# Patient Record
Sex: Male | Born: 1955 | Race: White | Hispanic: No | Marital: Married | Smoking: Never smoker
Health system: Southern US, Community
[De-identification: ages and names within clinical notes are randomized; demographics above are authoritative.]

## PROBLEM LIST (undated history)

## (undated) DIAGNOSIS — E119 Type 2 diabetes mellitus without complications: Secondary | ICD-10-CM

## (undated) DIAGNOSIS — I2510 Atherosclerotic heart disease of native coronary artery without angina pectoris: Secondary | ICD-10-CM

## (undated) DIAGNOSIS — I4729 Other ventricular tachycardia: Secondary | ICD-10-CM

## (undated) DIAGNOSIS — E785 Hyperlipidemia, unspecified: Secondary | ICD-10-CM

## (undated) DIAGNOSIS — I10 Essential (primary) hypertension: Secondary | ICD-10-CM

## (undated) DIAGNOSIS — R911 Solitary pulmonary nodule: Secondary | ICD-10-CM

## (undated) DIAGNOSIS — I472 Ventricular tachycardia: Secondary | ICD-10-CM

## (undated) DIAGNOSIS — F845 Asperger's syndrome: Secondary | ICD-10-CM

## (undated) DIAGNOSIS — C6290 Malignant neoplasm of unspecified testis, unspecified whether descended or undescended: Secondary | ICD-10-CM

## (undated) DIAGNOSIS — N189 Chronic kidney disease, unspecified: Secondary | ICD-10-CM

## (undated) DIAGNOSIS — E11319 Type 2 diabetes mellitus with unspecified diabetic retinopathy without macular edema: Secondary | ICD-10-CM

## (undated) HISTORY — PX: CARDIAC CATHETERIZATION: SHX172

## (undated) HISTORY — PX: RADICAL ORCHIECTOMY: SHX2285

## (undated) HISTORY — PX: CORONARY ARTERY BYPASS GRAFT: SHX141

---

## 2016-08-10 HISTORY — PX: COLONOSCOPY W/ BIOPSIES: SHX1374

## 2017-03-12 DIAGNOSIS — R911 Solitary pulmonary nodule: Secondary | ICD-10-CM

## 2017-03-12 HISTORY — DX: Solitary pulmonary nodule: R91.1

## 2017-04-17 DIAGNOSIS — E11319 Type 2 diabetes mellitus with unspecified diabetic retinopathy without macular edema: Secondary | ICD-10-CM

## 2017-04-17 HISTORY — DX: Type 2 diabetes mellitus with unspecified diabetic retinopathy without macular edema: E11.319

## 2017-05-29 ENCOUNTER — Telehealth (HOSPITAL_COMMUNITY): Payer: Self-pay

## 2017-05-29 NOTE — Telephone Encounter (Signed)
Attempted to call patient in regards to insurance and filling out more demographic information - lm on vm.

## 2017-06-04 ENCOUNTER — Encounter (HOSPITAL_COMMUNITY): Payer: Self-pay

## 2017-06-04 ENCOUNTER — Telehealth (HOSPITAL_COMMUNITY): Payer: Self-pay

## 2017-06-04 NOTE — Telephone Encounter (Signed)
2nd attempt to call patient to follow up in regards to insurance - lm on vm. Sending letter.

## 2017-06-06 ENCOUNTER — Telehealth (HOSPITAL_COMMUNITY): Payer: Self-pay

## 2017-06-06 NOTE — Telephone Encounter (Signed)
Patients insurance is active and benefits verified through [employer] - No co-pay, deductible amount of $6,000/$6,000 has been met, out of pocket amount of $6,400/$6,400 has been met, 40% co-insurance, and no pre-authorization is required. Passport/reference [phone]

## 2017-06-06 NOTE — Telephone Encounter (Signed)
Patient returned phone call and has insurance through [employer]. Got demographic information from patient. Explained our RN Navigator will review and will proceed with scheduling.

## 2017-06-09 ENCOUNTER — Telehealth (HOSPITAL_COMMUNITY): Payer: Self-pay

## 2017-06-09 NOTE — Telephone Encounter (Signed)
Called to speak with patient in regards to cardiologist - patient stated he will not be establishing a cardiologist here. He will be keeping Dr.Patterson in [place]. I explained to patient that he will need to set up an appt with his Cardiologist before we can schedule for CR. Patient will call back with appt date. Patient paperwork is in follow up appt folder.

## 2017-07-07 ENCOUNTER — Telehealth (HOSPITAL_COMMUNITY): Payer: Self-pay

## 2017-07-07 NOTE — Telephone Encounter (Signed)
Patient called to schedule for Cardiac Rehab - scheduled orientation on 08/14/2017 at 7:30am. Patient will attend the 6:45am exc class. Mailed packet.

## 2017-08-12 ENCOUNTER — Telehealth (HOSPITAL_COMMUNITY): Payer: Self-pay | Admitting: Pharmacist

## 2017-08-13 NOTE — Telephone Encounter (Signed)
Cardiac Rehab - Pharmacy Resident Documentation   Patient unable to be reached after three call attempts. Please complete allergy verification and medication review during patient's cardiac rehab appointment.    I did reconcile his medications from [facility]. He has amlodipine, aspirin, atorvastatin, insulin NPH and metoprolol in dispensing records, these need to be verified with the patient. Thank you!  Charlene Brooke, PharmD PGY1 Pharmacy Resident Email: [email or web address] Pager: [phone]

## 2017-08-14 ENCOUNTER — Encounter (HOSPITAL_COMMUNITY)
Admission: RE | Admit: 2017-08-14 | Discharge: 2017-08-14 | Disposition: A | Discharge status: Home / Self Care | Payer: Managed Care, Other (non HMO) | Source: Ambulatory Visit | Attending: Cardiology | Admitting: Cardiology

## 2017-08-14 ENCOUNTER — Encounter (HOSPITAL_COMMUNITY): Payer: Self-pay

## 2017-08-14 VITALS — Ht 67.5 in | Wt 181.4 lb

## 2017-08-14 DIAGNOSIS — E11319 Type 2 diabetes mellitus with unspecified diabetic retinopathy without macular edema: Secondary | ICD-10-CM | POA: Insufficient documentation

## 2017-08-14 DIAGNOSIS — Z951 Presence of aortocoronary bypass graft: Secondary | ICD-10-CM | POA: Insufficient documentation

## 2017-08-14 HISTORY — DX: Other ventricular tachycardia: I47.29

## 2017-08-14 HISTORY — DX: Hyperlipidemia, unspecified: E78.5

## 2017-08-14 HISTORY — DX: Chronic kidney disease, unspecified: N18.9

## 2017-08-14 HISTORY — DX: Type 2 diabetes mellitus without complications: E11.9

## 2017-08-14 HISTORY — DX: Type 2 diabetes mellitus with unspecified diabetic retinopathy without macular edema: E11.319

## 2017-08-14 HISTORY — DX: Asperger's syndrome: F84.5

## 2017-08-14 HISTORY — DX: Essential (primary) hypertension: I10

## 2017-08-14 HISTORY — DX: Solitary pulmonary nodule: R91.1

## 2017-08-14 HISTORY — DX: Ventricular tachycardia: I47.2

## 2017-08-14 HISTORY — DX: Atherosclerotic heart disease of native coronary artery without angina pectoris: I25.10

## 2017-08-14 NOTE — Progress Notes (Signed)
Lucas Hobbs 62 y.o. male DOB 04-Mar-1956 MRN [id number]       Nutrition  1. S/P CABG x 2    Past Medical History:  Diagnosis Date  . Asperger's syndrome   . Chronic kidney disease    CKD Stage 3 05/08/2017  . Coronary artery disease   . Diabetes mellitus without complication (HCC)    IDDM  . Diabetic retinopathy ([place])   . Diabetic retinopathy ([place])   . Diabetic retinopathy ([place]) 04/17/2017  . Hyperlipidemia    mixed  . Hypertension   . NSVT (nonsustained ventricular tachycardia) ([place])   . Pulmonary nodule 03/12/2017   Meds reviewed. Humulin noted  HT: Ht Readings from Last 1 Encounters:  08/14/17 5' 7.5" (1.715 m)    WT: Wt Readings from Last 3 Encounters:  08/14/17 181 lb 7 oz (82.3 kg)     BMI Body mass index is 28 kg/m.    Current tobacco use? No, never a smoker Labs:  Lipid Panel  No results found for: CHOL, TRIG, HDL, CHOLHDL, VLDL, LDLCALC, LDLDIRECT  No results found for: HGBA1C 05/05/17: 8.0% ([facility])  CBG (last 3)  No results for input(s): GLUCAP in the last 72 hours.  Nutrition Diagnosis ? Food-and nutrition-related knowledge deficit related to lack of exposure to information as related to diagnosis of: ? CVD ? DM  ? Overweight related to excessive energy intake as evidenced by a BMI of 28.0  Nutrition Goal(s):  ? Pt to identify food quantities necessary to achieve weight loss of 6-24 lb (2.7-10.9 kg) at graduation from cardiac rehab. 10 lb weight loss is desired. ? Pt to describe the potential benefits of adopting Therapeutic Lifestyle Changes ? CBG concentrations in the normal range or as close to normal as is safely possible. ? Improved blood glucose control as evidenced by pt's A1c trending from 8.0 toward less than 7.0.  Plan:  Pt to attend nutrition classes ? Nutrition I ? Nutrition II ? Portion Distortion ? Diabetes Blitz ? Diabetes Q & A Will provide client-centered nutrition education as part of interdisciplinary care.   Monitor  and evaluate progress toward nutrition goal with team.  Ranell Patrick, Dietetic Intern 08/14/2017 10:31 AM

## 2017-08-14 NOTE — Progress Notes (Signed)
Cardiac Individual Treatment Plan  Patient Details  Name: Lucas Hobbs MRN: [id number] Date of Birth: Aug 31, 1955 Referring Provider:     CARDIAC REHAB PHASE II ORIENTATION from 08/14/2017 in [place]  Referring Provider  Sueanne Margarita MD       Initial Encounter Date:    CARDIAC REHAB PHASE II ORIENTATION from 08/14/2017 in [facility]  Date  08/14/17  Referring Provider  Sueanne Margarita MD       Visit Diagnosis: S/P CABG x 2  Patient's Home Medications on Admission:  Current Outpatient Medications:  .  amLODipine (NORVASC) 10 MG tablet, Take 10 mg by mouth daily., Disp: , Rfl: 2 .  aspirin 81 MG chewable tablet, Chew 81 mg by mouth daily., Disp: , Rfl:  .  atorvastatin (LIPITOR) 40 MG tablet, Take 40 mg by mouth daily., Disp: , Rfl: 11 .  insulin NPH-regular Human (HUMULIN 70/30) (70-30) 100 UNIT/ML injection, Inject 36 Units into the skin daily., Disp: , Rfl:  .  metoprolol succinate (TOPROL-XL) 100 MG 24 hr tablet, Take 100 mg by mouth daily., Disp: , Rfl:   Past Medical History: Past Medical History:  Diagnosis Date  . Asperger's syndrome   . Chronic kidney disease    CKD Stage 3 05/08/2017  . Coronary artery disease   . Diabetes mellitus without complication (HCC)    IDDM  . Diabetic retinopathy ([place])   . Diabetic retinopathy ([place])   . Diabetic retinopathy ([place]) 04/17/2017  . Hyperlipidemia    mixed  . Hypertension   . NSVT (nonsustained ventricular tachycardia) ([place])   . Pulmonary nodule 03/12/2017    Tobacco Use: Social History   Tobacco Use  Smoking Status Never Smoker  Smokeless Tobacco Never Used    Labs: Recent Review Flowsheet Data    There is no flowsheet data to display.      Capillary Blood Glucose: No results found for: GLUCAP   Exercise Target Goals: Date: 08/14/17  Exercise Program Goal: Individual exercise prescription set using results from initial 6 min walk test and  THRR while considering  patient's activity barriers and safety.   Exercise Prescription Goal: Initial exercise prescription builds to 30-45 minutes a day of aerobic activity, 2-3 days per week.  Home exercise guidelines will be given to patient during program as part of exercise prescription that the participant will acknowledge.  Activity Barriers & Risk Stratification: Activity Barriers & Cardiac Risk Stratification - 08/14/17 0843      Activity Barriers & Cardiac Risk Stratification   Activity Barriers  Other (comment)    Comments  Aspergers     Cardiac Risk Stratification  High       6 Minute Walk: 6 Minute Walk    Row Name 08/14/17 0842         6 Minute Walk   Phase  Initial     Distance  1800 feet     Walk Time  6 minutes     # of Rest Breaks  0     MPH  3.41     METS  4.34     RPE  11     Perceived Dyspnea   0     VO2 Peak  15.19     Symptoms  No     Resting HR  96 bpm     Resting BP  126/68     Resting Oxygen Saturation   97 %  Exercise Oxygen Saturation  during 6 min walk  97 %     Max Ex. HR  96 bpm     Max Ex. BP  150/70     2 Minute Post BP  124/80        Oxygen Initial Assessment:   Oxygen Re-Evaluation:   Oxygen Discharge (Final Oxygen Re-Evaluation):   Initial Exercise Prescription: Initial Exercise Prescription - 08/14/17 0900      Date of Initial Exercise RX and Referring Provider   Date  08/14/17    Referring Provider  Sueanne Margarita MD       Bike   Level  1    Minutes  10    METs  3.36      NuStep   Level  2    SPM  70    Minutes  10    METs  3      Track   Laps  13    Minutes  10    METs  3.3      Prescription Details   Frequency (times per week)  3x    Duration  Progress to 30 minutes of continuous aerobic without signs/symptoms of physical distress      Intensity   THRR 40-80% of Max Heartrate  64-127    Ratings of Perceived Exertion  11-13    Perceived Dyspnea  0-4      Progression   Progression  Continue  progressive overload as per policy without signs/symptoms or physical distress.      Resistance Training   Training Prescription  Yes    Weight  3lbs    Reps  10-15       Perform Capillary Blood Glucose checks as needed.  Exercise Prescription Changes:   Exercise Comments:   Exercise Goals and Review: Exercise Goals    Row Name 08/14/17 0843             Exercise Goals   Increase Physical Activity  Yes       Intervention  Provide advice, education, support and counseling about physical activity/exercise needs.;Develop an individualized exercise prescription for aerobic and resistive training based on initial evaluation findings, risk stratification, comorbidities and participant's personal goals.       Expected Outcomes  Short Term: Attend rehab on a regular basis to increase amount of physical activity.;Long Term: Add in home exercise to make exercise part of routine and to increase amount of physical activity.;Long Term: Exercising regularly at least 3-5 days a week.       Increase Strength and Stamina  Yes       Intervention  Provide advice, education, support and counseling about physical activity/exercise needs.;Develop an individualized exercise prescription for aerobic and resistive training based on initial evaluation findings, risk stratification, comorbidities and participant's personal goals.       Expected Outcomes  Short Term: Increase workloads from initial exercise prescription for resistance, speed, and METs.;Short Term: Perform resistance training exercises routinely during rehab and add in resistance training at home;Long Term: Improve cardiorespiratory fitness, muscular endurance and strength as measured by increased METs and functional capacity (6MWT)       Able to understand and use rate of perceived exertion (RPE) scale  Yes       Intervention  Provide education and explanation on how to use RPE scale       Expected Outcomes  Short Term: Able to use RPE daily in  rehab to express subjective intensity level;Long Term:  Able to use RPE to guide intensity level when exercising independently       Knowledge and understanding of Target Heart Rate Range (THRR)  Yes       Intervention  Provide education and explanation of THRR including how the numbers were predicted and where they are located for reference       Expected Outcomes  Short Term: Able to state/look up THRR;Short Term: Able to use daily as guideline for intensity in rehab;Long Term: Able to use THRR to govern intensity when exercising independently       Able to check pulse independently  Yes       Intervention  Provide education and demonstration on how to check pulse in carotid and radial arteries.;Review the importance of being able to check your own pulse for safety during independent exercise       Expected Outcomes  Long Term: Able to check pulse independently and accurately;Short Term: Able to explain why pulse checking is important during independent exercise       Understanding of Exercise Prescription  Yes       Intervention  Provide education, explanation, and written materials on patient's individual exercise prescription       Expected Outcomes  Short Term: Able to explain program exercise prescription;Long Term: Able to explain home exercise prescription to exercise independently          Exercise Goals Re-Evaluation :    Discharge Exercise Prescription (Final Exercise Prescription Changes):   Nutrition:  Target Goals: Understanding of nutrition guidelines, daily intake of sodium 1500mg , cholesterol 200mg , calories 30% from fat and 7% or less from saturated fats, daily to have 5 or more servings of fruits and vegetables.  Biometrics: Pre Biometrics - 08/14/17 0749      Pre Biometrics   Height  5' 7.5" (1.715 m)    Weight  181 lb 7 oz (82.3 kg)    Waist Circumference  38.75 inches    Hip Circumference  38 inches    Waist to Hip Ratio  1.02 %    BMI (Calculated)  27.98     Triceps Skinfold  7 mm    % Body Fat  23.8 %    Grip Strength  39 kg    Flexibility  16.5 in    Single Leg Stand  6.77 seconds        Nutrition Therapy Plan and Nutrition Goals:   Nutrition Assessments:   Nutrition Goals Re-Evaluation:   Nutrition Goals Re-Evaluation:   Nutrition Goals Discharge (Final Nutrition Goals Re-Evaluation):   Psychosocial: Target Goals: Acknowledge presence or absence of significant depression and/or stress, maximize coping skills, provide positive support system. Participant is able to verbalize types and ability to use techniques and skills needed for reducing stress and depression.  Initial Review & Psychosocial Screening: Initial Psych Review & Screening - 08/14/17 1010      Initial Review   Current issues with  None Identified      Family Dynamics   Good Support System?  Yes Pt states that his family is his support system.  He is married and "married into a large family."      Barriers   Psychosocial barriers to participate in program  There are no identifiable barriers or psychosocial needs.      Screening Interventions   Interventions  Encouraged to exercise;Provide feedback about the scores to participant    Expected Outcomes  Short Term goal: Identification and review with participant of any Quality of  Life or Depression concerns found by scoring the questionnaire.;Long Term goal: The participant improves quality of Life and PHQ9 Scores as seen by post scores and/or verbalization of changes       Quality of Life Scores: Quality of Life - 08/14/17 0858      Quality of Life Scores   Health/Function Pre  18 %    Socioeconomic Pre  18.31 %    Psych/Spiritual Pre  15.5 %    Family Pre  20.1 %    GLOBAL Pre  17.87 %      Scores of 19 and below usually indicate a poorer quality of life in these areas.  A difference of  2-3 points is a clinically meaningful difference.  A difference of 2-3 points in the total score of the Quality of  Life Index has been associated with significant improvement in overall quality of life, self-image, physical symptoms, and general health in studies assessing change in quality of life.  PHQ-9: Recent Review Flowsheet Data    There is no flowsheet data to display.     Interpretation of Total Score  Total Score Depression Severity:  1-4 = Minimal depression, 5-9 = Mild depression, 10-14 = Moderate depression, 15-19 = Moderately severe depression, 20-27 = Severe depression   Psychosocial Evaluation and Intervention:   Psychosocial Re-Evaluation:   Psychosocial Discharge (Final Psychosocial Re-Evaluation):   Vocational Rehabilitation: Provide vocational rehab assistance to qualifying candidates.   Vocational Rehab Evaluation & Intervention: Vocational Rehab - 08/14/17 1003      Initial Vocational Rehab Evaluation & Intervention   Assessment shows need for Vocational Rehabilitation  No       Education: Education Goals: Education classes will be provided on a weekly basis, covering required topics. Participant will state understanding/return demonstration of topics presented.  Learning Barriers/Preferences: Learning Barriers/Preferences - 08/14/17 0747      Learning Barriers/Preferences   Learning Barriers  -- Aspergers       Education Topics: Count Your Pulse:  -Group instruction provided by verbal instruction, demonstration, patient participation and written materials to support subject.  Instructors address importance of being able to find your pulse and how to count your pulse when at home without a heart monitor.  Patients get hands on experience counting their pulse with staff help and individually.   Heart Attack, Angina, and Risk Factor Modification:  -Group instruction provided by verbal instruction, video, and written materials to support subject.  Instructors address signs and symptoms of angina and heart attacks.    Also discuss risk factors for heart disease  and how to make changes to improve heart health risk factors.   Functional Fitness:  -Group instruction provided by verbal instruction, demonstration, patient participation, and written materials to support subject.  Instructors address safety measures for doing things around the house.  Discuss how to get up and down off the floor, how to pick things up properly, how to safely get out of a chair without assistance, and balance training.   Meditation and Mindfulness:  -Group instruction provided by verbal instruction, patient participation, and written materials to support subject.  Instructor addresses importance of mindfulness and meditation practice to help reduce stress and improve awareness.  Instructor also leads participants through a meditation exercise.    Stretching for Flexibility and Mobility:  -Group instruction provided by verbal instruction, patient participation, and written materials to support subject.  Instructors lead participants through series of stretches that are designed to increase flexibility thus improving mobility.  These stretches  are additional exercise for major muscle groups that are typically performed during regular warm up and cool down.   Hands Only CPR:  -Group verbal, video, and participation provides a basic overview of AHA guidelines for community CPR. Role-play of emergencies allow participants the opportunity to practice calling for help and chest compression technique with discussion of AED use.   Hypertension: -Group verbal and written instruction that provides a basic overview of hypertension including the most recent diagnostic guidelines, risk factor reduction with self-care instructions and medication management.    Nutrition I class: Heart Healthy Eating:  -Group instruction provided by PowerPoint slides, verbal discussion, and written materials to support subject matter. The instructor gives an explanation and review of the Therapeutic  Lifestyle Changes diet recommendations, which includes a discussion on lipid goals, dietary fat, sodium, fiber, plant stanol/sterol esters, sugar, and the components of a well-balanced, healthy diet.   Nutrition II class: Lifestyle Skills:  -Group instruction provided by PowerPoint slides, verbal discussion, and written materials to support subject matter. The instructor gives an explanation and review of label reading, grocery shopping for heart health, heart healthy recipe modifications, and ways to make healthier choices when eating out.   Diabetes Question & Answer:  -Group instruction provided by PowerPoint slides, verbal discussion, and written materials to support subject matter. The instructor gives an explanation and review of diabetes co-morbidities, pre- and post-prandial blood glucose goals, pre-exercise blood glucose goals, signs, symptoms, and treatment of hypoglycemia and hyperglycemia, and foot care basics.   Diabetes Blitz:  -Group instruction provided by PowerPoint slides, verbal discussion, and written materials to support subject matter. The instructor gives an explanation and review of the physiology behind type 1 and type 2 diabetes, diabetes medications and rational behind using different medications, pre- and post-prandial blood glucose recommendations and Hemoglobin A1c goals, diabetes diet, and exercise including blood glucose guidelines for exercising safely.    Portion Distortion:  -Group instruction provided by PowerPoint slides, verbal discussion, written materials, and food models to support subject matter. The instructor gives an explanation of serving size versus portion size, changes in portions sizes over the last 20 years, and what consists of a serving from each food group.   Stress Management:  -Group instruction provided by verbal instruction, video, and written materials to support subject matter.  Instructors review role of stress in heart disease and how  to cope with stress positively.     Exercising on Your Own:  -Group instruction provided by verbal instruction, power point, and written materials to support subject.  Instructors discuss benefits of exercise, components of exercise, frequency and intensity of exercise, and end points for exercise.  Also discuss use of nitroglycerin and activating EMS.  Review options of places to exercise outside of rehab.  Review guidelines for sex with heart disease.   Cardiac Drugs I:  -Group instruction provided by verbal instruction and written materials to support subject.  Instructor reviews cardiac drug classes: antiplatelets, anticoagulants, beta blockers, and statins.  Instructor discusses reasons, side effects, and lifestyle considerations for each drug class.   Cardiac Drugs II:  -Group instruction provided by verbal instruction and written materials to support subject.  Instructor reviews cardiac drug classes: angiotensin converting enzyme inhibitors (ACE-I), angiotensin II receptor blockers (ARBs), nitrates, and calcium channel blockers.  Instructor discusses reasons, side effects, and lifestyle considerations for each drug class.   Anatomy and Physiology of the Circulatory System:  Group verbal and written instruction and models provide basic cardiac anatomy and physiology,  with the coronary electrical and arterial systems. Review of: AMI, Angina, Valve disease, Heart Failure, Peripheral Artery Disease, Cardiac Arrhythmia, Pacemakers, and the ICD.   Other Education:  -Group or individual verbal, written, or video instructions that support the educational goals of the cardiac rehab program.   Holiday Eating Survival Tips:  -Group instruction provided by PowerPoint slides, verbal discussion, and written materials to support subject matter. The instructor gives patients tips, tricks, and techniques to help them not only survive but enjoy the holidays despite the onslaught of food that accompanies  the holidays.   Knowledge Questionnaire Score: Knowledge Questionnaire Score - 08/14/17 0858      Knowledge Questionnaire Score   Pre Score  21/24       Core Components/Risk Factors/Patient Goals at Admission: Personal Goals and Risk Factors at Admission - 08/14/17 0844      Core Components/Risk Factors/Patient Goals on Admission    Weight Management  Yes;Weight Loss    Intervention  Weight Management: Develop a combined nutrition and exercise program designed to reach desired caloric intake, while maintaining appropriate intake of nutrient and fiber, sodium and fats, and appropriate energy expenditure required for the weight goal.;Weight Management: Provide education and appropriate resources to help participant work on and attain dietary goals.;Weight Management/Obesity: Establish reasonable short term and long term weight goals.    Admit Weight  181 lb 1 oz (82.1 kg)    Goal Weight: Short Term  176 lb (79.8 kg)    Goal Weight: Long Term  170 lb (77.1 kg)    Expected Outcomes  Short Term: Continue to assess and modify interventions until short term weight is achieved;Long Term: Adherence to nutrition and physical activity/exercise program aimed toward attainment of established weight goal;Weight Loss: Understanding of general recommendations for a balanced deficit meal plan, which promotes 1-2 lb weight loss per week and includes a negative energy balance of [phone] kcal/d;Understanding recommendations for meals to include 15-35% energy as protein, 25-35% energy from fat, 35-60% energy from carbohydrates, less than 200mg  of dietary cholesterol, 20-35 gm of total fiber daily;Understanding of distribution of calorie intake throughout the day with the consumption of 4-5 meals/snacks    Diabetes  Yes    Intervention  Provide education about signs/symptoms and action to take for hypo/hyperglycemia.;Provide education about proper nutrition, including hydration, and aerobic/resistive exercise  prescription along with prescribed medications to achieve blood glucose in normal ranges: Fasting glucose 65-99 mg/dL    Expected Outcomes  Short Term: Participant verbalizes understanding of the signs/symptoms and immediate care of hyper/hypoglycemia, proper foot care and importance of medication, aerobic/resistive exercise and nutrition plan for blood glucose control.;Long Term: Attainment of HbA1C < 7%.    Hypertension  Yes    Intervention  Provide education on lifestyle modifcations including regular physical activity/exercise, weight management, moderate sodium restriction and increased consumption of fresh fruit, vegetables, and low fat dairy, alcohol moderation, and smoking cessation.;Monitor prescription use compliance.    Expected Outcomes  Short Term: Continued assessment and intervention until BP is < 140/64mm HG in hypertensive participants. < 130/87mm HG in hypertensive participants with diabetes, heart failure or chronic kidney disease.;Long Term: Maintenance of blood pressure at goal levels.    Lipids  Yes    Intervention  Provide education and support for participant on nutrition & aerobic/resistive exercise along with prescribed medications to achieve LDL 70mg , HDL >40mg .    Expected Outcomes  Short Term: Participant states understanding of desired cholesterol values and is compliant with medications prescribed. Participant is  following exercise prescription and nutrition guidelines.;Long Term: Cholesterol controlled with medications as prescribed, with individualized exercise RX and with personalized nutrition plan. Value goals: LDL < 70mg , HDL > 40 mg.       Core Components/Risk Factors/Patient Goals Review:    Core Components/Risk Factors/Patient Goals at Discharge (Final Review):    ITP Comments: ITP Comments    Row Name 08/14/17 0736           ITP Comments  Dr. Fransico Him, Medical Director           Comments: Patient attended orientation from [phone] to [phone] to  review rules and guidelines for program. Completed 6 minute walk test, Intitial ITP, and exercise prescription.  VSS. Telemetry-SR with PAC's.  Asymptomatic. Pt tolerated exercise well.  No psychosocial needs identified. No interventions necessary.

## 2017-08-14 NOTE — Progress Notes (Signed)
Cardiac Rehab Medication Review by a RN  Does the patient  feel that his/her medications are working for him/her?  yes  Has the patient been experiencing any side effects to the medications prescribed?  no  Does the patient measure his/her own blood pressure or blood glucose at home?  Pt checks his blood sugar twice a day. He does not check his BP.    Does the patient have any problems obtaining medications due to transportation or finances?   no  Understanding of regimen: good Understanding of indications: good Potential of compliance: good   Lucas Hobbs 08/14/2017 8:01 AM

## 2017-08-18 ENCOUNTER — Encounter (HOSPITAL_COMMUNITY)
Admission: RE | Admit: 2017-08-18 | Discharge: 2017-08-18 | Disposition: A | Discharge status: Home / Self Care | Payer: Managed Care, Other (non HMO) | Source: Ambulatory Visit | Attending: Cardiology | Admitting: Cardiology

## 2017-08-18 DIAGNOSIS — Z951 Presence of aortocoronary bypass graft: Secondary | ICD-10-CM | POA: Diagnosis not present

## 2017-08-18 LAB — GLUCOSE, CAPILLARY: GLUCOSE-CAPILLARY: 266 mg/dL — AB (ref 65–99)

## 2017-08-18 NOTE — Progress Notes (Signed)
Daily Session Note  Patient Details  Name: Lucas Hobbs MRN: [id number] Date of Birth: 26-Feb-1956 Referring Provider:     CARDIAC REHAB PHASE II ORIENTATION from 08/14/2017 in [place]  Referring Provider  Sueanne Margarita MD       Encounter Date: 08/18/2017  Check In: Session Check In - 08/18/17 0734      Check-In   Location  [facility]    Staff Present  Maurice Small, RN, BSN;Ramon Dredge, RN, MHA;Tyara Carol Ada, MS,ACSM CEP, Exercise Physiologist;Other    Supervising physician immediately available to respond to emergencies  [facility] immediately available    Physician(s)  Dr. Alfredia Ferguson    Medication changes reported      No    Fall or balance concerns reported     No    Tobacco Cessation  No Change    Warm-up and Cool-down  Performed as group-led instruction    Resistance Training Performed  Yes    VAD Patient?  No      Pain Assessment   Currently in Pain?  No/denies    Multiple Pain Sites  No       Capillary Blood Glucose: Results for orders placed or performed during the hospital encounter of 08/18/17 (from the past 24 hour(s))  Glucose, capillary     Status: Abnormal   Collection Time: 08/18/17  6:48 AM  Result Value Ref Range   Glucose-Capillary 266 (H) 65 - 99 mg/dL    Exercise Prescription Changes - 08/18/17 1000      Response to Exercise   Blood Pressure (Admit)  138/86    Blood Pressure (Exercise)  180/90 Recheck 140/80    Blood Pressure (Exit)  142/70    Heart Rate (Admit)  77 bpm    Heart Rate (Exercise)  123 bpm    Heart Rate (Exit)  70 bpm    Rating of Perceived Exertion (Exercise)  12    Perceived Dyspnea (Exercise)  0    Symptoms  Elevated BP  Pt did not take PM dose of Metoprolol     Comments  Pt oriented to exercise equipment    Duration  Progress to 30 minutes of  aerobic without signs/symptoms of physical distress    Intensity  THRR New      Progression   Progression  Continue  to progress workloads to maintain intensity without signs/symptoms of physical distress.    Average METs  3.15      Resistance Training   Training Prescription  Yes    Weight  1lbs Decrease in weight due to elevated BP    Reps  10-15    Time  10 Minutes      Interval Training   Interval Training  No      Bike   Level  1    Minutes  10    METs  3.29      Track   Laps  13    Minutes  10    METs  3       Social History   Tobacco Use  Smoking Status Never Smoker  Smokeless Tobacco Never Used    Goals Met:  Exercise tolerated well  Goals Unmet:  BP Pt did not take PM dose of Lopressor. His BP was 180/90 with exercise on the stationary bike.  Pt switched to walking on the track with improvements in his BP.   Comments: Pt started cardiac rehab today.  Pt tolerated light  exercise without difficulty. VSS, telemetry-SR, asymptomatic.  Medication list not reconciled today.  Will be addressed at next session. PSYCHOSOCIAL ASSESSMENT:  Pt exhibits positive coping skills, hopeful outlook with supportive family. No psychosocial needs identified at this time, no psychosocial interventions necessary.  Pt oriented to exercise equipment and routine.  Understanding verbalized.    Dr. Fransico Him is Medical Director for Cardiac Rehab at [facility].

## 2017-08-20 ENCOUNTER — Encounter (HOSPITAL_COMMUNITY)
Admission: RE | Admit: 2017-08-20 | Discharge: 2017-08-20 | Disposition: A | Discharge status: Home / Self Care | Payer: Managed Care, Other (non HMO) | Source: Ambulatory Visit | Attending: Cardiology | Admitting: Cardiology

## 2017-08-20 ENCOUNTER — Encounter (HOSPITAL_COMMUNITY): Payer: Self-pay

## 2017-08-20 DIAGNOSIS — Z951 Presence of aortocoronary bypass graft: Secondary | ICD-10-CM

## 2017-08-20 LAB — GLUCOSE, CAPILLARY: Glucose-Capillary: 132 mg/dL — ABNORMAL HIGH (ref 65–99)

## 2017-08-20 NOTE — Progress Notes (Signed)
Cardiac Individual Treatment Plan  Patient Details  Name: Lucas Hobbs MRN: [id number] Date of Birth: 01/05/1956 Referring Provider:     CARDIAC REHAB PHASE II ORIENTATION from 08/14/2017 in [place]  Referring Provider  Sueanne Margarita MD       Initial Encounter Date:    CARDIAC REHAB PHASE II ORIENTATION from 08/14/2017 in [place]  Date  08/14/17  Referring Provider  Sueanne Margarita MD       Visit Diagnosis: S/P CABG x 2  Patient's Home Medications on Admission:  Current Outpatient Medications:  .  amLODipine (NORVASC) 10 MG tablet, Take 10 mg by mouth daily., Disp: , Rfl: 2 .  aspirin 81 MG chewable tablet, Chew 81 mg by mouth daily., Disp: , Rfl:  .  atorvastatin (LIPITOR) 40 MG tablet, Take 40 mg by mouth daily., Disp: , Rfl: 11 .  insulin NPH-regular Human (HUMULIN 70/30) (70-30) 100 UNIT/ML injection, Inject 36 Units into the skin daily., Disp: , Rfl:  .  metoprolol succinate (TOPROL-XL) 100 MG 24 hr tablet, Take 100 mg by mouth daily., Disp: , Rfl:   Past Medical History: Past Medical History:  Diagnosis Date  . Asperger's syndrome   . Chronic kidney disease    CKD Stage 3 05/08/2017  . Coronary artery disease   . Diabetes mellitus without complication (HCC)    IDDM  . Diabetic retinopathy ([place])   . Diabetic retinopathy ([place])   . Diabetic retinopathy ([place]) 04/17/2017  . Hyperlipidemia    mixed  . Hypertension   . NSVT (nonsustained ventricular tachycardia) ([place])   . Pulmonary nodule 03/12/2017    Tobacco Use: Social History   Tobacco Use  Smoking Status Never Smoker  Smokeless Tobacco Never Used    Labs: Recent Review Flowsheet Data    There is no flowsheet data to display.      Capillary Blood Glucose: Lab Results  Component Value Date   GLUCAP 132 (H) 08/20/2017   GLUCAP 266 (H) 08/18/2017     Exercise Target Goals:    Exercise Program Goal: Individual exercise  prescription set using results from initial 6 min walk test and THRR while considering  patient's activity barriers and safety.   Exercise Prescription Goal: Initial exercise prescription builds to 30-45 minutes a day of aerobic activity, 2-3 days per week.  Home exercise guidelines will be given to patient during program as part of exercise prescription that the participant will acknowledge.  Activity Barriers & Risk Stratification: Activity Barriers & Cardiac Risk Stratification - 08/14/17 0843      Activity Barriers & Cardiac Risk Stratification   Activity Barriers  Other (comment)    Comments  Aspergers     Cardiac Risk Stratification  High       6 Minute Walk: 6 Minute Walk    Row Name 08/14/17 0842         6 Minute Walk   Phase  Initial     Distance  1800 feet     Walk Time  6 minutes     # of Rest Breaks  0     MPH  3.41     METS  4.34     RPE  11     Perceived Dyspnea   0     VO2 Peak  15.19     Symptoms  No     Resting HR  96 bpm     Resting BP  126/68  Resting Oxygen Saturation   97 %     Exercise Oxygen Saturation  during 6 min walk  97 %     Max Ex. HR  96 bpm     Max Ex. BP  150/70     2 Minute Post BP  124/80        Oxygen Initial Assessment:   Oxygen Re-Evaluation:   Oxygen Discharge (Final Oxygen Re-Evaluation):   Initial Exercise Prescription: Initial Exercise Prescription - 08/14/17 0900      Date of Initial Exercise RX and Referring Provider   Date  08/14/17    Referring Provider  Sueanne Margarita MD       Bike   Level  1    Minutes  10    METs  3.36      NuStep   Level  2    SPM  70    Minutes  10    METs  3      Track   Laps  13    Minutes  10    METs  3.3      Prescription Details   Frequency (times per week)  3x    Duration  Progress to 30 minutes of continuous aerobic without signs/symptoms of physical distress      Intensity   THRR 40-80% of Max Heartrate  64-127    Ratings of Perceived Exertion  11-13     Perceived Dyspnea  0-4      Progression   Progression  Continue progressive overload as per policy without signs/symptoms or physical distress.      Resistance Training   Training Prescription  Yes    Weight  3lbs    Reps  10-15       Perform Capillary Blood Glucose checks as needed.  Exercise Prescription Changes: Exercise Prescription Changes    Row Name 08/18/17 1000             Response to Exercise   Blood Pressure (Admit)  138/86       Blood Pressure (Exercise)  180/90 Recheck 140/80       Blood Pressure (Exit)  142/70       Heart Rate (Admit)  77 bpm       Heart Rate (Exercise)  123 bpm       Heart Rate (Exit)  70 bpm       Rating of Perceived Exertion (Exercise)  12       Perceived Dyspnea (Exercise)  0       Symptoms  Elevated BP  Pt did not take PM dose of Metoprolol        Comments  Pt oriented to exercise equipment       Duration  Progress to 30 minutes of  aerobic without signs/symptoms of physical distress       Intensity  THRR New         Progression   Progression  Continue to progress workloads to maintain intensity without signs/symptoms of physical distress.       Average METs  3.15         Resistance Training   Training Prescription  Yes       Weight  1lbs Decrease in weight due to elevated BP       Reps  10-15       Time  10 Minutes         Interval Training   Interval Training  No  Bike   Level  1       Minutes  10       METs  3.29         Track   Laps  13       Minutes  10       METs  3          Exercise Comments: Exercise Comments    Row Name 08/18/17 1046 08/18/17 1049 08/18/17 1054       Exercise Comments  Pt's first day of rehab went fairly well. Pt did not take all prescribed meds, pt's BP was elevated. Pt's was altered to allow BP to come down. Will continue to work with pt and monitor pts progress.   Pt's first day of rehab went fairly well. Pt did not take all prescribed meds, pt's BP was elevated. Pt's exercise  prescription was altered to allow BP to come down. Will continue to work with pt and monitor pts progress.   Pt's first day of rehab went fairly well. Pt oriented to exercise equipment. Pt did not take all prescribed meds, pt's BP was elevated. Pt's exercise prescription was altered to allow BP to come down. Will continue to work with pt and monitor pts progress.         Exercise Goals and Review: Exercise Goals    Row Name 08/14/17 0843             Exercise Goals   Increase Physical Activity  Yes       Intervention  Provide advice, education, support and counseling about physical activity/exercise needs.;Develop an individualized exercise prescription for aerobic and resistive training based on initial evaluation findings, risk stratification, comorbidities and participant's personal goals.       Expected Outcomes  Short Term: Attend rehab on a regular basis to increase amount of physical activity.;Long Term: Add in home exercise to make exercise part of routine and to increase amount of physical activity.;Long Term: Exercising regularly at least 3-5 days a week.       Increase Strength and Stamina  Yes       Intervention  Provide advice, education, support and counseling about physical activity/exercise needs.;Develop an individualized exercise prescription for aerobic and resistive training based on initial evaluation findings, risk stratification, comorbidities and participant's personal goals.       Expected Outcomes  Short Term: Increase workloads from initial exercise prescription for resistance, speed, and METs.;Short Term: Perform resistance training exercises routinely during rehab and add in resistance training at home;Long Term: Improve cardiorespiratory fitness, muscular endurance and strength as measured by increased METs and functional capacity (6MWT)       Able to understand and use rate of perceived exertion (RPE) scale  Yes       Intervention  Provide education and explanation  on how to use RPE scale       Expected Outcomes  Short Term: Able to use RPE daily in rehab to express subjective intensity level;Long Term:  Able to use RPE to guide intensity level when exercising independently       Knowledge and understanding of Target Heart Rate Range (THRR)  Yes       Intervention  Provide education and explanation of THRR including how the numbers were predicted and where they are located for reference       Expected Outcomes  Short Term: Able to state/look up THRR;Short Term: Able to use daily as guideline for intensity in rehab;Long Term: Able  to use THRR to govern intensity when exercising independently       Able to check pulse independently  Yes       Intervention  Provide education and demonstration on how to check pulse in carotid and radial arteries.;Review the importance of being able to check your own pulse for safety during independent exercise       Expected Outcomes  Long Term: Able to check pulse independently and accurately;Short Term: Able to explain why pulse checking is important during independent exercise       Understanding of Exercise Prescription  Yes       Intervention  Provide education, explanation, and written materials on patient's individual exercise prescription       Expected Outcomes  Short Term: Able to explain program exercise prescription;Long Term: Able to explain home exercise prescription to exercise independently          Exercise Goals Re-Evaluation :    Discharge Exercise Prescription (Final Exercise Prescription Changes): Exercise Prescription Changes - 08/18/17 1000      Response to Exercise   Blood Pressure (Admit)  138/86    Blood Pressure (Exercise)  180/90 Recheck 140/80    Blood Pressure (Exit)  142/70    Heart Rate (Admit)  77 bpm    Heart Rate (Exercise)  123 bpm    Heart Rate (Exit)  70 bpm    Rating of Perceived Exertion (Exercise)  12    Perceived Dyspnea (Exercise)  0    Symptoms  Elevated BP  Pt did not take  PM dose of Metoprolol     Comments  Pt oriented to exercise equipment    Duration  Progress to 30 minutes of  aerobic without signs/symptoms of physical distress    Intensity  THRR New      Progression   Progression  Continue to progress workloads to maintain intensity without signs/symptoms of physical distress.    Average METs  3.15      Resistance Training   Training Prescription  Yes    Weight  1lbs Decrease in weight due to elevated BP    Reps  10-15    Time  10 Minutes      Interval Training   Interval Training  No      Bike   Level  1    Minutes  10    METs  3.29      Track   Laps  13    Minutes  10    METs  3       Nutrition:  Target Goals: Understanding of nutrition guidelines, daily intake of sodium 1500mg , cholesterol 200mg , calories 30% from fat and 7% or less from saturated fats, daily to have 5 or more servings of fruits and vegetables.  Biometrics: Pre Biometrics - 08/14/17 0749      Pre Biometrics   Height  5' 7.5" (1.715 m)    Weight  181 lb 7 oz (82.3 kg)    Waist Circumference  38.75 inches    Hip Circumference  38 inches    Waist to Hip Ratio  1.02 %    BMI (Calculated)  27.98    Triceps Skinfold  7 mm    % Body Fat  23.8 %    Grip Strength  39 kg    Flexibility  16.5 in    Single Leg Stand  6.77 seconds        Nutrition Therapy Plan and Nutrition Goals: Nutrition Therapy & Goals -  08/14/17 1042      Nutrition Therapy   Diet  Heart Healthy, Carbohydrate Controlled      Personal Nutrition Goals   Nutrition Goal  Pt to identify food quantities necessary to achieve weight loss of 6-10 lb at graduation from cardiac rehab.    Personal Goal #2  Pt to describe the potential benefits of adopting Therapeutic Lifestyle Changes    Personal Goal #3  Improved blood glucose control as evidenced by pt's A1c trending from 8.0 toward less than 7.0.    Personal Goal #4  --      Intervention Plan   Intervention  Prescribe, educate and counsel  regarding individualized specific dietary modifications aiming towards targeted core components such as weight, hypertension, lipid management, diabetes, heart failure and other comorbidities.    Expected Outcomes  Short Term Goal: Understand basic principles of dietary content, such as calories, fat, sodium, cholesterol and nutrients.;Long Term Goal: Adherence to prescribed nutrition plan.       Nutrition Assessments: Nutrition Assessments - 08/14/17 1044      MEDFICTS Scores   Pre Score  30       Nutrition Goals Re-Evaluation:   Nutrition Goals Re-Evaluation:   Nutrition Goals Discharge (Final Nutrition Goals Re-Evaluation):   Psychosocial: Target Goals: Acknowledge presence or absence of significant depression and/or stress, maximize coping skills, provide positive support system. Participant is able to verbalize types and ability to use techniques and skills needed for reducing stress and depression.  Initial Review & Psychosocial Screening: Initial Psych Review & Screening - 08/14/17 1010      Initial Review   Current issues with  None Identified      Family Dynamics   Good Support System?  Yes Pt states that his family is his support system.  He is married and "married into a large family."      Barriers   Psychosocial barriers to participate in program  There are no identifiable barriers or psychosocial needs.      Screening Interventions   Interventions  Encouraged to exercise;Provide feedback about the scores to participant    Expected Outcomes  Short Term goal: Identification and review with participant of any Quality of Life or Depression concerns found by scoring the questionnaire.;Long Term goal: The participant improves quality of Life and PHQ9 Scores as seen by post scores and/or verbalization of changes       Quality of Life Scores: Quality of Life - 08/14/17 0858      Quality of Life Scores   Health/Function Pre  18 %    Socioeconomic Pre  18.31 %     Psych/Spiritual Pre  15.5 %    Family Pre  20.1 %    GLOBAL Pre  17.87 %      Scores of 19 and below usually indicate a poorer quality of life in these areas.  A difference of  2-3 points is a clinically meaningful difference.  A difference of 2-3 points in the total score of the Quality of Life Index has been associated with significant improvement in overall quality of life, self-image, physical symptoms, and general health in studies assessing change in quality of life.  PHQ-9: Recent Review Flowsheet Data    Depression screen [facility] 2/9 08/20/2017   Decreased Interest 0   Down, Depressed, Hopeless 0   PHQ - 2 Score 0     Interpretation of Total Score  Total Score Depression Severity:  1-4 = Minimal depression, 5-9 = Mild depression, 10-14 =  Moderate depression, 15-19 = Moderately severe depression, 20-27 = Severe depression   Psychosocial Evaluation and Intervention: Psychosocial Evaluation - 08/18/17 1545      Psychosocial Evaluation & Interventions   Interventions  Encouraged to exercise with the program and follow exercise prescription    Comments  No psychosocial needs identified. No interventions necessary.     Expected Outcomes  Pt will exhibit a positive outlook with good coping skills.     Continue Psychosocial Services   No Follow up required       Psychosocial Re-Evaluation: Psychosocial Re-Evaluation    [place] Name 08/20/17 1645             Psychosocial Re-Evaluation   Current issues with  None Identified       Comments  No psychosocial needs identified. No interventions necessary.        Expected Outcomes  Pt will continie to exhibit a positive outlook with good coping skills.        Interventions  Encouraged to attend Cardiac Rehabilitation for the exercise       Continue Psychosocial Services   No Follow up required          Psychosocial Discharge (Final Psychosocial Re-Evaluation): Psychosocial Re-Evaluation - 08/20/17 1645      Psychosocial Re-Evaluation    Current issues with  None Identified    Comments  No psychosocial needs identified. No interventions necessary.     Expected Outcomes  Pt will continie to exhibit a positive outlook with good coping skills.     Interventions  Encouraged to attend Cardiac Rehabilitation for the exercise    Continue Psychosocial Services   No Follow up required       Vocational Rehabilitation: Provide vocational rehab assistance to qualifying candidates.   Vocational Rehab Evaluation & Intervention: Vocational Rehab - 08/14/17 1003      Initial Vocational Rehab Evaluation & Intervention   Assessment shows need for Vocational Rehabilitation  No       Education: Education Goals: Education classes will be provided on a weekly basis, covering required topics. Participant will state understanding/return demonstration of topics presented.  Learning Barriers/Preferences: Learning Barriers/Preferences - 08/14/17 0747      Learning Barriers/Preferences   Learning Barriers  -- Aspergers       Education Topics: Count Your Pulse:  -Group instruction provided by verbal instruction, demonstration, patient participation and written materials to support subject.  Instructors address importance of being able to find your pulse and how to count your pulse when at home without a heart monitor.  Patients get hands on experience counting their pulse with staff help and individually.   Heart Attack, Angina, and Risk Factor Modification:  -Group instruction provided by verbal instruction, video, and written materials to support subject.  Instructors address signs and symptoms of angina and heart attacks.    Also discuss risk factors for heart disease and how to make changes to improve heart health risk factors.   Functional Fitness:  -Group instruction provided by verbal instruction, demonstration, patient participation, and written materials to support subject.  Instructors address safety measures for doing things  around the house.  Discuss how to get up and down off the floor, how to pick things up properly, how to safely get out of a chair without assistance, and balance training.   Meditation and Mindfulness:  -Group instruction provided by verbal instruction, patient participation, and written materials to support subject.  Instructor addresses importance of mindfulness and meditation practice to help reduce  stress and improve awareness.  Instructor also leads participants through a meditation exercise.    Stretching for Flexibility and Mobility:  -Group instruction provided by verbal instruction, patient participation, and written materials to support subject.  Instructors lead participants through series of stretches that are designed to increase flexibility thus improving mobility.  These stretches are additional exercise for major muscle groups that are typically performed during regular warm up and cool down.   Hands Only CPR:  -Group verbal, video, and participation provides a basic overview of AHA guidelines for community CPR. Role-play of emergencies allow participants the opportunity to practice calling for help and chest compression technique with discussion of AED use.   Hypertension: -Group verbal and written instruction that provides a basic overview of hypertension including the most recent diagnostic guidelines, risk factor reduction with self-care instructions and medication management.    Nutrition I class: Heart Healthy Eating:  -Group instruction provided by PowerPoint slides, verbal discussion, and written materials to support subject matter. The instructor gives an explanation and review of the Therapeutic Lifestyle Changes diet recommendations, which includes a discussion on lipid goals, dietary fat, sodium, fiber, plant stanol/sterol esters, sugar, and the components of a well-balanced, healthy diet.   Nutrition II class: Lifestyle Skills:  -Group instruction provided by  PowerPoint slides, verbal discussion, and written materials to support subject matter. The instructor gives an explanation and review of label reading, grocery shopping for heart health, heart healthy recipe modifications, and ways to make healthier choices when eating out.   Diabetes Question & Answer:  -Group instruction provided by PowerPoint slides, verbal discussion, and written materials to support subject matter. The instructor gives an explanation and review of diabetes co-morbidities, pre- and post-prandial blood glucose goals, pre-exercise blood glucose goals, signs, symptoms, and treatment of hypoglycemia and hyperglycemia, and foot care basics.   Diabetes Blitz:  -Group instruction provided by PowerPoint slides, verbal discussion, and written materials to support subject matter. The instructor gives an explanation and review of the physiology behind type 1 and type 2 diabetes, diabetes medications and rational behind using different medications, pre- and post-prandial blood glucose recommendations and Hemoglobin A1c goals, diabetes diet, and exercise including blood glucose guidelines for exercising safely.    Portion Distortion:  -Group instruction provided by PowerPoint slides, verbal discussion, written materials, and food models to support subject matter. The instructor gives an explanation of serving size versus portion size, changes in portions sizes over the last 20 years, and what consists of a serving from each food group.   Stress Management:  -Group instruction provided by verbal instruction, video, and written materials to support subject matter.  Instructors review role of stress in heart disease and how to cope with stress positively.     Exercising on Your Own:  -Group instruction provided by verbal instruction, power point, and written materials to support subject.  Instructors discuss benefits of exercise, components of exercise, frequency and intensity of exercise,  and end points for exercise.  Also discuss use of nitroglycerin and activating EMS.  Review options of places to exercise outside of rehab.  Review guidelines for sex with heart disease.   Cardiac Drugs I:  -Group instruction provided by verbal instruction and written materials to support subject.  Instructor reviews cardiac drug classes: antiplatelets, anticoagulants, beta blockers, and statins.  Instructor discusses reasons, side effects, and lifestyle considerations for each drug class.   Cardiac Drugs II:  -Group instruction provided by verbal instruction and written materials to support subject.  Instructor reviews cardiac drug classes: angiotensin converting enzyme inhibitors (ACE-I), angiotensin II receptor blockers (ARBs), nitrates, and calcium channel blockers.  Instructor discusses reasons, side effects, and lifestyle considerations for each drug class.   Anatomy and Physiology of the Circulatory System:  Group verbal and written instruction and models provide basic cardiac anatomy and physiology, with the coronary electrical and arterial systems. Review of: AMI, Angina, Valve disease, Heart Failure, Peripheral Artery Disease, Cardiac Arrhythmia, Pacemakers, and the ICD.   Other Education:  -Group or individual verbal, written, or video instructions that support the educational goals of the cardiac rehab program.   Holiday Eating Survival Tips:  -Group instruction provided by PowerPoint slides, verbal discussion, and written materials to support subject matter. The instructor gives patients tips, tricks, and techniques to help them not only survive but enjoy the holidays despite the onslaught of food that accompanies the holidays.   Knowledge Questionnaire Score: Knowledge Questionnaire Score - 08/14/17 0858      Knowledge Questionnaire Score   Pre Score  21/24       Core Components/Risk Factors/Patient Goals at Admission: Personal Goals and Risk Factors at Admission -  08/14/17 0844      Core Components/Risk Factors/Patient Goals on Admission    Weight Management  Yes;Weight Loss    Intervention  Weight Management: Develop a combined nutrition and exercise program designed to reach desired caloric intake, while maintaining appropriate intake of nutrient and fiber, sodium and fats, and appropriate energy expenditure required for the weight goal.;Weight Management: Provide education and appropriate resources to help participant work on and attain dietary goals.;Weight Management/Obesity: Establish reasonable short term and long term weight goals.    Admit Weight  181 lb 1 oz (82.1 kg)    Goal Weight: Short Term  176 lb (79.8 kg)    Goal Weight: Long Term  170 lb (77.1 kg)    Expected Outcomes  Short Term: Continue to assess and modify interventions until short term weight is achieved;Long Term: Adherence to nutrition and physical activity/exercise program aimed toward attainment of established weight goal;Weight Loss: Understanding of general recommendations for a balanced deficit meal plan, which promotes 1-2 lb weight loss per week and includes a negative energy balance of [phone] kcal/d;Understanding recommendations for meals to include 15-35% energy as protein, 25-35% energy from fat, 35-60% energy from carbohydrates, less than 200mg  of dietary cholesterol, 20-35 gm of total fiber daily;Understanding of distribution of calorie intake throughout the day with the consumption of 4-5 meals/snacks    Diabetes  Yes    Intervention  Provide education about signs/symptoms and action to take for hypo/hyperglycemia.;Provide education about proper nutrition, including hydration, and aerobic/resistive exercise prescription along with prescribed medications to achieve blood glucose in normal ranges: Fasting glucose 65-99 mg/dL    Expected Outcomes  Short Term: Participant verbalizes understanding of the signs/symptoms and immediate care of hyper/hypoglycemia, proper foot care and  importance of medication, aerobic/resistive exercise and nutrition plan for blood glucose control.;Long Term: Attainment of HbA1C < 7%.    Hypertension  Yes    Intervention  Provide education on lifestyle modifcations including regular physical activity/exercise, weight management, moderate sodium restriction and increased consumption of fresh fruit, vegetables, and low fat dairy, alcohol moderation, and smoking cessation.;Monitor prescription use compliance.    Expected Outcomes  Short Term: Continued assessment and intervention until BP is < 140/59mm HG in hypertensive participants. < 130/58mm HG in hypertensive participants with diabetes, heart failure or chronic kidney disease.;Long Term: Maintenance of blood pressure at goal  levels.    Lipids  Yes    Intervention  Provide education and support for participant on nutrition & aerobic/resistive exercise along with prescribed medications to achieve LDL 70mg , HDL >40mg .    Expected Outcomes  Short Term: Participant states understanding of desired cholesterol values and is compliant with medications prescribed. Participant is following exercise prescription and nutrition guidelines.;Long Term: Cholesterol controlled with medications as prescribed, with individualized exercise RX and with personalized nutrition plan. Value goals: LDL < 70mg , HDL > 40 mg.       Core Components/Risk Factors/Patient Goals Review:  Goals and Risk Factor Review    Row Name 08/18/17 1543 08/20/17 1642           Core Components/Risk Factors/Patient Goals Review   Personal Goals Review  Weight Management/Obesity;Lipids;Hypertension;Diabetes  Weight Management/Obesity;Lipids;Hypertension;Diabetes      Review  Pt with multiple CAD RF willing to participate in CR exercise.   Pt with multiple CAD RF demonstrates willingness to participate in CR exercise.       Expected Outcomes  Pt with multiple CAD RF will participate in CR exercise, nutrition, lifestyle, and education  opportunities.   Pt with multiple CAD RF will participate in CR exercise, nutrition, lifestyle, and education opportunities.          Core Components/Risk Factors/Patient Goals at Discharge (Final Review):  Goals and Risk Factor Review - 08/20/17 1642      Core Components/Risk Factors/Patient Goals Review   Personal Goals Review  Weight Management/Obesity;Lipids;Hypertension;Diabetes    Review  Pt with multiple CAD RF demonstrates willingness to participate in CR exercise.     Expected Outcomes  Pt with multiple CAD RF will participate in CR exercise, nutrition, lifestyle, and education opportunities.        ITP Comments: ITP Comments    Row Name 08/14/17 0736 08/20/17 1641         ITP Comments  Dr. Fransico Him, Medical Director   30 Day ITP review. Pt off to a steady start with the CR Program.          Comments: See ITP comments.

## 2017-08-20 NOTE — Progress Notes (Signed)
Daily Session Note  Patient Details  Name: Lucas Hobbs MRN: [id number] Date of Birth: Jan 31, 1956 Referring Provider:     CARDIAC REHAB PHASE II ORIENTATION from 08/14/2017 in [place]  Referring Provider  Sueanne Margarita MD       Encounter Date: 08/20/2017  Check In: Session Check In - 08/20/17 0814      Check-In   Location  [facility]    Staff Present  Jiles Garter, RN BSN;Tyara Carol Ada, MS,ACSM CEP, Exercise Physiologist;Joann Rion, RN, BSN;Other    Supervising physician immediately available to respond to emergencies  [facility] immediately available    Physician(s)  Dr, Posey Pronto    Medication changes reported      No    Fall or balance concerns reported     No    Tobacco Cessation  No Change    Warm-up and Cool-down  Performed as group-led instruction    Resistance Training Performed  No    VAD Patient?  No      Pain Assessment   Currently in Pain?  No/denies       Capillary Blood Glucose: Results for orders placed or performed during the hospital encounter of 08/20/17 (from the past 24 hour(s))  Glucose, capillary     Status: Abnormal   Collection Time: 08/20/17  7:43 AM  Result Value Ref Range   Glucose-Capillary 132 (H) 65 - 99 mg/dL      Social History   Tobacco Use  Smoking Status Never Smoker  Smokeless Tobacco Never Used    Goals Met:  Exercise tolerated well  Goals Unmet:  Not Applicable  Comments: Pt completed his second day of cardiac rehab.  Pt tolerated light exercise without difficulty. Pt reports taking BP medications and BP maintained within parameters. Medication list reconciled. Pt denies barriers to medicaiton compliance.  PSYCHOSOCIAL ASSESSMENT:  PHQ-0. Pt exhibits positive coping skills, hopeful outlook with supportive family. No psychosocial needs identified at this time, no psychosocial interventions necessary.     Dr. Fransico Him is Medical Director for Cardiac Rehab at [facility]

## 2017-08-22 ENCOUNTER — Encounter (HOSPITAL_COMMUNITY)
Admission: RE | Admit: 2017-08-22 | Discharge: 2017-08-22 | Disposition: A | Discharge status: Home / Self Care | Payer: Managed Care, Other (non HMO) | Source: Ambulatory Visit | Attending: Cardiology | Admitting: Cardiology

## 2017-08-22 DIAGNOSIS — Z951 Presence of aortocoronary bypass graft: Secondary | ICD-10-CM | POA: Diagnosis not present

## 2017-08-22 LAB — GLUCOSE, CAPILLARY: Glucose-Capillary: 178 mg/dL — ABNORMAL HIGH (ref 65–99)

## 2017-08-25 ENCOUNTER — Encounter (HOSPITAL_COMMUNITY)
Admission: RE | Admit: 2017-08-25 | Discharge: 2017-08-25 | Disposition: A | Discharge status: Home / Self Care | Payer: Managed Care, Other (non HMO) | Source: Ambulatory Visit | Attending: Cardiology | Admitting: Cardiology

## 2017-08-25 DIAGNOSIS — Z951 Presence of aortocoronary bypass graft: Secondary | ICD-10-CM | POA: Diagnosis not present

## 2017-08-27 ENCOUNTER — Encounter (HOSPITAL_COMMUNITY)
Admission: RE | Admit: 2017-08-27 | Discharge: 2017-08-27 | Disposition: A | Discharge status: Home / Self Care | Payer: Managed Care, Other (non HMO) | Source: Ambulatory Visit | Attending: Cardiology | Admitting: Cardiology

## 2017-08-27 DIAGNOSIS — Z951 Presence of aortocoronary bypass graft: Secondary | ICD-10-CM | POA: Diagnosis not present

## 2017-08-27 LAB — GLUCOSE, CAPILLARY: GLUCOSE-CAPILLARY: 189 mg/dL — AB (ref 65–99)

## 2017-08-29 ENCOUNTER — Encounter (HOSPITAL_COMMUNITY)
Admission: RE | Admit: 2017-08-29 | Discharge: 2017-08-29 | Disposition: A | Discharge status: Home / Self Care | Payer: Managed Care, Other (non HMO) | Source: Ambulatory Visit | Attending: Cardiology | Admitting: Cardiology

## 2017-08-29 DIAGNOSIS — Z951 Presence of aortocoronary bypass graft: Secondary | ICD-10-CM

## 2017-09-01 ENCOUNTER — Encounter (HOSPITAL_COMMUNITY)
Admission: RE | Admit: 2017-09-01 | Discharge: 2017-09-01 | Disposition: A | Discharge status: Home / Self Care | Payer: Managed Care, Other (non HMO) | Source: Ambulatory Visit | Attending: Cardiology | Admitting: Cardiology

## 2017-09-01 DIAGNOSIS — Z951 Presence of aortocoronary bypass graft: Secondary | ICD-10-CM

## 2017-09-03 ENCOUNTER — Encounter (HOSPITAL_COMMUNITY)
Admission: RE | Admit: 2017-09-03 | Discharge: 2017-09-03 | Disposition: A | Discharge status: Home / Self Care | Payer: Managed Care, Other (non HMO) | Source: Ambulatory Visit | Attending: Cardiology | Admitting: Cardiology

## 2017-09-03 DIAGNOSIS — Z951 Presence of aortocoronary bypass graft: Secondary | ICD-10-CM | POA: Insufficient documentation

## 2017-09-05 ENCOUNTER — Encounter (HOSPITAL_COMMUNITY)
Admission: RE | Admit: 2017-09-05 | Discharge: 2017-09-05 | Disposition: A | Discharge status: Home / Self Care | Payer: Managed Care, Other (non HMO) | Source: Ambulatory Visit | Attending: Cardiology | Admitting: Cardiology

## 2017-09-05 DIAGNOSIS — Z951 Presence of aortocoronary bypass graft: Secondary | ICD-10-CM

## 2017-09-08 ENCOUNTER — Encounter (HOSPITAL_COMMUNITY)
Admission: RE | Admit: 2017-09-08 | Discharge: 2017-09-08 | Disposition: A | Discharge status: Home / Self Care | Payer: Managed Care, Other (non HMO) | Source: Ambulatory Visit | Attending: Cardiology | Admitting: Cardiology

## 2017-09-08 DIAGNOSIS — Z951 Presence of aortocoronary bypass graft: Secondary | ICD-10-CM | POA: Diagnosis not present

## 2017-09-10 ENCOUNTER — Encounter (HOSPITAL_COMMUNITY)
Admission: RE | Admit: 2017-09-10 | Discharge: 2017-09-10 | Disposition: A | Discharge status: Home / Self Care | Payer: Managed Care, Other (non HMO) | Source: Ambulatory Visit | Attending: Cardiology | Admitting: Cardiology

## 2017-09-10 DIAGNOSIS — Z951 Presence of aortocoronary bypass graft: Secondary | ICD-10-CM | POA: Diagnosis not present

## 2017-09-12 ENCOUNTER — Encounter (HOSPITAL_COMMUNITY)
Admission: RE | Admit: 2017-09-12 | Discharge: 2017-09-12 | Disposition: A | Discharge status: Home / Self Care | Payer: Managed Care, Other (non HMO) | Source: Ambulatory Visit | Attending: Cardiology | Admitting: Cardiology

## 2017-09-12 DIAGNOSIS — Z951 Presence of aortocoronary bypass graft: Secondary | ICD-10-CM

## 2017-09-12 LAB — GLUCOSE, CAPILLARY: GLUCOSE-CAPILLARY: 123 mg/dL — AB (ref 65–99)

## 2017-09-15 ENCOUNTER — Encounter (HOSPITAL_COMMUNITY)
Admission: RE | Admit: 2017-09-15 | Discharge: 2017-09-15 | Disposition: A | Discharge status: Home / Self Care | Payer: Managed Care, Other (non HMO) | Source: Ambulatory Visit | Attending: Cardiology | Admitting: Cardiology

## 2017-09-15 DIAGNOSIS — Z951 Presence of aortocoronary bypass graft: Secondary | ICD-10-CM

## 2017-09-15 NOTE — Progress Notes (Signed)
I have reviewed a Home Exercise Prescription with Nena Polio . Chavez is not currently exercising at home.  The patient was advised to walk 2-3 days a week for 30 minutes.  Abimael and I discussed how to progress their exercise prescription. The patient stated that they understand the exercise prescription. We reviewed exercise guidelines, target heart rate during exercise, RPE Scale, NTG use, weather, endpoints for exercise, warmup and cool down.  Patient is encouraged to come to me with any questions. I will continue to follow up with the patient to assist them with progression and safety.    Carma Lair MS, ACSM CEP 09/15/2017 3:03 PM

## 2017-09-17 ENCOUNTER — Encounter (HOSPITAL_COMMUNITY)
Admission: RE | Admit: 2017-09-17 | Discharge: 2017-09-17 | Disposition: A | Discharge status: Home / Self Care | Payer: Managed Care, Other (non HMO) | Source: Ambulatory Visit | Attending: Cardiology | Admitting: Cardiology

## 2017-09-17 ENCOUNTER — Encounter (HOSPITAL_COMMUNITY): Payer: Self-pay

## 2017-09-17 DIAGNOSIS — Z951 Presence of aortocoronary bypass graft: Secondary | ICD-10-CM

## 2017-09-18 NOTE — Progress Notes (Signed)
Cardiac Individual Treatment Plan  Patient Details  Name: Lucas Hobbs MRN: [id number] Date of Birth: [birth year] Referring Provider:     CARDIAC REHAB PHASE II ORIENTATION from 08/14/2017 in [place]  Referring Provider  Sueanne Margarita MD       Initial Encounter Date:    CARDIAC REHAB PHASE II ORIENTATION from 08/14/2017 in [facility]  Date  08/14/17  Referring Provider  Sueanne Margarita MD       Visit Diagnosis: S/P CABG x 2  Patient's Home Medications on Admission:  Current Outpatient Medications:  .  amLODipine (NORVASC) 10 MG tablet, Take 10 mg by mouth daily., Disp: , Rfl: 2 .  aspirin 81 MG chewable tablet, Chew 81 mg by mouth daily., Disp: , Rfl:  .  atorvastatin (LIPITOR) 40 MG tablet, Take 40 mg by mouth daily., Disp: , Rfl: 11 .  insulin NPH-regular Human (HUMULIN 70/30) (70-30) 100 UNIT/ML injection, Inject 36 Units into the skin daily., Disp: , Rfl:  .  metoprolol succinate (TOPROL-XL) 100 MG 24 hr tablet, Take 100 mg by mouth daily., Disp: , Rfl:   Past Medical History: Past Medical History:  Diagnosis Date  . Asperger's syndrome   . Chronic kidney disease    CKD Stage 3 05/08/2017  . Coronary artery disease   . Diabetes mellitus without complication (HCC)    IDDM  . Diabetic retinopathy ([place])   . Diabetic retinopathy ([place])   . Diabetic retinopathy ([place]) 04/17/2017  . Hyperlipidemia    mixed  . Hypertension   . NSVT (nonsustained ventricular tachycardia) ([place])   . Pulmonary nodule 03/12/2017    Tobacco Use: Social History   Tobacco Use  Smoking Status Never Smoker  Smokeless Tobacco Never Used    Labs: Recent Review Flowsheet Data    There is no flowsheet data to display.      Capillary Blood Glucose: Lab Results  Component Value Date   GLUCAP 123 (H) 09/12/2017   GLUCAP 189 (H) 08/27/2017   GLUCAP 178 (H) 08/22/2017   GLUCAP 132 (H) 08/20/2017   GLUCAP 266 (H) 08/18/2017      Exercise Target Goals:    Exercise Program Goal: Individual exercise prescription set using results from initial 6 min walk test and THRR while considering  patient's activity barriers and safety.   Exercise Prescription Goal: Initial exercise prescription builds to 30-45 minutes a day of aerobic activity, 2-3 days per week.  Home exercise guidelines will be given to patient during program as part of exercise prescription that the participant will acknowledge.  Activity Barriers & Risk Stratification: Activity Barriers & Cardiac Risk Stratification - 08/14/17 0843      Activity Barriers & Cardiac Risk Stratification   Activity Barriers  Other (comment)    Comments  Aspergers     Cardiac Risk Stratification  High       6 Minute Walk: 6 Minute Walk    Row Name 08/14/17 0842         6 Minute Walk   Phase  Initial     Distance  1800 feet     Walk Time  6 minutes     # of Rest Breaks  0     MPH  3.41     METS  4.34     RPE  11     Perceived Dyspnea   0     VO2 Peak  15.19     Symptoms  No     Resting HR  96 bpm     Resting BP  126/68     Resting Oxygen Saturation   97 %     Exercise Oxygen Saturation  during 6 min walk  97 %     Max Ex. HR  96 bpm     Max Ex. BP  150/70     2 Minute Post BP  124/80        Oxygen Initial Assessment:   Oxygen Re-Evaluation:   Oxygen Discharge (Final Oxygen Re-Evaluation):   Initial Exercise Prescription: Initial Exercise Prescription - 08/14/17 0900      Date of Initial Exercise RX and Referring Provider   Date  08/14/17    Referring Provider  Sueanne Margarita MD       Bike   Level  1    Minutes  10    METs  3.36      NuStep   Level  2    SPM  70    Minutes  10    METs  3      Track   Laps  13    Minutes  10    METs  3.3      Prescription Details   Frequency (times per week)  3x    Duration  Progress to 30 minutes of continuous aerobic without signs/symptoms of physical distress      Intensity   THRR  40-80% of Max Heartrate  64-127    Ratings of Perceived Exertion  11-13    Perceived Dyspnea  0-4      Progression   Progression  Continue progressive overload as per policy without signs/symptoms or physical distress.      Resistance Training   Training Prescription  Yes    Weight  3lbs    Reps  10-15       Perform Capillary Blood Glucose checks as needed.  Exercise Prescription Changes: Exercise Prescription Changes    Row Name 08/18/17 1000 09/08/17 1126 09/15/17 1500         Response to Exercise   Blood Pressure (Admit)  138/86  128/84  128/80     Blood Pressure (Exercise)  180/90 Recheck 140/80  168/88  178/84     Blood Pressure (Exit)  142/70  138/80  130/80     Heart Rate (Admit)  77 bpm  70 bpm  74 bpm     Heart Rate (Exercise)  123 bpm  113 bpm  115 bpm     Heart Rate (Exit)  70 bpm  70 bpm  74 bpm     Rating of Perceived Exertion (Exercise)  12  14  14      Perceived Dyspnea (Exercise)  0  0  0     Symptoms  Elevated BP  Pt did not take PM dose of Metoprolol   -  None     Comments  Pt oriented to exercise equipment  -  -     Duration  Progress to 30 minutes of  aerobic without signs/symptoms of physical distress  Continue with 30 min of aerobic exercise without signs/symptoms of physical distress.  Continue with 30 min of aerobic exercise without signs/symptoms of physical distress.     Intensity  THRR New  THRR unchanged  THRR unchanged       Progression   Progression  Continue to progress workloads to maintain intensity without signs/symptoms of physical distress.  Continue to progress workloads to  maintain intensity without signs/symptoms of physical distress.  Continue to progress workloads to maintain intensity without signs/symptoms of physical distress.     Average METs  3.15  3.78  3.89       Resistance Training   Training Prescription  Yes  Yes  Yes     Weight  1lbs Decrease in weight due to elevated BP  4lbs  5lbs     Reps  10-15  10-15  10-15     Time   10 Minutes  10 Minutes  10 Minutes       Interval Training   Interval Training  No  No  No       Bike   Level  1  1.3  1.3     Minutes  10  10  10      METs  3.29  4  3.99       NuStep   Level  -  2  2     SPM  -  85  85     Minutes  -  10  10     METs  -  3.4  -       Track   Laps  13  16  16      Minutes  10  10  10      METs  3  3.79  3.79       Home Exercise Plan   Plans to continue exercise at  -  -  Home (comment) Walking     Frequency  -  -  Add 2 additional days to program exercise sessions.     Initial Home Exercises Provided  -  -  09/15/17        Exercise Comments: Exercise Comments    Row Name 08/18/17 1046 08/18/17 1049 08/18/17 1054 09/15/17 1510     Exercise Comments  Pt's first day of rehab went fairly well. Pt did not take all prescribed meds, pt's BP was elevated. Pt's was altered to allow BP to come down. Will continue to work with pt and monitor pts progress.   Pt's first day of rehab went fairly well. Pt did not take all prescribed meds, pt's BP was elevated. Pt's exercise prescription was altered to allow BP to come down. Will continue to work with pt and monitor pts progress.   Pt's first day of rehab went fairly well. Pt oriented to exercise equipment. Pt did not take all prescribed meds, pt's BP was elevated. Pt's exercise prescription was altered to allow BP to come down. Will continue to work with pt and monitor pts progress.   Reveiwed HEP with pt. Pt is not exericising at home currently. Pt will work to implement walking on his off days from rehab. Will continue to monitor and progress pt.        Exercise Goals and Review: Exercise Goals    Row Name 08/14/17 0843             Exercise Goals   Increase Physical Activity  Yes       Intervention  Provide advice, education, support and counseling about physical activity/exercise needs.;Develop an individualized exercise prescription for aerobic and resistive training based on initial evaluation  findings, risk stratification, comorbidities and participant's personal goals.       Expected Outcomes  Short Term: Attend rehab on a regular basis to increase amount of physical activity.;Long Term: Add in home exercise to make exercise part of routine and to  increase amount of physical activity.;Long Term: Exercising regularly at least 3-5 days a week.       Increase Strength and Stamina  Yes       Intervention  Provide advice, education, support and counseling about physical activity/exercise needs.;Develop an individualized exercise prescription for aerobic and resistive training based on initial evaluation findings, risk stratification, comorbidities and participant's personal goals.       Expected Outcomes  Short Term: Increase workloads from initial exercise prescription for resistance, speed, and METs.;Short Term: Perform resistance training exercises routinely during rehab and add in resistance training at home;Long Term: Improve cardiorespiratory fitness, muscular endurance and strength as measured by increased METs and functional capacity (6MWT)       Able to understand and use rate of perceived exertion (RPE) scale  Yes       Intervention  Provide education and explanation on how to use RPE scale       Expected Outcomes  Short Term: Able to use RPE daily in rehab to express subjective intensity level;Long Term:  Able to use RPE to guide intensity level when exercising independently       Knowledge and understanding of Target Heart Rate Range (THRR)  Yes       Intervention  Provide education and explanation of THRR including how the numbers were predicted and where they are located for reference       Expected Outcomes  Short Term: Able to state/look up THRR;Short Term: Able to use daily as guideline for intensity in rehab;Long Term: Able to use THRR to govern intensity when exercising independently       Able to check pulse independently  Yes       Intervention  Provide education and  demonstration on how to check pulse in carotid and radial arteries.;Review the importance of being able to check your own pulse for safety during independent exercise       Expected Outcomes  Long Term: Able to check pulse independently and accurately;Short Term: Able to explain why pulse checking is important during independent exercise       Understanding of Exercise Prescription  Yes       Intervention  Provide education, explanation, and written materials on patient's individual exercise prescription       Expected Outcomes  Short Term: Able to explain program exercise prescription;Long Term: Able to explain home exercise prescription to exercise independently          Exercise Goals Re-Evaluation : Exercise Goals Re-Evaluation    Row Name 09/15/17 1515             Exercise Goal Re-Evaluation   Exercise Goals Review  Increase Physical Activity;Increase Strength and Stamina;Able to understand and use rate of perceived exertion (RPE) scale;Knowledge and understanding of Target Heart Rate Range (THRR);Able to check pulse independently;Understanding of Exercise Prescription       Comments  Reveiwed HEP with pt. Also reviewed RPE Scale, THRR, NTG use, weather conditions, endpoints of exercise. Pt is responding well to current exercise prescription. Will continue to increase workoads as tolerated.        Expected Outcomes  Pt will walk 2 days in addition to exercise at cardiac rehab. Pt will cotinue to increase cardiovascular strength and endurance.            Discharge Exercise Prescription (Final Exercise Prescription Changes): Exercise Prescription Changes - 09/15/17 1500      Response to Exercise   Blood Pressure (Admit)  128/80  Blood Pressure (Exercise)  178/84    Blood Pressure (Exit)  130/80    Heart Rate (Admit)  74 bpm    Heart Rate (Exercise)  115 bpm    Heart Rate (Exit)  74 bpm    Rating of Perceived Exertion (Exercise)  14    Perceived Dyspnea (Exercise)  0     Symptoms  None    Duration  Continue with 30 min of aerobic exercise without signs/symptoms of physical distress.    Intensity  THRR unchanged      Progression   Progression  Continue to progress workloads to maintain intensity without signs/symptoms of physical distress.    Average METs  3.89      Resistance Training   Training Prescription  Yes    Weight  5lbs    Reps  10-15    Time  10 Minutes      Interval Training   Interval Training  No      Bike   Level  1.3    Minutes  10    METs  3.99      NuStep   Level  2    SPM  85    Minutes  10      Track   Laps  16    Minutes  10    METs  3.79      Home Exercise Plan   Plans to continue exercise at  Home (comment) Walking    Frequency  Add 2 additional days to program exercise sessions.    Initial Home Exercises Provided  09/15/17       Nutrition:  Target Goals: Understanding of nutrition guidelines, daily intake of sodium 1500mg , cholesterol 200mg , calories 30% from fat and 7% or less from saturated fats, daily to have 5 or more servings of fruits and vegetables.  Biometrics: Pre Biometrics - 08/14/17 0749      Pre Biometrics   Height  5' 7.5" (1.715 m)    Weight  181 lb 7 oz (82.3 kg)    Waist Circumference  38.75 inches    Hip Circumference  38 inches    Waist to Hip Ratio  1.02 %    BMI (Calculated)  27.98    Triceps Skinfold  7 mm    % Body Fat  23.8 %    Grip Strength  39 kg    Flexibility  16.5 in    Single Leg Stand  6.77 seconds        Nutrition Therapy Plan and Nutrition Goals: Nutrition Therapy & Goals - 08/14/17 1042      Nutrition Therapy   Diet  Heart Healthy, Carbohydrate Controlled      Personal Nutrition Goals   Nutrition Goal  Pt to identify food quantities necessary to achieve weight loss of 6-10 lb at graduation from cardiac rehab.    Personal Goal #2  Pt to describe the potential benefits of adopting Therapeutic Lifestyle Changes    Personal Goal #3  Improved blood glucose  control as evidenced by pt's A1c trending from 8.0 toward less than 7.0.    Personal Goal #4  --      Intervention Plan   Intervention  Prescribe, educate and counsel regarding individualized specific dietary modifications aiming towards targeted core components such as weight, hypertension, lipid management, diabetes, heart failure and other comorbidities.    Expected Outcomes  Short Term Goal: Understand basic principles of dietary content, such as calories, fat, sodium, cholesterol and nutrients.;Long Term  Goal: Adherence to prescribed nutrition plan.       Nutrition Assessments: Nutrition Assessments - 08/14/17 1044      MEDFICTS Scores   Pre Score  30       Nutrition Goals Re-Evaluation:   Nutrition Goals Re-Evaluation:   Nutrition Goals Discharge (Final Nutrition Goals Re-Evaluation):   Psychosocial: Target Goals: Acknowledge presence or absence of significant depression and/or stress, maximize coping skills, provide positive support system. Participant is able to verbalize types and ability to use techniques and skills needed for reducing stress and depression.  Initial Review & Psychosocial Screening: Initial Psych Review & Screening - 08/14/17 1010      Initial Review   Current issues with  None Identified      Family Dynamics   Good Support System?  Yes Pt states that his family is his support system.  He is married and "married into a large family."      Barriers   Psychosocial barriers to participate in program  There are no identifiable barriers or psychosocial needs.      Screening Interventions   Interventions  Encouraged to exercise;Provide feedback about the scores to participant    Expected Outcomes  Short Term goal: Identification and review with participant of any Quality of Life or Depression concerns found by scoring the questionnaire.;Long Term goal: The participant improves quality of Life and PHQ9 Scores as seen by post scores and/or verbalization  of changes       Quality of Life Scores: Quality of Life - 08/14/17 0858      Quality of Life Scores   Health/Function Pre  18 %    Socioeconomic Pre  18.31 %    Psych/Spiritual Pre  15.5 %    Family Pre  20.1 %    GLOBAL Pre  17.87 %      Scores of 19 and below usually indicate a poorer quality of life in these areas.  A difference of  2-3 points is a clinically meaningful difference.  A difference of 2-3 points in the total score of the Quality of Life Index has been associated with significant improvement in overall quality of life, self-image, physical symptoms, and general health in studies assessing change in quality of life.  PHQ-9: Recent Review Flowsheet Data    Depression screen [facility] 2/9 08/20/2017   Decreased Interest 0   Down, Depressed, Hopeless 0   PHQ - 2 Score 0     Interpretation of Total Score  Total Score Depression Severity:  1-4 = Minimal depression, 5-9 = Mild depression, 10-14 = Moderate depression, 15-19 = Moderately severe depression, 20-27 = Severe depression   Psychosocial Evaluation and Intervention: Psychosocial Evaluation - 08/18/17 1545      Psychosocial Evaluation & Interventions   Interventions  Encouraged to exercise with the program and follow exercise prescription    Comments  No psychosocial needs identified. No interventions necessary.     Expected Outcomes  Pt will exhibit a positive outlook with good coping skills.     Continue Psychosocial Services   No Follow up required       Psychosocial Re-Evaluation: Psychosocial Re-Evaluation    [place] Name 08/20/17 1645 09/18/17 1059           Psychosocial Re-Evaluation   Current issues with  None Identified  None Identified      Comments  No psychosocial needs identified. No interventions necessary.   QOL Scores reviewed with patient. No psychosocial needs identified. No interventions necessary.  Expected Outcomes  Pt will continie to exhibit a positive outlook with good coping skills.    Pt will continie to exhibit a positive outlook with good coping skills.       Interventions  Encouraged to attend Cardiac Rehabilitation for the exercise  Encouraged to attend Cardiac Rehabilitation for the exercise      Continue Psychosocial Services   No Follow up required  No Follow up required         Psychosocial Discharge (Final Psychosocial Re-Evaluation): Psychosocial Re-Evaluation - 09/18/17 1059      Psychosocial Re-Evaluation   Current issues with  None Identified    Comments  QOL Scores reviewed with patient. No psychosocial needs identified. No interventions necessary.     Expected Outcomes  Pt will continie to exhibit a positive outlook with good coping skills.     Interventions  Encouraged to attend Cardiac Rehabilitation for the exercise    Continue Psychosocial Services   No Follow up required       Vocational Rehabilitation: Provide vocational rehab assistance to qualifying candidates.   Vocational Rehab Evaluation & Intervention: Vocational Rehab - 08/14/17 1003      Initial Vocational Rehab Evaluation & Intervention   Assessment shows need for Vocational Rehabilitation  No       Education: Education Goals: Education classes will be provided on a weekly basis, covering required topics. Participant will state understanding/return demonstration of topics presented.  Learning Barriers/Preferences: Learning Barriers/Preferences - 08/14/17 0747      Learning Barriers/Preferences   Learning Barriers  -- Aspergers       Education Topics: Count Your Pulse:  -Group instruction provided by verbal instruction, demonstration, patient participation and written materials to support subject.  Instructors address importance of being able to find your pulse and how to count your pulse when at home without a heart monitor.  Patients get hands on experience counting their pulse with staff help and individually.   Heart Attack, Angina, and Risk Factor Modification:   -Group instruction provided by verbal instruction, video, and written materials to support subject.  Instructors address signs and symptoms of angina and heart attacks.    Also discuss risk factors for heart disease and how to make changes to improve heart health risk factors.   Functional Fitness:  -Group instruction provided by verbal instruction, demonstration, patient participation, and written materials to support subject.  Instructors address safety measures for doing things around the house.  Discuss how to get up and down off the floor, how to pick things up properly, how to safely get out of a chair without assistance, and balance training.   Meditation and Mindfulness:  -Group instruction provided by verbal instruction, patient participation, and written materials to support subject.  Instructor addresses importance of mindfulness and meditation practice to help reduce stress and improve awareness.  Instructor also leads participants through a meditation exercise.    Stretching for Flexibility and Mobility:  -Group instruction provided by verbal instruction, patient participation, and written materials to support subject.  Instructors lead participants through series of stretches that are designed to increase flexibility thus improving mobility.  These stretches are additional exercise for major muscle groups that are typically performed during regular warm up and cool down.   Hands Only CPR:  -Group verbal, video, and participation provides a basic overview of AHA guidelines for community CPR. Role-play of emergencies allow participants the opportunity to practice calling for help and chest compression technique with discussion of AED use.  Hypertension: -Group verbal and written instruction that provides a basic overview of hypertension including the most recent diagnostic guidelines, risk factor reduction with self-care instructions and medication management.    Nutrition I  class: Heart Healthy Eating:  -Group instruction provided by PowerPoint slides, verbal discussion, and written materials to support subject matter. The instructor gives an explanation and review of the Therapeutic Lifestyle Changes diet recommendations, which includes a discussion on lipid goals, dietary fat, sodium, fiber, plant stanol/sterol esters, sugar, and the components of a well-balanced, healthy diet.   Nutrition II class: Lifestyle Skills:  -Group instruction provided by PowerPoint slides, verbal discussion, and written materials to support subject matter. The instructor gives an explanation and review of label reading, grocery shopping for heart health, heart healthy recipe modifications, and ways to make healthier choices when eating out.   Diabetes Question & Answer:  -Group instruction provided by PowerPoint slides, verbal discussion, and written materials to support subject matter. The instructor gives an explanation and review of diabetes co-morbidities, pre- and post-prandial blood glucose goals, pre-exercise blood glucose goals, signs, symptoms, and treatment of hypoglycemia and hyperglycemia, and foot care basics.   Diabetes Blitz:  -Group instruction provided by PowerPoint slides, verbal discussion, and written materials to support subject matter. The instructor gives an explanation and review of the physiology behind type 1 and type 2 diabetes, diabetes medications and rational behind using different medications, pre- and post-prandial blood glucose recommendations and Hemoglobin A1c goals, diabetes diet, and exercise including blood glucose guidelines for exercising safely.    Portion Distortion:  -Group instruction provided by PowerPoint slides, verbal discussion, written materials, and food models to support subject matter. The instructor gives an explanation of serving size versus portion size, changes in portions sizes over the last 20 years, and what consists of a serving  from each food group.   Stress Management:  -Group instruction provided by verbal instruction, video, and written materials to support subject matter.  Instructors review role of stress in heart disease and how to cope with stress positively.     Exercising on Your Own:  -Group instruction provided by verbal instruction, power point, and written materials to support subject.  Instructors discuss benefits of exercise, components of exercise, frequency and intensity of exercise, and end points for exercise.  Also discuss use of nitroglycerin and activating EMS.  Review options of places to exercise outside of rehab.  Review guidelines for sex with heart disease.   Cardiac Drugs I:  -Group instruction provided by verbal instruction and written materials to support subject.  Instructor reviews cardiac drug classes: antiplatelets, anticoagulants, beta blockers, and statins.  Instructor discusses reasons, side effects, and lifestyle considerations for each drug class.   Cardiac Drugs II:  -Group instruction provided by verbal instruction and written materials to support subject.  Instructor reviews cardiac drug classes: angiotensin converting enzyme inhibitors (ACE-I), angiotensin II receptor blockers (ARBs), nitrates, and calcium channel blockers.  Instructor discusses reasons, side effects, and lifestyle considerations for each drug class.   Anatomy and Physiology of the Circulatory System:  Group verbal and written instruction and models provide basic cardiac anatomy and physiology, with the coronary electrical and arterial systems. Review of: AMI, Angina, Valve disease, Heart Failure, Peripheral Artery Disease, Cardiac Arrhythmia, Pacemakers, and the ICD.   Other Education:  -Group or individual verbal, written, or video instructions that support the educational goals of the cardiac rehab program.   Holiday Eating Survival Tips:  -Group instruction provided by PowerPoint slides, verbal  discussion, and written materials to support subject matter. The instructor gives patients tips, tricks, and techniques to help them not only survive but enjoy the holidays despite the onslaught of food that accompanies the holidays.   Knowledge Questionnaire Score: Knowledge Questionnaire Score - 08/14/17 0858      Knowledge Questionnaire Score   Pre Score  21/24       Core Components/Risk Factors/Patient Goals at Admission: Personal Goals and Risk Factors at Admission - 08/14/17 0844      Core Components/Risk Factors/Patient Goals on Admission    Weight Management  Yes;Weight Loss    Intervention  Weight Management: Develop a combined nutrition and exercise program designed to reach desired caloric intake, while maintaining appropriate intake of nutrient and fiber, sodium and fats, and appropriate energy expenditure required for the weight goal.;Weight Management: Provide education and appropriate resources to help participant work on and attain dietary goals.;Weight Management/Obesity: Establish reasonable short term and long term weight goals.    Admit Weight  181 lb 1 oz (82.1 kg)    Goal Weight: Short Term  176 lb (79.8 kg)    Goal Weight: Long Term  170 lb (77.1 kg)    Expected Outcomes  Short Term: Continue to assess and modify interventions until short term weight is achieved;Long Term: Adherence to nutrition and physical activity/exercise program aimed toward attainment of established weight goal;Weight Loss: Understanding of general recommendations for a balanced deficit meal plan, which promotes 1-2 lb weight loss per week and includes a negative energy balance of [phone] kcal/d;Understanding recommendations for meals to include 15-35% energy as protein, 25-35% energy from fat, 35-60% energy from carbohydrates, less than 200mg  of dietary cholesterol, 20-35 gm of total fiber daily;Understanding of distribution of calorie intake throughout the day with the consumption of 4-5  meals/snacks    Diabetes  Yes    Intervention  Provide education about signs/symptoms and action to take for hypo/hyperglycemia.;Provide education about proper nutrition, including hydration, and aerobic/resistive exercise prescription along with prescribed medications to achieve blood glucose in normal ranges: Fasting glucose 65-99 mg/dL    Expected Outcomes  Short Term: Participant verbalizes understanding of the signs/symptoms and immediate care of hyper/hypoglycemia, proper foot care and importance of medication, aerobic/resistive exercise and nutrition plan for blood glucose control.;Long Term: Attainment of HbA1C < 7%.    Hypertension  Yes    Intervention  Provide education on lifestyle modifcations including regular physical activity/exercise, weight management, moderate sodium restriction and increased consumption of fresh fruit, vegetables, and low fat dairy, alcohol moderation, and smoking cessation.;Monitor prescription use compliance.    Expected Outcomes  Short Term: Continued assessment and intervention until BP is < 140/75mm HG in hypertensive participants. < 130/28mm HG in hypertensive participants with diabetes, heart failure or chronic kidney disease.;Long Term: Maintenance of blood pressure at goal levels.    Lipids  Yes    Intervention  Provide education and support for participant on nutrition & aerobic/resistive exercise along with prescribed medications to achieve LDL 70mg , HDL >40mg .    Expected Outcomes  Short Term: Participant states understanding of desired cholesterol values and is compliant with medications prescribed. Participant is following exercise prescription and nutrition guidelines.;Long Term: Cholesterol controlled with medications as prescribed, with individualized exercise RX and with personalized nutrition plan. Value goals: LDL < 70mg , HDL > 40 mg.       Core Components/Risk Factors/Patient Goals Review:  Goals and Risk Factor Review    Row Name 08/18/17 1543  08/20/17 1642 09/17/17 1714  Core Components/Risk Factors/Patient Goals Review   Personal Goals Review  Weight Management/Obesity;Lipids;Hypertension;Diabetes  Weight Management/Obesity;Lipids;Hypertension;Diabetes  Weight Management/Obesity;Lipids;Hypertension;Diabetes     Review  Pt with multiple CAD RF willing to participate in CR exercise.   Pt with multiple CAD RF demonstrates willingness to participate in CR exercise.   Pt with multiple CAD RF demonstrates willingness to participate in CR exercise.  He reports that he set a personal goal to obtain over [place] has accomplished this goal.     Expected Outcomes  Pt with multiple CAD RF will participate in CR exercise, nutrition, lifestyle, and education opportunities.   Pt with multiple CAD RF will participate in CR exercise, nutrition, lifestyle, and education opportunities.   Pt with multiple CAD RF will participate in CR exercise, nutrition, lifestyle, and education opportunities.         Core Components/Risk Factors/Patient Goals at Discharge (Final Review):  Goals and Risk Factor Review - 09/17/17 1714      Core Components/Risk Factors/Patient Goals Review   Personal Goals Review  Weight Management/Obesity;Lipids;Hypertension;Diabetes    Review  Pt with multiple CAD RF demonstrates willingness to participate in CR exercise.  He reports that he set a personal goal to obtain over [place] has accomplished this goal.    Expected Outcomes  Pt with multiple CAD RF will participate in CR exercise, nutrition, lifestyle, and education opportunities.        ITP Comments: ITP Comments    Row Name 08/14/17 0736 08/20/17 1641 09/17/17 1712       ITP Comments  Dr. Fransico Him, Medical Director   30 Day ITP review. Pt off to a steady start with the CR Program.   30 Day ITP Review.  Pt tolerating exercise well.  Down's vital signs have remained stable.         Comments: See ITP Comments.

## 2017-09-19 ENCOUNTER — Encounter (HOSPITAL_COMMUNITY)
Admission: RE | Admit: 2017-09-19 | Discharge: 2017-09-19 | Disposition: A | Discharge status: Home / Self Care | Payer: Managed Care, Other (non HMO) | Source: Ambulatory Visit | Attending: Cardiology | Admitting: Cardiology

## 2017-09-19 DIAGNOSIS — Z951 Presence of aortocoronary bypass graft: Secondary | ICD-10-CM

## 2017-09-22 ENCOUNTER — Encounter (HOSPITAL_COMMUNITY)
Admission: RE | Admit: 2017-09-22 | Discharge: 2017-09-22 | Disposition: A | Discharge status: Home / Self Care | Payer: Managed Care, Other (non HMO) | Source: Ambulatory Visit | Attending: Cardiology | Admitting: Cardiology

## 2017-09-22 DIAGNOSIS — Z951 Presence of aortocoronary bypass graft: Secondary | ICD-10-CM

## 2017-09-24 ENCOUNTER — Encounter (HOSPITAL_COMMUNITY)
Admission: RE | Admit: 2017-09-24 | Discharge: 2017-09-24 | Disposition: A | Discharge status: Home / Self Care | Payer: Managed Care, Other (non HMO) | Source: Ambulatory Visit | Attending: Cardiology | Admitting: Cardiology

## 2017-09-24 DIAGNOSIS — Z951 Presence of aortocoronary bypass graft: Secondary | ICD-10-CM

## 2017-09-26 ENCOUNTER — Encounter (HOSPITAL_COMMUNITY)
Admission: RE | Admit: 2017-09-26 | Discharge: 2017-09-26 | Disposition: A | Discharge status: Home / Self Care | Payer: Managed Care, Other (non HMO) | Source: Ambulatory Visit | Attending: Cardiology | Admitting: Cardiology

## 2017-09-26 DIAGNOSIS — Z951 Presence of aortocoronary bypass graft: Secondary | ICD-10-CM | POA: Diagnosis not present

## 2017-10-01 ENCOUNTER — Encounter (HOSPITAL_COMMUNITY)
Admission: RE | Admit: 2017-10-01 | Discharge: 2017-10-01 | Disposition: A | Discharge status: Home / Self Care | Payer: Managed Care, Other (non HMO) | Source: Ambulatory Visit | Attending: Cardiology | Admitting: Cardiology

## 2017-10-01 DIAGNOSIS — Z951 Presence of aortocoronary bypass graft: Secondary | ICD-10-CM | POA: Diagnosis not present

## 2017-10-01 NOTE — Progress Notes (Signed)
Kelcey Wickstrom 62 y.o. male DOB: 16-Jun-1955 MRN: [id number]      Nutrition Note  1. S/P CABG x 2    Past Medical History:  Diagnosis Date  . Asperger's syndrome   . Chronic kidney disease    CKD Stage 3 05/08/2017  . Coronary artery disease   . Diabetes mellitus without complication (HCC)    IDDM  . Diabetic retinopathy ([place])   . Diabetic retinopathy ([place])   . Diabetic retinopathy ([place]) 04/17/2017  . Hyperlipidemia    mixed  . Hypertension   . NSVT (nonsustained ventricular tachycardia) ([place])   . Pulmonary nodule 03/12/2017   Meds reviewed. Humulin 70/30 noted  HT: Ht Readings from Last 1 Encounters:  08/14/17 5' 7.5" (1.715 m)    WT: Wt Readings from Last 5 Encounters:  08/14/17 181 lb 7 oz (82.3 kg)     BMI 27.98   Current tobacco use? No   Labs:  Lipid Panel 09/08/2017 Total cholesterol 127 Triglycerides    76 HDL     45 LDL     67   09/08/2017  HgbA1c    7.0  CBG (last 3)  No results for input(s): GLUCAP in the last 72 hours.  Nutrition Note Spoke with pt. Nutrition plan and goals reviewed with pt. Pt is following Step 2 of the Therapeutic Lifestyle Changes diet. Pt wants to lose wt. Pt reports he has been gaining wt due to "my mom's cooking." Per pt, pt has been helping his 7 yr old mom since his dad died 04-09-17. Pt is diabetic. Last A1c indicates blood glucose well-controlled. Previous A1c was 8.0 05/05/17. This Probation officer went over Diabetes Education test results. Pt checks CBG's 2 times a day. Pt expressed understanding of the information reviewed. Pt aware of nutrition education classes offered.  Nutrition Diagnosis ? Food-and nutrition-related knowledge deficit related to lack of exposure to information as related to diagnosis of: ? CVD ? DM ? Overweight related to excessive energy intake as evidenced by a BMI of 27.98  Nutrition Intervention ? Pt's individual nutrition plan and goals reviewed with pt. ? Pt given handouts for: ? Nutrition I class ? Nutrition  II class  ? Diabetes Blitz Class  Nutrition Goal(s):  ? Pt to identify food quantities necessary to achieve weight loss of 6-10 lb at graduation from cardiac rehab.  ? Continue improved blood glucose control as evidenced by pt's A1c trending from 7.0 toward less than 7.0.  Plan:  Pt to attend nutrition classes ? Portion Distortion ? Diabetes Q & A Will provide client-centered nutrition education as part of interdisciplinary care.   Monitor and evaluate progress toward nutrition goal with team.  Derek Mound, M.Ed, RD, LDN, CDE 10/01/2017 8:09 AM

## 2017-10-03 ENCOUNTER — Encounter (HOSPITAL_COMMUNITY)
Admission: RE | Admit: 2017-10-03 | Discharge: 2017-10-03 | Disposition: A | Discharge status: Home / Self Care | Payer: Managed Care, Other (non HMO) | Source: Ambulatory Visit | Attending: Cardiology | Admitting: Cardiology

## 2017-10-03 DIAGNOSIS — Z951 Presence of aortocoronary bypass graft: Secondary | ICD-10-CM

## 2017-10-06 ENCOUNTER — Encounter (HOSPITAL_COMMUNITY)
Admission: RE | Admit: 2017-10-06 | Discharge: 2017-10-06 | Disposition: A | Discharge status: Home / Self Care | Payer: Managed Care, Other (non HMO) | Source: Ambulatory Visit | Attending: Cardiology | Admitting: Cardiology

## 2017-10-06 DIAGNOSIS — Z951 Presence of aortocoronary bypass graft: Secondary | ICD-10-CM | POA: Diagnosis not present

## 2017-10-08 ENCOUNTER — Encounter (HOSPITAL_COMMUNITY)
Admission: RE | Admit: 2017-10-08 | Discharge: 2017-10-08 | Disposition: A | Discharge status: Home / Self Care | Payer: Managed Care, Other (non HMO) | Source: Ambulatory Visit | Attending: Cardiology | Admitting: Cardiology

## 2017-10-08 DIAGNOSIS — Z951 Presence of aortocoronary bypass graft: Secondary | ICD-10-CM | POA: Diagnosis not present

## 2017-10-10 ENCOUNTER — Encounter (HOSPITAL_COMMUNITY): Payer: Self-pay

## 2017-10-10 ENCOUNTER — Encounter (HOSPITAL_COMMUNITY)
Admission: RE | Admit: 2017-10-10 | Discharge: 2017-10-10 | Disposition: A | Discharge status: Home / Self Care | Payer: Managed Care, Other (non HMO) | Source: Ambulatory Visit | Attending: Cardiology | Admitting: Cardiology

## 2017-10-10 DIAGNOSIS — Z951 Presence of aortocoronary bypass graft: Secondary | ICD-10-CM

## 2017-10-13 ENCOUNTER — Encounter (HOSPITAL_COMMUNITY)
Admission: RE | Admit: 2017-10-13 | Discharge: 2017-10-13 | Disposition: A | Discharge status: Home / Self Care | Payer: Managed Care, Other (non HMO) | Source: Ambulatory Visit | Attending: Cardiology | Admitting: Cardiology

## 2017-10-13 DIAGNOSIS — Z951 Presence of aortocoronary bypass graft: Secondary | ICD-10-CM

## 2017-10-15 ENCOUNTER — Encounter (HOSPITAL_COMMUNITY)
Admission: RE | Admit: 2017-10-15 | Discharge: 2017-10-15 | Disposition: A | Discharge status: Home / Self Care | Payer: Managed Care, Other (non HMO) | Source: Ambulatory Visit | Attending: Cardiology | Admitting: Cardiology

## 2017-10-15 DIAGNOSIS — Z951 Presence of aortocoronary bypass graft: Secondary | ICD-10-CM | POA: Diagnosis not present

## 2017-10-16 NOTE — Progress Notes (Signed)
Cardiac Individual Treatment Plan  Patient Details  Name: Lucas Hobbs MRN: [id number] Date of Birth: 1955/12/22 Referring Provider:     CARDIAC REHAB PHASE II ORIENTATION from 08/14/2017 in [place]  Referring Provider  Sueanne Margarita MD       Initial Encounter Date:    CARDIAC REHAB PHASE II ORIENTATION from 08/14/2017 in [place]  Date  08/14/17  Referring Provider  Sueanne Margarita MD       Visit Diagnosis: S/P CABG x 2  Patient's Home Medications on Admission:  Current Outpatient Medications:  .  amLODipine (NORVASC) 10 MG tablet, Take 10 mg by mouth daily., Disp: , Rfl: 2 .  aspirin 81 MG chewable tablet, Chew 81 mg by mouth daily., Disp: , Rfl:  .  atorvastatin (LIPITOR) 40 MG tablet, Take 40 mg by mouth daily., Disp: , Rfl: 11 .  insulin NPH-regular Human (HUMULIN 70/30) (70-30) 100 UNIT/ML injection, Inject 36 Units into the skin daily., Disp: , Rfl:  .  metoprolol succinate (TOPROL-XL) 100 MG 24 hr tablet, Take 100 mg by mouth daily., Disp: , Rfl:   Past Medical History: Past Medical History:  Diagnosis Date  . Asperger's syndrome   . Chronic kidney disease    CKD Stage 3 05/08/2017  . Coronary artery disease   . Diabetes mellitus without complication (HCC)    IDDM  . Diabetic retinopathy ([place])   . Diabetic retinopathy ([place])   . Diabetic retinopathy ([place]) 04/17/2017  . Hyperlipidemia    mixed  . Hypertension   . NSVT (nonsustained ventricular tachycardia) ([place])   . Pulmonary nodule 03/12/2017    Tobacco Use: Social History   Tobacco Use  Smoking Status Never Smoker  Smokeless Tobacco Never Used    Labs: Recent Review Flowsheet Data    There is no flowsheet data to display.      Capillary Blood Glucose: Lab Results  Component Value Date   GLUCAP 123 (H) 09/12/2017   GLUCAP 189 (H) 08/27/2017   GLUCAP 178 (H) 08/22/2017   GLUCAP 132 (H) 08/20/2017   GLUCAP 266 (H) 08/18/2017      Exercise Target Goals:    Exercise Program Goal: Individual exercise prescription set using results from initial 6 min walk test and THRR while considering  patient's activity barriers and safety.   Exercise Prescription Goal: Initial exercise prescription builds to 30-45 minutes a day of aerobic activity, 2-3 days per week.  Home exercise guidelines will be given to patient during program as part of exercise prescription that the participant will acknowledge.  Activity Barriers & Risk Stratification: Activity Barriers & Cardiac Risk Stratification - 08/14/17 0843      Activity Barriers & Cardiac Risk Stratification   Activity Barriers  Other (comment)    Comments  Aspergers     Cardiac Risk Stratification  High       6 Minute Walk: 6 Minute Walk    Row Name 08/14/17 0842         6 Minute Walk   Phase  Initial     Distance  1800 feet     Walk Time  6 minutes     # of Rest Breaks  0     MPH  3.41     METS  4.34     RPE  11     Perceived Dyspnea   0     VO2 Peak  15.19     Symptoms  No     Resting HR  96 bpm     Resting BP  126/68     Resting Oxygen Saturation   97 %     Exercise Oxygen Saturation  during 6 min walk  97 %     Max Ex. HR  96 bpm     Max Ex. BP  150/70     2 Minute Post BP  124/80        Oxygen Initial Assessment:   Oxygen Re-Evaluation:   Oxygen Discharge (Final Oxygen Re-Evaluation):   Initial Exercise Prescription: Initial Exercise Prescription - 08/14/17 0900      Date of Initial Exercise RX and Referring Provider   Date  08/14/17    Referring Provider  Sueanne Margarita MD       Bike   Level  1    Minutes  10    METs  3.36      NuStep   Level  2    SPM  70    Minutes  10    METs  3      Track   Laps  13    Minutes  10    METs  3.3      Prescription Details   Frequency (times per week)  3x    Duration  Progress to 30 minutes of continuous aerobic without signs/symptoms of physical distress      Intensity   THRR  40-80% of Max Heartrate  64-127    Ratings of Perceived Exertion  11-13    Perceived Dyspnea  0-4      Progression   Progression  Continue progressive overload as per policy without signs/symptoms or physical distress.      Resistance Training   Training Prescription  Yes    Weight  3lbs    Reps  10-15       Perform Capillary Blood Glucose checks as needed.  Exercise Prescription Changes: Exercise Prescription Changes    Row Name 08/18/17 1000 09/08/17 1126 09/15/17 1500 10/08/17 0727 10/15/17 1600     Response to Exercise   Blood Pressure (Admit)  138/86  128/84  128/80  122/70  112/70   Blood Pressure (Exercise)  180/90 Recheck 140/80  168/88  178/84  140/88  158/82   Blood Pressure (Exit)  142/70  138/80  130/80  130/70  118/80   Heart Rate (Admit)  77 bpm  70 bpm  74 bpm  70 bpm  67 bpm   Heart Rate (Exercise)  123 bpm  113 bpm  115 bpm  108 bpm  108 bpm   Heart Rate (Exit)  70 bpm  70 bpm  74 bpm  53 bpm  67 bpm   Rating of Perceived Exertion (Exercise)  12  14  14  14  15    Perceived Dyspnea (Exercise)  0  0  0  0  0   Symptoms  Elevated BP  Pt did not take PM dose of Metoprolol   -  None  None  None   Comments  Pt oriented to exercise equipment  -  -  -  -   Duration  Progress to 30 minutes of  aerobic without signs/symptoms of physical distress  Continue with 30 min of aerobic exercise without signs/symptoms of physical distress.  Continue with 30 min of aerobic exercise without signs/symptoms of physical distress.  Progress to 45 minutes of aerobic exercise without signs/symptoms of physical distress  Continue with 45  min of aerobic exercise without signs/symptoms of physical distress.   Intensity  THRR New  THRR unchanged  THRR unchanged  THRR unchanged  THRR unchanged     Progression   Progression  Continue to progress workloads to maintain intensity without signs/symptoms of physical distress.  Continue to progress workloads to maintain intensity without signs/symptoms  of physical distress.  Continue to progress workloads to maintain intensity without signs/symptoms of physical distress.  Continue to progress workloads to maintain intensity without signs/symptoms of physical distress.  Continue to progress workloads to maintain intensity without signs/symptoms of physical distress.   Average METs  3.15  3.78  3.89  4.06  4.02     Resistance Training   Training Prescription  Yes  Yes  Yes  No  No   Weight  1lbs Decrease in weight due to elevated BP  4lbs  5lbs  -  -   Reps  10-15  10-15  10-15  -  -   Time  10 Minutes  10 Minutes  10 Minutes  -  -     Interval Training   Interval Training  No  No  No  No  No     Bike   Level  1  1.3  1.3  1.3  1.3   Minutes  10  10  10  10  10    METs  3.29  4  3.99  3.92  3.96     NuStep   Level  -  2  2  3  3    SPM  -  85  85  85  95   Minutes  -  10  10  10  10    METs  -  3.4  -  4.3  4.3     Track   Laps  13  16  16  17  16    Minutes  10  10  10  10  10    METs  3  3.79  3.79  3.99  3.79     Home Exercise Plan   Plans to continue exercise at  -  -  Home (comment) Walking  Home (comment) Walking   Home (comment) Walking   Frequency  -  -  Add 2 additional days to program exercise sessions.  Add 2 additional days to program exercise sessions.  Add 2 additional days to program exercise sessions.   Initial Home Exercises Provided  -  -  09/15/17  09/15/17  09/15/17      Exercise Comments: Exercise Comments    Row Name 08/18/17 1046 08/18/17 1049 08/18/17 1054 09/15/17 1510 10/15/17 1641   Exercise Comments  Pt's first day of rehab went fairly well. Pt did not take all prescribed meds, pt's BP was elevated. Pt's was altered to allow BP to come down. Will continue to work with pt and monitor pts progress.   Pt's first day of rehab went fairly well. Pt did not take all prescribed meds, pt's BP was elevated. Pt's exercise prescription was altered to allow BP to come down. Will continue to work with pt and monitor pts  progress.   Pt's first day of rehab went fairly well. Pt oriented to exercise equipment. Pt did not take all prescribed meds, pt's BP was elevated. Pt's exercise prescription was altered to allow BP to come down. Will continue to work with pt and monitor pts progress.   Reveiwed HEP with pt. Pt is not exericising at home currently.  Pt will work to implement walking on his off days from rehab. Will continue to monitor and progress pt.   Reviewed METs and Goals. Pt travels far for work and is not exercising at home much. Will continue to follow up with pt regarding exercising on non cardiac rehab days.      Exercise Goals and Review: Exercise Goals    Row Name 08/14/17 0843             Exercise Goals   Increase Physical Activity  Yes       Intervention  Provide advice, education, support and counseling about physical activity/exercise needs.;Develop an individualized exercise prescription for aerobic and resistive training based on initial evaluation findings, risk stratification, comorbidities and participant's personal goals.       Expected Outcomes  Short Term: Attend rehab on a regular basis to increase amount of physical activity.;Long Term: Add in home exercise to make exercise part of routine and to increase amount of physical activity.;Long Term: Exercising regularly at least 3-5 days a week.       Increase Strength and Stamina  Yes       Intervention  Provide advice, education, support and counseling about physical activity/exercise needs.;Develop an individualized exercise prescription for aerobic and resistive training based on initial evaluation findings, risk stratification, comorbidities and participant's personal goals.       Expected Outcomes  Short Term: Increase workloads from initial exercise prescription for resistance, speed, and METs.;Short Term: Perform resistance training exercises routinely during rehab and add in resistance training at home;Long Term: Improve  cardiorespiratory fitness, muscular endurance and strength as measured by increased METs and functional capacity (6MWT)       Able to understand and use rate of perceived exertion (RPE) scale  Yes       Intervention  Provide education and explanation on how to use RPE scale       Expected Outcomes  Short Term: Able to use RPE daily in rehab to express subjective intensity level;Long Term:  Able to use RPE to guide intensity level when exercising independently       Knowledge and understanding of Target Heart Rate Range (THRR)  Yes       Intervention  Provide education and explanation of THRR including how the numbers were predicted and where they are located for reference       Expected Outcomes  Short Term: Able to state/look up THRR;Short Term: Able to use daily as guideline for intensity in rehab;Long Term: Able to use THRR to govern intensity when exercising independently       Able to check pulse independently  Yes       Intervention  Provide education and demonstration on how to check pulse in carotid and radial arteries.;Review the importance of being able to check your own pulse for safety during independent exercise       Expected Outcomes  Long Term: Able to check pulse independently and accurately;Short Term: Able to explain why pulse checking is important during independent exercise       Understanding of Exercise Prescription  Yes       Intervention  Provide education, explanation, and written materials on patient's individual exercise prescription       Expected Outcomes  Short Term: Able to explain program exercise prescription;Long Term: Able to explain home exercise prescription to exercise independently          Exercise Goals Re-Evaluation : Exercise Goals Re-Evaluation    Row Name 09/15/17  1515 10/15/17 1651           Exercise Goal Re-Evaluation   Exercise Goals Review  Increase Physical Activity;Increase Strength and Stamina;Able to understand and use rate of perceived  exertion (RPE) scale;Knowledge and understanding of Target Heart Rate Range (THRR);Able to check pulse independently;Understanding of Exercise Prescription  Increase Physical Activity;Understanding of Exercise Prescription      Comments  Reveiwed HEP with pt. Also reviewed RPE Scale, THRR, NTG use, weather conditions, endpoints of exercise. Pt is responding well to current exercise prescription. Will continue to increase workoads as tolerated.   Pt is continuing to progress and responding well to workload increases. Pt states he is feeling stronger.       Expected Outcomes  Pt will walk 2 days in addition to exercise at cardiac rehab. Pt will cotinue to increase cardiovascular strength and endurance.   Pt will continue to increase strength and stamina. Will continue to monitor and progress pt as tolerated.          Discharge Exercise Prescription (Final Exercise Prescription Changes): Exercise Prescription Changes - 10/15/17 1600      Response to Exercise   Blood Pressure (Admit)  112/70    Blood Pressure (Exercise)  158/82    Blood Pressure (Exit)  118/80    Heart Rate (Admit)  67 bpm    Heart Rate (Exercise)  108 bpm    Heart Rate (Exit)  67 bpm    Rating of Perceived Exertion (Exercise)  15    Perceived Dyspnea (Exercise)  0    Symptoms  None    Duration  Continue with 45 min of aerobic exercise without signs/symptoms of physical distress.    Intensity  THRR unchanged      Progression   Progression  Continue to progress workloads to maintain intensity without signs/symptoms of physical distress.    Average METs  4.02      Resistance Training   Training Prescription  No      Interval Training   Interval Training  No      Bike   Level  1.3    Minutes  10    METs  3.96      NuStep   Level  3    SPM  95    Minutes  10    METs  4.3      Track   Laps  16    Minutes  10    METs  3.79      Home Exercise Plan   Plans to continue exercise at  Home (comment) Walking     Frequency  Add 2 additional days to program exercise sessions.    Initial Home Exercises Provided  09/15/17       Nutrition:  Target Goals: Understanding of nutrition guidelines, daily intake of sodium 1500mg , cholesterol 200mg , calories 30% from fat and 7% or less from saturated fats, daily to have 5 or more servings of fruits and vegetables.  Biometrics: Pre Biometrics - 08/14/17 0749      Pre Biometrics   Height  5' 7.5" (1.715 m)    Weight  181 lb 7 oz (82.3 kg)    Waist Circumference  38.75 inches    Hip Circumference  38 inches    Waist to Hip Ratio  1.02 %    BMI (Calculated)  27.98    Triceps Skinfold  7 mm    % Body Fat  23.8 %    Grip Strength  39  kg    Flexibility  16.5 in    Single Leg Stand  6.77 seconds        Nutrition Therapy Plan and Nutrition Goals: Nutrition Therapy & Goals - 10/01/17 0829      Nutrition Therapy   Diet  Heart Healthy, Carbohydrate Controlled      Personal Nutrition Goals   Nutrition Goal  Pt to identify food quantities necessary to achieve weight loss of 6-10 lb at graduation from cardiac rehab.    Personal Goal #3  Improved blood glucose control as evidenced by pt's A1c trending from 8.0 toward less than 7.0.      Intervention Plan   Intervention  Prescribe, educate and counsel regarding individualized specific dietary modifications aiming towards targeted core components such as weight, hypertension, lipid management, diabetes, heart failure and other comorbidities.    Expected Outcomes  Short Term Goal: Understand basic principles of dietary content, such as calories, fat, sodium, cholesterol and nutrients.;Long Term Goal: Adherence to prescribed nutrition plan.       Nutrition Assessments: Nutrition Assessments - 08/14/17 1044      MEDFICTS Scores   Pre Score  30       Nutrition Goals Re-Evaluation:   Nutrition Goals Re-Evaluation:   Nutrition Goals Discharge (Final Nutrition Goals  Re-Evaluation):   Psychosocial: Target Goals: Acknowledge presence or absence of significant depression and/or stress, maximize coping skills, provide positive support system. Participant is able to verbalize types and ability to use techniques and skills needed for reducing stress and depression.  Initial Review & Psychosocial Screening: Initial Psych Review & Screening - 08/14/17 1010      Initial Review   Current issues with  None Identified      Family Dynamics   Good Support System?  Yes Pt states that his family is his support system.  He is married and "married into a large family."      Barriers   Psychosocial barriers to participate in program  There are no identifiable barriers or psychosocial needs.      Screening Interventions   Interventions  Encouraged to exercise;Provide feedback about the scores to participant    Expected Outcomes  Short Term goal: Identification and review with participant of any Quality of Life or Depression concerns found by scoring the questionnaire.;Long Term goal: The participant improves quality of Life and PHQ9 Scores as seen by post scores and/or verbalization of changes       Quality of Life Scores: Quality of Life - 08/14/17 0858      Quality of Life Scores   Health/Function Pre  18 %    Socioeconomic Pre  18.31 %    Psych/Spiritual Pre  15.5 %    Family Pre  20.1 %    GLOBAL Pre  17.87 %      Scores of 19 and below usually indicate a poorer quality of life in these areas.  A difference of  2-3 points is a clinically meaningful difference.  A difference of 2-3 points in the total score of the Quality of Life Index has been associated with significant improvement in overall quality of life, self-image, physical symptoms, and general health in studies assessing change in quality of life.  PHQ-9: Recent Review Flowsheet Data    Depression screen [facility] 2/9 08/20/2017   Decreased Interest 0   Down, Depressed, Hopeless 0   PHQ - 2 Score 0      Interpretation of Total Score  Total Score Depression Severity:  1-4 =  Minimal depression, 5-9 = Mild depression, 10-14 = Moderate depression, 15-19 = Moderately severe depression, 20-27 = Severe depression   Psychosocial Evaluation and Intervention: Psychosocial Evaluation - 08/18/17 1545      Psychosocial Evaluation & Interventions   Interventions  Encouraged to exercise with the program and follow exercise prescription    Comments  No psychosocial needs identified. No interventions necessary.     Expected Outcomes  Pt will exhibit a positive outlook with good coping skills.     Continue Psychosocial Services   No Follow up required       Psychosocial Re-Evaluation: Psychosocial Re-Evaluation    [place] Name 08/20/17 1645 09/18/17 1059 10/10/17 1424         Psychosocial Re-Evaluation   Current issues with  None Identified  None Identified  None Identified     Comments  No psychosocial needs identified. No interventions necessary.   QOL Scores reviewed with patient. No psychosocial needs identified. No interventions necessary.   No psychosocial needs identified. No interventions necessary.      Expected Outcomes  Pt will continie to exhibit a positive outlook with good coping skills.   Pt will continie to exhibit a positive outlook with good coping skills.   Pt will continue to exhibit a positive outlook with good coping skills.      Interventions  Encouraged to attend Cardiac Rehabilitation for the exercise  Encouraged to attend Cardiac Rehabilitation for the exercise  Encouraged to attend Cardiac Rehabilitation for the exercise     Continue Psychosocial Services   No Follow up required  No Follow up required  No Follow up required        Psychosocial Discharge (Final Psychosocial Re-Evaluation): Psychosocial Re-Evaluation - 10/10/17 1424      Psychosocial Re-Evaluation   Current issues with  None Identified    Comments  No psychosocial needs identified. No interventions  necessary.     Expected Outcomes  Pt will continue to exhibit a positive outlook with good coping skills.     Interventions  Encouraged to attend Cardiac Rehabilitation for the exercise    Continue Psychosocial Services   No Follow up required       Vocational Rehabilitation: Provide vocational rehab assistance to qualifying candidates.   Vocational Rehab Evaluation & Intervention: Vocational Rehab - 08/14/17 1003      Initial Vocational Rehab Evaluation & Intervention   Assessment shows need for Vocational Rehabilitation  No       Education: Education Goals: Education classes will be provided on a weekly basis, covering required topics. Participant will state understanding/return demonstration of topics presented.  Learning Barriers/Preferences: Learning Barriers/Preferences - 08/14/17 0747      Learning Barriers/Preferences   Learning Barriers  -- Aspergers       Education Topics: Count Your Pulse:  -Group instruction provided by verbal instruction, demonstration, patient participation and written materials to support subject.  Instructors address importance of being able to find your pulse and how to count your pulse when at home without a heart monitor.  Patients get hands on experience counting their pulse with staff help and individually.   Heart Attack, Angina, and Risk Factor Modification:  -Group instruction provided by verbal instruction, video, and written materials to support subject.  Instructors address signs and symptoms of angina and heart attacks.    Also discuss risk factors for heart disease and how to make changes to improve heart health risk factors.   Functional Fitness:  -Group instruction provided by verbal instruction,  demonstration, patient participation, and written materials to support subject.  Instructors address safety measures for doing things around the house.  Discuss how to get up and down off the floor, how to pick things up properly, how to  safely get out of a chair without assistance, and balance training.   Meditation and Mindfulness:  -Group instruction provided by verbal instruction, patient participation, and written materials to support subject.  Instructor addresses importance of mindfulness and meditation practice to help reduce stress and improve awareness.  Instructor also leads participants through a meditation exercise.    Stretching for Flexibility and Mobility:  -Group instruction provided by verbal instruction, patient participation, and written materials to support subject.  Instructors lead participants through series of stretches that are designed to increase flexibility thus improving mobility.  These stretches are additional exercise for major muscle groups that are typically performed during regular warm up and cool down.   Hands Only CPR:  -Group verbal, video, and participation provides a basic overview of AHA guidelines for community CPR. Role-play of emergencies allow participants the opportunity to practice calling for help and chest compression technique with discussion of AED use.   Hypertension: -Group verbal and written instruction that provides a basic overview of hypertension including the most recent diagnostic guidelines, risk factor reduction with self-care instructions and medication management.    Nutrition I class: Heart Healthy Eating:  -Group instruction provided by PowerPoint slides, verbal discussion, and written materials to support subject matter. The instructor gives an explanation and review of the Therapeutic Lifestyle Changes diet recommendations, which includes a discussion on lipid goals, dietary fat, sodium, fiber, plant stanol/sterol esters, sugar, and the components of a well-balanced, healthy diet.   CARDIAC REHAB PHASE II EXERCISE from 10/01/2017 in [place]  Date  10/01/17  Educator  RD      Nutrition II class: Lifestyle Skills:  -Group  instruction provided by PowerPoint slides, verbal discussion, and written materials to support subject matter. The instructor gives an explanation and review of label reading, grocery shopping for heart health, heart healthy recipe modifications, and ways to make healthier choices when eating out.   CARDIAC REHAB PHASE II EXERCISE from 10/01/2017 in [place]  Date  10/01/17  Educator  RD      Diabetes Question & Answer:  -Group instruction provided by PowerPoint slides, verbal discussion, and written materials to support subject matter. The instructor gives an explanation and review of diabetes co-morbidities, pre- and post-prandial blood glucose goals, pre-exercise blood glucose goals, signs, symptoms, and treatment of hypoglycemia and hyperglycemia, and foot care basics.   Diabetes Blitz:  -Group instruction provided by PowerPoint slides, verbal discussion, and written materials to support subject matter. The instructor gives an explanation and review of the physiology behind type 1 and type 2 diabetes, diabetes medications and rational behind using different medications, pre- and post-prandial blood glucose recommendations and Hemoglobin A1c goals, diabetes diet, and exercise including blood glucose guidelines for exercising safely.    CARDIAC REHAB PHASE II EXERCISE from 10/01/2017 in [place]  Date  10/01/17  Educator  RD      Portion Distortion:  -Group instruction provided by PowerPoint slides, verbal discussion, written materials, and food models to support subject matter. The instructor gives an explanation of serving size versus portion size, changes in portions sizes over the last 20 years, and what consists of a serving from each food group.   Stress  Management:  -Group instruction provided by verbal instruction, video, and written materials to support subject matter.  Instructors review role of stress in heart  disease and how to cope with stress positively.     Exercising on Your Own:  -Group instruction provided by verbal instruction, power point, and written materials to support subject.  Instructors discuss benefits of exercise, components of exercise, frequency and intensity of exercise, and end points for exercise.  Also discuss use of nitroglycerin and activating EMS.  Review options of places to exercise outside of rehab.  Review guidelines for sex with heart disease.   Cardiac Drugs I:  -Group instruction provided by verbal instruction and written materials to support subject.  Instructor reviews cardiac drug classes: antiplatelets, anticoagulants, beta blockers, and statins.  Instructor discusses reasons, side effects, and lifestyle considerations for each drug class.   Cardiac Drugs II:  -Group instruction provided by verbal instruction and written materials to support subject.  Instructor reviews cardiac drug classes: angiotensin converting enzyme inhibitors (ACE-I), angiotensin II receptor blockers (ARBs), nitrates, and calcium channel blockers.  Instructor discusses reasons, side effects, and lifestyle considerations for each drug class.   Anatomy and Physiology of the Circulatory System:  Group verbal and written instruction and models provide basic cardiac anatomy and physiology, with the coronary electrical and arterial systems. Review of: AMI, Angina, Valve disease, Heart Failure, Peripheral Artery Disease, Cardiac Arrhythmia, Pacemakers, and the ICD.   Other Education:  -Group or individual verbal, written, or video instructions that support the educational goals of the cardiac rehab program.   Holiday Eating Survival Tips:  -Group instruction provided by PowerPoint slides, verbal discussion, and written materials to support subject matter. The instructor gives patients tips, tricks, and techniques to help them not only survive but enjoy the holidays despite the onslaught of food  that accompanies the holidays.   Knowledge Questionnaire Score: Knowledge Questionnaire Score - 08/14/17 0858      Knowledge Questionnaire Score   Pre Score  21/24       Core Components/Risk Factors/Patient Goals at Admission: Personal Goals and Risk Factors at Admission - 08/14/17 0844      Core Components/Risk Factors/Patient Goals on Admission    Weight Management  Yes;Weight Loss    Intervention  Weight Management: Develop a combined nutrition and exercise program designed to reach desired caloric intake, while maintaining appropriate intake of nutrient and fiber, sodium and fats, and appropriate energy expenditure required for the weight goal.;Weight Management: Provide education and appropriate resources to help participant work on and attain dietary goals.;Weight Management/Obesity: Establish reasonable short term and long term weight goals.    Admit Weight  181 lb 1 oz (82.1 kg)    Goal Weight: Short Term  176 lb (79.8 kg)    Goal Weight: Long Term  170 lb (77.1 kg)    Expected Outcomes  Short Term: Continue to assess and modify interventions until short term weight is achieved;Long Term: Adherence to nutrition and physical activity/exercise program aimed toward attainment of established weight goal;Weight Loss: Understanding of general recommendations for a balanced deficit meal plan, which promotes 1-2 lb weight loss per week and includes a negative energy balance of [phone] kcal/d;Understanding recommendations for meals to include 15-35% energy as protein, 25-35% energy from fat, 35-60% energy from carbohydrates, less than 200mg  of dietary cholesterol, 20-35 gm of total fiber daily;Understanding of distribution of calorie intake throughout the day with the consumption of 4-5 meals/snacks    Diabetes  Yes    Intervention  Provide education about signs/symptoms and action to take for hypo/hyperglycemia.;Provide education about proper nutrition, including hydration, and  aerobic/resistive exercise prescription along with prescribed medications to achieve blood glucose in normal ranges: Fasting glucose 65-99 mg/dL    Expected Outcomes  Short Term: Participant verbalizes understanding of the signs/symptoms and immediate care of hyper/hypoglycemia, proper foot care and importance of medication, aerobic/resistive exercise and nutrition plan for blood glucose control.;Long Term: Attainment of HbA1C < 7%.    Hypertension  Yes    Intervention  Provide education on lifestyle modifcations including regular physical activity/exercise, weight management, moderate sodium restriction and increased consumption of fresh fruit, vegetables, and low fat dairy, alcohol moderation, and smoking cessation.;Monitor prescription use compliance.    Expected Outcomes  Short Term: Continued assessment and intervention until BP is < 140/1mm HG in hypertensive participants. < 130/28mm HG in hypertensive participants with diabetes, heart failure or chronic kidney disease.;Long Term: Maintenance of blood pressure at goal levels.    Lipids  Yes    Intervention  Provide education and support for participant on nutrition & aerobic/resistive exercise along with prescribed medications to achieve LDL 70mg , HDL >40mg .    Expected Outcomes  Short Term: Participant states understanding of desired cholesterol values and is compliant with medications prescribed. Participant is following exercise prescription and nutrition guidelines.;Long Term: Cholesterol controlled with medications as prescribed, with individualized exercise RX and with personalized nutrition plan. Value goals: LDL < 70mg , HDL > 40 mg.       Core Components/Risk Factors/Patient Goals Review:  Goals and Risk Factor Review    Row Name 08/18/17 1543 08/20/17 1642 09/17/17 1714 10/10/17 1423       Core Components/Risk Factors/Patient Goals Review   Personal Goals Review  Weight Management/Obesity;Lipids;Hypertension;Diabetes  Weight  Management/Obesity;Lipids;Hypertension;Diabetes  Weight Management/Obesity;Lipids;Hypertension;Diabetes  Weight Management/Obesity;Lipids;Hypertension;Diabetes    Review  Pt with multiple CAD RF willing to participate in CR exercise.   Pt with multiple CAD RF demonstrates willingness to participate in CR exercise.   Pt with multiple CAD RF demonstrates willingness to participate in CR exercise.  He reports that he set a personal goal to obtain over [place] has accomplished this goal.  Pt with multiple CAD RF demonstrates willingness to participate in CR exercise.  Juvencio continues to make improvements on the stepper machine.  He feels that he has increased his stamina.     Expected Outcomes  Pt with multiple CAD RF will participate in CR exercise, nutrition, lifestyle, and education opportunities.   Pt with multiple CAD RF will participate in CR exercise, nutrition, lifestyle, and education opportunities.   Pt with multiple CAD RF will participate in CR exercise, nutrition, lifestyle, and education opportunities.   Pt with multiple CAD RF will participate in CR exercise, nutrition, lifestyle, and education opportunities.        Core Components/Risk Factors/Patient Goals at Discharge (Final Review):  Goals and Risk Factor Review - 10/10/17 1423      Core Components/Risk Factors/Patient Goals Review   Personal Goals Review  Weight Management/Obesity;Lipids;Hypertension;Diabetes    Review  Pt with multiple CAD RF demonstrates willingness to participate in CR exercise.  Toshiyuki continues to make improvements on the stepper machine.  He feels that he has increased his stamina.     Expected Outcomes  Pt with multiple CAD RF will participate in CR exercise, nutrition, lifestyle, and education opportunities.        ITP Comments: ITP Comments    Row Name 08/14/17 [phone] 08/20/17 1641 09/17/17 1712 10/10/17  73     ITP Comments  Dr. Fransico Him, Medical Director   30 Day ITP review. Pt off to a steady  start with the CR Program.   30 Day ITP Review.  Pt tolerating exercise well.  Down's vital signs have remained stable.   30 Day ITP Review. [place] continues to tolerate exercise well.  He has great attendance.        Comments: See ITP Comments

## 2017-10-17 ENCOUNTER — Encounter (HOSPITAL_COMMUNITY)
Admission: RE | Admit: 2017-10-17 | Discharge: 2017-10-17 | Disposition: A | Discharge status: Home / Self Care | Payer: Managed Care, Other (non HMO) | Source: Ambulatory Visit | Attending: Cardiology | Admitting: Cardiology

## 2017-10-17 DIAGNOSIS — Z951 Presence of aortocoronary bypass graft: Secondary | ICD-10-CM

## 2017-10-20 ENCOUNTER — Encounter (HOSPITAL_COMMUNITY)
Admission: RE | Admit: 2017-10-20 | Discharge: 2017-10-20 | Disposition: A | Discharge status: Home / Self Care | Payer: Managed Care, Other (non HMO) | Source: Ambulatory Visit | Attending: Cardiology | Admitting: Cardiology

## 2017-10-20 DIAGNOSIS — Z951 Presence of aortocoronary bypass graft: Secondary | ICD-10-CM | POA: Diagnosis not present

## 2017-10-22 ENCOUNTER — Encounter (HOSPITAL_COMMUNITY)
Admission: RE | Admit: 2017-10-22 | Discharge: 2017-10-22 | Disposition: A | Discharge status: Home / Self Care | Payer: Managed Care, Other (non HMO) | Source: Ambulatory Visit | Attending: Cardiology | Admitting: Cardiology

## 2017-10-22 DIAGNOSIS — Z951 Presence of aortocoronary bypass graft: Secondary | ICD-10-CM | POA: Diagnosis not present

## 2017-10-24 ENCOUNTER — Encounter (HOSPITAL_COMMUNITY)
Admission: RE | Admit: 2017-10-24 | Discharge: 2017-10-24 | Disposition: A | Discharge status: Home / Self Care | Payer: Managed Care, Other (non HMO) | Source: Ambulatory Visit | Attending: Cardiology | Admitting: Cardiology

## 2017-10-24 VITALS — Ht 67.5 in | Wt 184.3 lb

## 2017-10-24 DIAGNOSIS — Z951 Presence of aortocoronary bypass graft: Secondary | ICD-10-CM | POA: Diagnosis not present

## 2017-10-27 ENCOUNTER — Encounter (HOSPITAL_COMMUNITY): Payer: Managed Care, Other (non HMO)

## 2017-10-29 ENCOUNTER — Encounter (HOSPITAL_COMMUNITY)
Admission: RE | Admit: 2017-10-29 | Discharge: 2017-10-29 | Disposition: A | Discharge status: Home / Self Care | Payer: Managed Care, Other (non HMO) | Source: Ambulatory Visit | Attending: Cardiology | Admitting: Cardiology

## 2017-10-29 DIAGNOSIS — Z951 Presence of aortocoronary bypass graft: Secondary | ICD-10-CM

## 2017-10-31 ENCOUNTER — Encounter (HOSPITAL_COMMUNITY)
Admission: RE | Admit: 2017-10-31 | Discharge: 2017-10-31 | Disposition: A | Discharge status: Home / Self Care | Payer: Managed Care, Other (non HMO) | Source: Ambulatory Visit | Attending: Cardiology | Admitting: Cardiology

## 2017-10-31 DIAGNOSIS — Z951 Presence of aortocoronary bypass graft: Secondary | ICD-10-CM

## 2017-11-03 ENCOUNTER — Encounter (HOSPITAL_COMMUNITY)
Admission: RE | Admit: 2017-11-03 | Discharge: 2017-11-03 | Disposition: A | Discharge status: Home / Self Care | Payer: Managed Care, Other (non HMO) | Source: Ambulatory Visit | Attending: Cardiology | Admitting: Cardiology

## 2017-11-03 DIAGNOSIS — Z951 Presence of aortocoronary bypass graft: Secondary | ICD-10-CM | POA: Diagnosis not present

## 2017-11-05 ENCOUNTER — Encounter (HOSPITAL_COMMUNITY)
Admission: RE | Admit: 2017-11-05 | Discharge: 2017-11-05 | Disposition: A | Discharge status: Home / Self Care | Payer: Managed Care, Other (non HMO) | Source: Ambulatory Visit | Attending: Cardiology | Admitting: Cardiology

## 2017-11-05 DIAGNOSIS — Z951 Presence of aortocoronary bypass graft: Secondary | ICD-10-CM | POA: Diagnosis not present

## 2017-11-07 ENCOUNTER — Encounter (HOSPITAL_COMMUNITY)
Admission: RE | Admit: 2017-11-07 | Discharge: 2017-11-07 | Disposition: A | Discharge status: Home / Self Care | Payer: Managed Care, Other (non HMO) | Source: Ambulatory Visit | Attending: Cardiology | Admitting: Cardiology

## 2017-11-07 DIAGNOSIS — Z951 Presence of aortocoronary bypass graft: Secondary | ICD-10-CM

## 2017-11-10 ENCOUNTER — Encounter (HOSPITAL_COMMUNITY)
Admission: RE | Admit: 2017-11-10 | Discharge: 2017-11-10 | Disposition: A | Discharge status: Home / Self Care | Payer: Managed Care, Other (non HMO) | Source: Ambulatory Visit | Attending: Cardiology | Admitting: Cardiology

## 2017-11-10 ENCOUNTER — Encounter (HOSPITAL_COMMUNITY): Payer: Self-pay

## 2017-11-10 DIAGNOSIS — Z951 Presence of aortocoronary bypass graft: Secondary | ICD-10-CM

## 2017-11-12 ENCOUNTER — Encounter (HOSPITAL_COMMUNITY): Admission: RE | Admit: 2017-11-12 | Payer: Managed Care, Other (non HMO) | Source: Ambulatory Visit

## 2017-11-13 NOTE — Progress Notes (Signed)
Discharge Progress Report  Patient Details  Name: Lucas Hobbs MRN: [id number] Date of Birth: 1955-06-14 Referring Provider:     [place] from 08/14/2017 in [place]  Referring Provider  Fransico Him R MD        Number of Visits: 36  Reason for Discharge:  Patient reached a stable level of exercise. Patient independent in their exercise. Patient has met program and personal goals.  Smoking History:  Social History   Tobacco Use  Smoking Status Never Smoker  Smokeless Tobacco Never Used    Diagnosis:  S/P CABG x 2  ADL UCSD:   Initial Exercise Prescription: Initial Exercise Prescription - 08/14/17 0900      Date of Initial Exercise RX and Referring Provider   Date  08/14/17    Referring Provider  Sueanne Margarita MD       Bike   Level  1    Minutes  10    METs  3.36      NuStep   Level  2    SPM  70    Minutes  10    METs  3      Track   Laps  13    Minutes  10    METs  3.3      Prescription Details   Frequency (times per week)  3x    Duration  Progress to 30 minutes of continuous aerobic without signs/symptoms of physical distress      Intensity   THRR 40-80% of Max Heartrate  64-127    Ratings of Perceived Exertion  11-13    Perceived Dyspnea  0-4      Progression   Progression  Continue progressive overload as per policy without signs/symptoms or physical distress.      Resistance Training   Training Prescription  Yes    Weight  3lbs    Reps  10-15       Discharge Exercise Prescription (Final Exercise Prescription Changes): Exercise Prescription Changes - 11/10/17 1700      Response to Exercise   Blood Pressure (Admit)  140/80    Blood Pressure (Exercise)  168/78    Blood Pressure (Exit)  128/70    Heart Rate (Admit)  91 bpm    Heart Rate (Exercise)  123 bpm    Heart Rate (Exit)  75 bpm    Rating of Perceived Exertion (Exercise)  14    Perceived Dyspnea (Exercise)  0     Symptoms  None    Comments  Pt completed Cardiac Rehab    Duration  Continue with 45 min of aerobic exercise without signs/symptoms of physical distress.    Intensity  THRR unchanged      Progression   Progression  Continue to progress workloads to maintain intensity without signs/symptoms of physical distress.    Average METs  4.27      Resistance Training   Training Prescription  Yes    Weight  5lbs    Reps  10-15    Time  10 Minutes      Interval Training   Interval Training  No      Bike   Level  1.3    Minutes  10    METs  3.96      NuStep   Level  3    SPM  105    Minutes  10    METs  4.9  Track   Laps  17    Minutes  10    METs  3.99      Home Exercise Plan   Plans to continue exercise at  Home (comment) Walking     Frequency  Add 3 additional days to program exercise sessions.    Initial Home Exercises Provided  09/15/17       Functional Capacity: 6 Minute Walk    Row Name 08/14/17 0842 10/24/17 1452       6 Minute Walk   Phase  Initial  Discharge    Distance  1800 feet  1892 feet    Distance % Change  -  5.11 %    Distance Feet Change  -  92 ft    Walk Time  6 minutes  6 minutes    # of Rest Breaks  0  0    MPH  3.41  3.58    METS  4.34  4.46    RPE  11  15    Perceived Dyspnea   0  0    VO2 Peak  15.19  15.6    Symptoms  No  No    Resting HR  96 bpm  69 bpm    Resting BP  126/68  136/70    Resting Oxygen Saturation   97 %  -    Exercise Oxygen Saturation  during 6 min walk  97 %  -    Max Ex. HR  96 bpm  100 bpm    Max Ex. BP  150/70  142/86    2 Minute Post BP  124/80  124/80       Psychological, QOL, Others - Outcomes: PHQ 2/9: Depression screen [facility] 2/9 11/10/2017 08/20/2017  Decreased Interest 0 0  Down, Depressed, Hopeless 0 0  PHQ - 2 Score 0 0    Quality of Life: Quality of Life - 11/10/17 1651      Quality of Life   Select  Quality of Life      Quality of Life Scores   Health/Function Pre  18 %    Health/Function  Post  20.8 %    Health/Function % Change  15.56 %    Socioeconomic Pre  18.31 %    Socioeconomic Post  18.94 %    Socioeconomic % Change   3.44 %    Psych/Spiritual Pre  15.5 %    Psych/Spiritual Post  20.64 %    Psych/Spiritual % Change  33.16 %    Family Pre  20.1 %    Family Post  18.8 %    Family % Change  -6.47 %    GLOBAL Pre  17.87 %    GLOBAL Post  20.06 %    GLOBAL % Change  12.26 %       Personal Goals: Goals established at orientation with interventions provided to work toward goal. Personal Goals and Risk Factors at Admission - 08/14/17 0844      Core Components/Risk Factors/Patient Goals on Admission    Weight Management  Yes;Weight Loss    Intervention  Weight Management: Develop a combined nutrition and exercise program designed to reach desired caloric intake, while maintaining appropriate intake of nutrient and fiber, sodium and fats, and appropriate energy expenditure required for the weight goal.;Weight Management: Provide education and appropriate resources to help participant work on and attain dietary goals.;Weight Management/Obesity: Establish reasonable short term and long term weight goals.    Admit Weight  181 lb 1 oz (82.1 kg)    Goal Weight: Short Term  176 lb (79.8 kg)    Goal Weight: Long Term  170 lb (77.1 kg)    Expected Outcomes  Short Term: Continue to assess and modify interventions until short term weight is achieved;Long Term: Adherence to nutrition and physical activity/exercise program aimed toward attainment of established weight goal;Weight Loss: Understanding of general recommendations for a balanced deficit meal plan, which promotes 1-2 lb weight loss per week and includes a negative energy balance of [phone] kcal/d;Understanding recommendations for meals to include 15-35% energy as protein, 25-35% energy from fat, 35-60% energy from carbohydrates, less than 232m of dietary cholesterol, 20-35 gm of total fiber daily;Understanding of distribution  of calorie intake throughout the day with the consumption of 4-5 meals/snacks    Diabetes  Yes    Intervention  Provide education about signs/symptoms and action to take for hypo/hyperglycemia.;Provide education about proper nutrition, including hydration, and aerobic/resistive exercise prescription along with prescribed medications to achieve blood glucose in normal ranges: Fasting glucose 65-99 mg/dL    Expected Outcomes  Short Term: Participant verbalizes understanding of the signs/symptoms and immediate care of hyper/hypoglycemia, proper foot care and importance of medication, aerobic/resistive exercise and nutrition plan for blood glucose control.;Long Term: Attainment of HbA1C < 7%.    Hypertension  Yes    Intervention  Provide education on lifestyle modifcations including regular physical activity/exercise, weight management, moderate sodium restriction and increased consumption of fresh fruit, vegetables, and low fat dairy, alcohol moderation, and smoking cessation.;Monitor prescription use compliance.    Expected Outcomes  Short Term: Continued assessment and intervention until BP is < 140/972mHG in hypertensive participants. < 130/8028mG in hypertensive participants with diabetes, heart failure or chronic kidney disease.;Long Term: Maintenance of blood pressure at goal levels.    Lipids  Yes    Intervention  Provide education and support for participant on nutrition & aerobic/resistive exercise along with prescribed medications to achieve LDL <60m61mDL >40mg28m Expected Outcomes  Short Term: Participant states understanding of desired cholesterol values and is compliant with medications prescribed. Participant is following exercise prescription and nutrition guidelines.;Long Term: Cholesterol controlled with medications as prescribed, with individualized exercise RX and with personalized nutrition plan. Value goals: LDL < 60mg,65m > 40 mg.        Personal Goals Discharge: Goals and Risk  Factor Review    Row Name 08/18/17 1543 08/20/17 1642 09/17/17 1714 10/10/17 1423 11/10/17 1706     Core Components/Risk Factors/Patient Goals Review   Personal Goals Review  Weight Management/Obesity;Lipids;Hypertension;Diabetes  Weight Management/Obesity;Lipids;Hypertension;Diabetes  Weight Management/Obesity;Lipids;Hypertension;Diabetes  Weight Management/Obesity;Lipids;Hypertension;Diabetes  Weight Management/Obesity;Lipids;Hypertension;Diabetes   Review  Pt with multiple CAD RF willing to participate in CR exercise.   Pt with multiple CAD RF demonstrates willingness to participate in CR exercise.   Pt with multiple CAD RF demonstrates willingness to participate in CR exercise.  He reports that he set a personal goal to obtain over [place].  Pt with multiple CAD RF demonstrates willingness to participate in CR exercise.  Garan Josephnues to make improvements on the stepper machine.  He feels that he has increased his stamina.   Pt with multiple CAD RF demonstrates willingness to participate in CR exercise.  [place] tolerated exercise well. He feels that he has increased his stamina.    Expected Outcomes  Pt with multiple CAD RF will participate in CR exercise, nutrition, lifestyle, and education opportunities.  Pt with multiple CAD RF will participate in CR exercise, nutrition, lifestyle, and education opportunities.   Pt with multiple CAD RF will participate in CR exercise, nutrition, lifestyle, and education opportunities.   Pt with multiple CAD RF will participate in CR exercise, nutrition, lifestyle, and education opportunities.   Pt with multiple CAD RF will participate in exercise, nutrition, lifestyle, and education opportunities. Abhimanyu plans to walk.       Exercise Goals and Review: Exercise Goals    Row Name 08/14/17 0843             Exercise Goals   Increase Physical Activity  Yes       Intervention  Provide advice, education, support and counseling  about physical activity/exercise needs.;Develop an individualized exercise prescription for aerobic and resistive training based on initial evaluation findings, risk stratification, comorbidities and participant's personal goals.       Expected Outcomes  Short Term: Attend rehab on a regular basis to increase amount of physical activity.;Long Term: Add in home exercise to make exercise part of routine and to increase amount of physical activity.;Long Term: Exercising regularly at least 3-5 days a week.       Increase Strength and Stamina  Yes       Intervention  Provide advice, education, support and counseling about physical activity/exercise needs.;Develop an individualized exercise prescription for aerobic and resistive training based on initial evaluation findings, risk stratification, comorbidities and participant's personal goals.       Expected Outcomes  Short Term: Increase workloads from initial exercise prescription for resistance, speed, and METs.;Short Term: Perform resistance training exercises routinely during rehab and add in resistance training at home;Long Term: Improve cardiorespiratory fitness, muscular endurance and strength as measured by increased METs and functional capacity (6MWT)       Able to understand and use rate of perceived exertion (RPE) scale  Yes       Intervention  Provide education and explanation on how to use RPE scale       Expected Outcomes  Short Term: Able to use RPE daily in rehab to express subjective intensity level;Long Term:  Able to use RPE to guide intensity level when exercising independently       Knowledge and understanding of Target Heart Rate Range (THRR)  Yes       Intervention  Provide education and explanation of THRR including how the numbers were predicted and where they are located for reference       Expected Outcomes  Short Term: Able to state/look up THRR;Short Term: Able to use daily as guideline for intensity in rehab;Long Term: Able to use  THRR to govern intensity when exercising independently       Able to check pulse independently  Yes       Intervention  Provide education and demonstration on how to check pulse in carotid and radial arteries.;Review the importance of being able to check your own pulse for safety during independent exercise       Expected Outcomes  Long Term: Able to check pulse independently and accurately;Short Term: Able to explain why pulse checking is important during independent exercise       Understanding of Exercise Prescription  Yes       Intervention  Provide education, explanation, and written materials on patient's individual exercise prescription       Expected Outcomes  Short Term: Able to explain program exercise prescription;Long Term: Able to explain home exercise prescription to exercise independently  Nutrition & Weight - Outcomes: Pre Biometrics - 10/24/17 1456      Pre Biometrics   Height  5' 7.5" (1.715 m)    Weight  184 lb 4.9 oz (83.6 kg)    Waist Circumference  39 inches    Hip Circumference  37.5 inches    Waist to Hip Ratio  1.04 %    BMI (Calculated)  28.42    Triceps Skinfold  8 mm    % Body Fat  24.6 %    Grip Strength  39 kg    Flexibility  16.5 in    Single Leg Stand  18.4 seconds        Nutrition: Nutrition Therapy & Goals - 10/01/17 0829      Nutrition Therapy   Diet  Heart Healthy, Carbohydrate Controlled      Personal Nutrition Goals   Nutrition Goal  Pt to identify food quantities necessary to achieve weight loss of 6-10 lb at graduation from cardiac rehab.    Personal Goal #3  Improved blood glucose control as evidenced by pt's A1c trending from 8.0 toward less than 7.0.      Intervention Plan   Intervention  Prescribe, educate and counsel regarding individualized specific dietary modifications aiming towards targeted core components such as weight, hypertension, lipid management, diabetes, heart failure and other comorbidities.    Expected  Outcomes  Short Term Goal: Understand basic principles of dietary content, such as calories, fat, sodium, cholesterol and nutrients.;Long Term Goal: Adherence to prescribed nutrition plan.       Nutrition Discharge: Nutrition Assessments - 11/11/17 1208      MEDFICTS Scores   Pre Score  30    Post Score  21    Score Difference  -9       Education Questionnaire Score: Knowledge Questionnaire Score - 11/10/17 1642      Knowledge Questionnaire Score   Pre Score  21/24    Post Score  23/24       Goals reviewed with patient; copy given to patient.

## 2017-11-14 ENCOUNTER — Encounter (HOSPITAL_COMMUNITY): Payer: Managed Care, Other (non HMO)

## 2017-11-17 ENCOUNTER — Encounter (HOSPITAL_COMMUNITY): Payer: Managed Care, Other (non HMO)

## 2017-11-19 ENCOUNTER — Encounter (HOSPITAL_COMMUNITY): Payer: Managed Care, Other (non HMO)

## 2018-12-07 ENCOUNTER — Other Ambulatory Visit: Payer: Self-pay

## 2018-12-07 ENCOUNTER — Emergency Department (HOSPITAL_COMMUNITY): Payer: 59

## 2018-12-07 ENCOUNTER — Encounter (HOSPITAL_COMMUNITY): Payer: Self-pay

## 2018-12-07 ENCOUNTER — Emergency Department (HOSPITAL_COMMUNITY)
Admission: EM | Admit: 2018-12-07 | Discharge: 2018-12-07 | Disposition: A | Discharge status: Home / Self Care | Payer: 59 | Attending: Emergency Medicine | Admitting: Emergency Medicine

## 2018-12-07 DIAGNOSIS — Z79899 Other long term (current) drug therapy: Secondary | ICD-10-CM | POA: Diagnosis not present

## 2018-12-07 DIAGNOSIS — I129 Hypertensive chronic kidney disease with stage 1 through stage 4 chronic kidney disease, or unspecified chronic kidney disease: Secondary | ICD-10-CM | POA: Insufficient documentation

## 2018-12-07 DIAGNOSIS — N201 Calculus of ureter: Secondary | ICD-10-CM

## 2018-12-07 DIAGNOSIS — Z951 Presence of aortocoronary bypass graft: Secondary | ICD-10-CM | POA: Diagnosis not present

## 2018-12-07 DIAGNOSIS — Z794 Long term (current) use of insulin: Secondary | ICD-10-CM | POA: Diagnosis not present

## 2018-12-07 DIAGNOSIS — N183 Chronic kidney disease, stage 3 (moderate): Secondary | ICD-10-CM | POA: Diagnosis not present

## 2018-12-07 DIAGNOSIS — E1122 Type 2 diabetes mellitus with diabetic chronic kidney disease: Secondary | ICD-10-CM | POA: Diagnosis not present

## 2018-12-07 DIAGNOSIS — N131 Hydronephrosis with ureteral stricture, not elsewhere classified: Secondary | ICD-10-CM | POA: Diagnosis not present

## 2018-12-07 DIAGNOSIS — Z7982 Long term (current) use of aspirin: Secondary | ICD-10-CM | POA: Diagnosis not present

## 2018-12-07 DIAGNOSIS — R1032 Left lower quadrant pain: Secondary | ICD-10-CM | POA: Diagnosis present

## 2018-12-07 LAB — URINALYSIS, ROUTINE W REFLEX MICROSCOPIC
Bacteria, UA: NONE SEEN
Bilirubin Urine: NEGATIVE
Glucose, UA: 150 mg/dL — AB
Hgb urine dipstick: NEGATIVE
Ketones, ur: NEGATIVE mg/dL
Leukocytes,Ua: NEGATIVE
Nitrite: NEGATIVE
Protein, ur: 30 mg/dL — AB
Specific Gravity, Urine: 1.021 (ref 1.005–1.030)
pH: 5 (ref 5.0–8.0)

## 2018-12-07 LAB — CBC WITH DIFFERENTIAL/PLATELET
Abs Immature Granulocytes: 0.05 10*3/uL (ref 0.00–0.07)
Basophils Absolute: 0 10*3/uL (ref 0.0–0.1)
Basophils Relative: 0 %
Eosinophils Absolute: 0 10*3/uL (ref 0.0–0.5)
Eosinophils Relative: 0 %
HCT: 44 % (ref 39.0–52.0)
Hemoglobin: 14.8 g/dL (ref 13.0–17.0)
Immature Granulocytes: 0 %
Lymphocytes Relative: 6 %
Lymphs Abs: 0.7 10*3/uL (ref 0.7–4.0)
MCH: 29.4 pg (ref 26.0–34.0)
MCHC: 33.6 g/dL (ref 30.0–36.0)
MCV: 87.3 fL (ref 80.0–100.0)
Monocytes Absolute: 0.9 10*3/uL (ref 0.1–1.0)
Monocytes Relative: 8 %
Neutro Abs: 10.3 10*3/uL — ABNORMAL HIGH (ref 1.7–7.7)
Neutrophils Relative %: 86 %
Platelets: 160 10*3/uL (ref 150–400)
RBC: 5.04 MIL/uL (ref 4.22–5.81)
RDW: 13.2 % (ref 11.5–15.5)
WBC: 12 10*3/uL — ABNORMAL HIGH (ref 4.0–10.5)
nRBC: 0 % (ref 0.0–0.2)

## 2018-12-07 LAB — COMPREHENSIVE METABOLIC PANEL
ALT: 33 U/L (ref 0–44)
AST: 35 U/L (ref 15–41)
Albumin: 4.2 g/dL (ref 3.5–5.0)
Alkaline Phosphatase: 65 U/L (ref 38–126)
Anion gap: 10 (ref 5–15)
BUN: 37 mg/dL — ABNORMAL HIGH (ref 8–23)
CO2: 27 mmol/L (ref 22–32)
Calcium: 9.2 mg/dL (ref 8.9–10.3)
Chloride: 106 mmol/L (ref 98–111)
Creatinine, Ser: 2.74 mg/dL — ABNORMAL HIGH (ref 0.61–1.24)
GFR calc Af Amer: 28 mL/min — ABNORMAL LOW (ref >60)
GFR calc non Af Amer: 24 mL/min — ABNORMAL LOW (ref >60)
Glucose, Bld: 130 mg/dL — ABNORMAL HIGH (ref 70–99)
Potassium: 3.9 mmol/L (ref 3.5–5.1)
Sodium: 143 mmol/L (ref 135–145)
Total Bilirubin: 0.9 mg/dL (ref 0.3–1.2)
Total Protein: 7.2 g/dL (ref 6.5–8.1)

## 2018-12-07 LAB — LIPASE, BLOOD: Lipase: 31 U/L (ref 11–51)

## 2018-12-07 MED ORDER — HYDROCODONE-ACETAMINOPHEN 5-325 MG PO TABS: 1.0000 | ORAL_TABLET | Freq: Four times a day (QID) | ORAL | 0 refills | Status: DC | PRN

## 2018-12-07 MED ORDER — ONDANSETRON 4 MG PO TBDP: 4.0000 mg | ORAL_TABLET | Freq: Three times a day (TID) | ORAL | 0 refills | Status: DC | PRN

## 2018-12-07 MED ORDER — ONDANSETRON HCL 4 MG/2ML IJ SOLN
4.0000 mg | Freq: Once | INTRAMUSCULAR | Status: AC
  Administered 2018-12-07: 4 mg via INTRAVENOUS
  Filled 2018-12-07: qty 2

## 2018-12-07 MED ORDER — TAMSULOSIN HCL 0.4 MG PO CAPS: 0.4000 mg | ORAL_CAPSULE | Freq: Every day | ORAL | 0 refills | Status: DC

## 2018-12-07 MED ORDER — MORPHINE SULFATE (PF) 4 MG/ML IV SOLN
4.0000 mg | Freq: Once | INTRAVENOUS | Status: AC
  Administered 2018-12-07: 4 mg via INTRAVENOUS
  Filled 2018-12-07: qty 1

## 2018-12-07 NOTE — ED Triage Notes (Signed)
Pt states that he thinks he has a left sided kidney stone. Pt states that he has had some nausea and vomiting.

## 2018-12-07 NOTE — Discharge Instructions (Addendum)
°  Kidney Stone There is evidence of a kidney stone on the left side.  It appears as though it is on its way out.  Some kidney stones can take up to 30 days to pass. Hydration: Hydration is key to helping a kidney stone pass.  Have a goal of half a liter of water every hour or two. Acetaminophen: May take acetaminophen (generic for Tylenol), as needed, for pain. Your daily total maximum amount of acetaminophen from all sources should be limited to 4000mg /day for persons without liver problems, or 2000mg /day for those with liver problems. Vicodin: May take Vicodin (hydrocodone-acetaminophen) as needed for severe pain.   Do not drive or perform other dangerous activities while taking this medication as it can cause drowsiness as well as changes in reaction time and judgement.   Please note that each pill of Vicodin contains 325 mg of acetaminophen (generic for Tylenol) and the above dosage limits apply. Tamsulosin: This medication is designed to help the stone pass.  Take this medication daily until stone passes. Nausea/vomiting: Use the ondansetron (generic for Zofran) for nausea or vomiting.  This medication may not prevent all vomiting or nausea, but can help facilitate better hydration. Things that can help with nausea/vomiting also include peppermint/menthol candies, vitamin B12, and ginger. Follow-up: Follow-up with the urologist as soon as possible on this matter. Return: Return to the ED for significantly increased pain, difficulty urinating, pain with urination, fever, uncontrolled vomiting, or any other major concerns.  For prescription assistance, may try using prescription discount sites or apps, such as goodrx.com

## 2018-12-07 NOTE — ED Provider Notes (Signed)
Lucas Hobbs is a 63 y.o. male, with a history of Asperger's syndrome, CKD stage III, CAD, DM, hyperlipidemia, HTN, presenting to the ED with left flank and abdominal pain.  HPI from [place], PA-C: "Patient presents to the emergency department with left-sided abdominal pain that radiates into the back that started yesterday morning.  The patient states he did not take any medications prior to arrival for symptoms.  Patient states he has had no other symptoms.  The patient feels that he may have a kidney stone that is causing his problem.  Patient states he has had 3 episodes of vomiting.  The patient denies chest pain, shortness of breath, headache,blurred vision, neck pain, fever, cough, weakness, numbness, dizziness, anorexia, edema,  diarrhea, rash, back pain, dysuria, hematemesis, bloody stool, near syncope, or syncope."  Past Medical History:  Diagnosis Date  . Asperger's syndrome   . Chronic kidney disease    CKD Stage 3 05/08/2017  . Coronary artery disease   . Diabetes mellitus without complication (HCC)    IDDM  . Diabetic retinopathy ([place])   . Diabetic retinopathy ([place])   . Diabetic retinopathy ([place]) 04/17/2017  . Hyperlipidemia    mixed  . Hypertension   . NSVT (nonsustained ventricular tachycardia) ([place])   . Pulmonary nodule 03/12/2017     Physical Exam  BP (!) 186/73 (BP Location: Right Arm)   Pulse 72   Temp 98.7 F (37.1 C) (Oral)   Resp 20   Wt 84 kg   SpO2 100%   BMI 28.58 kg/m   Physical Exam Vitals signs and nursing note reviewed.  Constitutional:      General: He is not in acute distress.    Appearance: He is well-developed. He is not diaphoretic.  HENT:     Head: Normocephalic and atraumatic.     Mouth/Throat:     Mouth: Mucous membranes are moist.     Pharynx: Oropharynx is clear.  Eyes:     Conjunctiva/sclera: Conjunctivae normal.  Neck:     Musculoskeletal: Neck supple.  Cardiovascular:     Rate and Rhythm: Normal rate and regular rhythm.     Pulses: Normal pulses.          Radial pulses are 2+ on the right side and 2+ on the left side.       Posterior tibial pulses are 2+ on the right side and 2+ on the left side.     Heart sounds: Normal heart sounds.     Comments: Tactile temperature in the extremities appropriate and equal bilaterally. Pulmonary:     Effort: Pulmonary effort is normal. No respiratory distress.     Breath sounds: Normal breath sounds.  Abdominal:     Palpations: Abdomen is soft.     Tenderness: There is abdominal tenderness. There is no guarding.    Musculoskeletal:     Right lower leg: No edema.     Left lower leg: No edema.  Lymphadenopathy:     Cervical: No cervical adenopathy.  Skin:    General: Skin is warm and dry.  Neurological:     Mental Status: He is alert.  Psychiatric:        Mood and Affect: Mood and affect normal.        Speech: Speech normal.        Behavior: Behavior normal.     ED Course/Procedures     Procedures  Abnormal Labs Reviewed  CBC WITH DIFFERENTIAL/PLATELET - Abnormal; Notable for the following components:  Result Value   WBC 12.0 (*)    Neutro Abs 10.3 (*)    All other components within normal limits  COMPREHENSIVE METABOLIC PANEL - Abnormal; Notable for the following components:   Glucose, Bld 130 (*)    BUN 37 (*)    Creatinine, Ser 2.74 (*)    GFR calc non Af Amer 24 (*)    GFR calc Af Amer 28 (*)    All other components within normal limits  URINALYSIS, ROUTINE W REFLEX MICROSCOPIC - Abnormal; Notable for the following components:   APPearance CLOUDY (*)    Glucose, UA 150 (*)    Protein, ur 30 (*)    All other components within normal limits    Ct Abdomen Pelvis Wo Contrast  Result Date: 12/07/2018 CLINICAL DATA:  Possible left-sided kidney stone, nausea, vomiting EXAM: CT ABDOMEN AND PELVIS WITHOUT CONTRAST TECHNIQUE: Multidetector CT imaging of the abdomen and pelvis was performed following the standard protocol without IV contrast.  COMPARISON:  None. FINDINGS: Lower chest: No acute abnormality. Coronary artery calcifications. Status post median sternotomy. Hepatobiliary: No solid liver abnormality is seen. No gallstones, gallbladder wall thickening, or biliary dilatation. Pancreas: Unremarkable. No pancreatic ductal dilatation or surrounding inflammatory changes. Spleen: Normal in size without significant abnormality. Adrenals/Urinary Tract: Adrenal glands are unremarkable. There is a 3 mm calculus of the distal left ureter near the ureterovesicular junction with mild left hydronephrosis and hydroureter. No other evidence of urinary tract calculus or hydronephrosis. There is mild bilateral perinephric stranding. There is a hyperdense hemorrhagic cyst of the inferior pole of the right kidney. Bladder is unremarkable. Stomach/Bowel: Stomach is within normal limits. Appendix appears normal. No evidence of bowel wall thickening, distention, or inflammatory changes. Vascular/Lymphatic: Severe, pipe like calcific aortic atherosclerosis. No enlarged abdominal or pelvic lymph nodes. Reproductive: No mass or other significant abnormality. Other: No abdominal wall hernia or abnormality. No abdominopelvic ascites. Musculoskeletal: No acute or significant osseous findings. IMPRESSION: 1. There is a 3 mm calculus of the distal left ureter near the ureterovesicular junction with mild left hydronephrosis and hydroureter. No other evidence of urinary tract calculus or hydronephrosis. 2.  Other chronic and incidental findings as detailed above. Electronically Signed   By: Eddie Candle M.D.   On: 12/07/2018 17:01     MDM   Clinical Course as of Dec 07 1517  Mon Dec 07, 2018  1735 Patient has no prior values in our system, however, a check of the care everywhere system shows patient has previous creatinines from [facility] in the 2.0-2.5 range.  Creatinine(!): 2.74 [SJ]  1801 Patient states he feels much better.  Pain now 3/10.   [SJ]    Clinical Course  User Index [SJ] Lorayne Bender, PA-C   Patient care handoff received from [facility], PA-C. Plan: Assess for adequate pain management.  Likely discharge.  Patient presented for left-sided abdominal and flank pain.  3 mm calculus in the left distal ureter noted on CT, which I suspect may be the cause of the patient's pain.  He is pain was adequately controlled during his ED course.  He will follow-up with urology. The patient was given instructions for home care as well as return precautions. Patient voices understanding of these instructions, accepts the plan, and is comfortable with discharge.    Vitals:   12/07/18 1415 12/07/18 1600 12/07/18 1709 12/07/18 1800  BP: (!) 186/73 (!) 186/79 (!) 197/84 (!) 161/69  Pulse: 72 84 60 79  Resp: 20  18 17  Temp: 98.7 F (37.1 C)     TempSrc: Oral     SpO2: 100% 96% 95% 97%  Weight: 84 kg         Layla Maw 12/08/18 1519    Dorie Rank, MD 12/09/18 1040

## 2018-12-07 NOTE — ED Provider Notes (Signed)
[facility] DEPT Provider Note   CSN: [id number] Arrival date & time: 12/07/18  1401     History   Chief Complaint Chief Complaint  Patient presents with  . Flank Pain    HPI Lucas Hobbs is a 63 y.o. male.     HPI Patient presents to the emergency department with left-sided abdominal pain that radiates into the back that started yesterday morning.  The patient states he did not take any medications prior to arrival for symptoms.  Patient states he has had no other symptoms.  The patient feels that he may have a kidney stone that is causing his problem.  Patient states he has had 3 episodes of vomiting.  The patient denies chest pain, shortness of breath, headache,blurred vision, neck pain, fever, cough, weakness, numbness, dizziness, anorexia, edema,  diarrhea, rash, back pain, dysuria, hematemesis, bloody stool, near syncope, or syncope. Past Medical History:  Diagnosis Date  . Asperger's syndrome   . Chronic kidney disease    CKD Stage 3 05/08/2017  . Coronary artery disease   . Diabetes mellitus without complication (HCC)    IDDM  . Diabetic retinopathy ([place])   . Diabetic retinopathy ([place])   . Diabetic retinopathy ([place]) 04/17/2017  . Hyperlipidemia    mixed  . Hypertension   . NSVT (nonsustained ventricular tachycardia) ([place])   . Pulmonary nodule 03/12/2017    Patient Active Problem List   Diagnosis Date Noted  . Diabetic retinopathy [facility]     Past Surgical History:  Procedure Laterality Date  . CARDIAC CATHETERIZATION    . COLONOSCOPY W/ BIOPSIES  08/10/2016  . CORONARY ARTERY BYPASS GRAFT     CABG x2; Performed at [facility] 05/07/17  . RADICAL ORCHIECTOMY          Home Medications    Prior to Admission medications   Medication Sig Start Date End Date Taking? Authorizing Provider  amLODipine (NORVASC) 10 MG tablet Take 10 mg by mouth daily. 08/06/17   [provider]  aspirin 81 MG chewable tablet Chew 81 mg by mouth daily.  02/15/13   [provider]  atorvastatin (LIPITOR) 40 MG tablet Take 40 mg by mouth daily. 08/06/17   [provider]  insulin NPH-regular Human (HUMULIN 70/30) (70-30) 100 UNIT/ML injection Inject 36 Units into the skin daily. 05/13/17 05/13/18  [provider]  metoprolol succinate (TOPROL-XL) 100 MG 24 hr tablet Take 100 mg by mouth daily. 07/03/17 07/03/18  [provider]    Family History Family History  Problem Relation Age of Onset  . CVA Father   . Heart attack Maternal Uncle   . Heart attack Paternal Uncle     Social History Social History   Tobacco Use  . Smoking status: Never Smoker  . Smokeless tobacco: Never Used  Substance Use Topics  . Alcohol use: Not Currently  . Drug use: Never     Allergies   Iodine   Review of Systems Review of Systems All other systems negative except as documented in the HPI. All pertinent positives and negatives as reviewed in the HPI.  Physical Exam Updated Vital Signs BP (!) 186/73 (BP Location: Right Arm)   Pulse 72   Temp 98.7 F (37.1 C) (Oral)   Resp 20   Wt 84 kg   SpO2 100%   BMI 28.58 kg/m   Physical Exam Vitals signs and nursing note reviewed.  Constitutional:      General: He is not in acute distress.  Appearance: He is well-developed.  HENT:     Head: Normocephalic and atraumatic.  Eyes:     Pupils: Pupils are equal, round, and reactive to light.  Neck:     Musculoskeletal: Normal range of motion and neck supple.  Cardiovascular:     Rate and Rhythm: Normal rate and regular rhythm.     Heart sounds: Normal heart sounds. No murmur. No friction rub. No gallop.   Pulmonary:     Effort: Pulmonary effort is normal. No respiratory distress.     Breath sounds: Normal breath sounds. No wheezing.  Abdominal:     General: Bowel sounds are normal. There is no distension.     Palpations: Abdomen is soft.     Tenderness: There is abdominal tenderness. There is guarding. There is no  rebound.    Skin:    General: Skin is warm and dry.     Capillary Refill: Capillary refill takes less than 2 seconds.     Findings: No erythema or rash.  Neurological:     Mental Status: He is alert and oriented to person, place, and time.     Motor: No abnormal muscle tone.     Coordination: Coordination normal.  Psychiatric:        Behavior: Behavior normal.      ED Treatments / Results  Labs (all labs ordered are listed, but only abnormal results are displayed) Labs Reviewed  CBC WITH DIFFERENTIAL/PLATELET - Abnormal; Notable for the following components:      Result Value   WBC 12.0 (*)    Neutro Abs 10.3 (*)    All other components within normal limits  COMPREHENSIVE METABOLIC PANEL - Abnormal; Notable for the following components:   Glucose, Bld 130 (*)    BUN 37 (*)    Creatinine, Ser 2.74 (*)    GFR calc non Af Amer 24 (*)    GFR calc Af Amer 28 (*)    All other components within normal limits  URINALYSIS, ROUTINE W REFLEX MICROSCOPIC - Abnormal; Notable for the following components:   APPearance CLOUDY (*)    Glucose, UA 150 (*)    Protein, ur 30 (*)    All other components within normal limits  LIPASE, BLOOD    EKG None  Radiology No results found.  Procedures Procedures (including critical care time)  Medications Ordered in ED Medications - No data to display   Initial Impression / Assessment and Plan / ED Course  I have reviewed the triage vital signs and the nursing notes.  Pertinent labs & imaging results that were available during my care of the patient were reviewed by me and considered in my medical decision making (see chart for details).        Patient has chronic renal disease that explains his creatinine of 2.7.  I feel that the patient otherwise is been stable here in the emergency department.  The patient will be given pain control and awaiting CT scan results.  Final Clinical Impressions(s) / ED Diagnoses   Final diagnoses:   None    ED Discharge Orders    None       Dalia Heading, PA-C 12/07/18 1657    Lucrezia Starch, MD 12/07/18 [phone]

## 2019-08-03 ENCOUNTER — Emergency Department (HOSPITAL_COMMUNITY): Payer: 59

## 2019-08-03 ENCOUNTER — Other Ambulatory Visit: Payer: Self-pay

## 2019-08-03 ENCOUNTER — Encounter (HOSPITAL_COMMUNITY): Payer: Self-pay

## 2019-08-03 ENCOUNTER — Inpatient Hospital Stay (HOSPITAL_COMMUNITY)
Admission: EM | Admit: 2019-08-03 | Discharge: 2019-08-17 | DRG: 177 | Disposition: A | Discharge status: Home Health | Payer: 59 | Attending: Internal Medicine | Admitting: Internal Medicine

## 2019-08-03 DIAGNOSIS — Z86010 Personal history of colonic polyps: Secondary | ICD-10-CM

## 2019-08-03 DIAGNOSIS — E11649 Type 2 diabetes mellitus with hypoglycemia without coma: Secondary | ICD-10-CM | POA: Diagnosis present

## 2019-08-03 DIAGNOSIS — E860 Dehydration: Secondary | ICD-10-CM | POA: Diagnosis present

## 2019-08-03 DIAGNOSIS — N401 Enlarged prostate with lower urinary tract symptoms: Secondary | ICD-10-CM | POA: Diagnosis present

## 2019-08-03 DIAGNOSIS — L8990 Pressure ulcer of unspecified site, unspecified stage: Secondary | ICD-10-CM | POA: Insufficient documentation

## 2019-08-03 DIAGNOSIS — R338 Other retention of urine: Secondary | ICD-10-CM | POA: Diagnosis not present

## 2019-08-03 DIAGNOSIS — Z8249 Family history of ischemic heart disease and other diseases of the circulatory system: Secondary | ICD-10-CM

## 2019-08-03 DIAGNOSIS — R066 Hiccough: Secondary | ICD-10-CM | POA: Diagnosis present

## 2019-08-03 DIAGNOSIS — E160 Drug-induced hypoglycemia without coma: Secondary | ICD-10-CM | POA: Diagnosis present

## 2019-08-03 DIAGNOSIS — Z8673 Personal history of transient ischemic attack (TIA), and cerebral infarction without residual deficits: Secondary | ICD-10-CM

## 2019-08-03 DIAGNOSIS — J1282 Pneumonia due to coronavirus disease 2019: Secondary | ICD-10-CM | POA: Diagnosis present

## 2019-08-03 DIAGNOSIS — E1143 Type 2 diabetes mellitus with diabetic autonomic (poly)neuropathy: Secondary | ICD-10-CM | POA: Diagnosis present

## 2019-08-03 DIAGNOSIS — D696 Thrombocytopenia, unspecified: Secondary | ICD-10-CM | POA: Diagnosis present

## 2019-08-03 DIAGNOSIS — Z781 Physical restraint status: Secondary | ICD-10-CM

## 2019-08-03 DIAGNOSIS — I951 Orthostatic hypotension: Secondary | ICD-10-CM | POA: Diagnosis not present

## 2019-08-03 DIAGNOSIS — L89321 Pressure ulcer of left buttock, stage 1: Secondary | ICD-10-CM | POA: Diagnosis present

## 2019-08-03 DIAGNOSIS — R0902 Hypoxemia: Secondary | ICD-10-CM

## 2019-08-03 DIAGNOSIS — E1129 Type 2 diabetes mellitus with other diabetic kidney complication: Secondary | ICD-10-CM | POA: Diagnosis present

## 2019-08-03 DIAGNOSIS — I10 Essential (primary) hypertension: Secondary | ICD-10-CM | POA: Diagnosis present

## 2019-08-03 DIAGNOSIS — R112 Nausea with vomiting, unspecified: Secondary | ICD-10-CM

## 2019-08-03 DIAGNOSIS — R1115 Cyclical vomiting syndrome unrelated to migraine: Secondary | ICD-10-CM | POA: Diagnosis present

## 2019-08-03 DIAGNOSIS — E1122 Type 2 diabetes mellitus with diabetic chronic kidney disease: Secondary | ICD-10-CM | POA: Diagnosis present

## 2019-08-03 DIAGNOSIS — U071 COVID-19: Principal | ICD-10-CM | POA: Diagnosis present

## 2019-08-03 DIAGNOSIS — N179 Acute kidney failure, unspecified: Secondary | ICD-10-CM | POA: Diagnosis present

## 2019-08-03 DIAGNOSIS — R001 Bradycardia, unspecified: Secondary | ICD-10-CM | POA: Diagnosis not present

## 2019-08-03 DIAGNOSIS — E11319 Type 2 diabetes mellitus with unspecified diabetic retinopathy without macular edema: Secondary | ICD-10-CM | POA: Diagnosis present

## 2019-08-03 DIAGNOSIS — I248 Other forms of acute ischemic heart disease: Secondary | ICD-10-CM | POA: Diagnosis present

## 2019-08-03 DIAGNOSIS — Z888 Allergy status to other drugs, medicaments and biological substances status: Secondary | ICD-10-CM

## 2019-08-03 DIAGNOSIS — T383X5A Adverse effect of insulin and oral hypoglycemic [antidiabetic] drugs, initial encounter: Secondary | ICD-10-CM | POA: Diagnosis present

## 2019-08-03 DIAGNOSIS — R52 Pain, unspecified: Secondary | ICD-10-CM

## 2019-08-03 DIAGNOSIS — R197 Diarrhea, unspecified: Secondary | ICD-10-CM | POA: Diagnosis present

## 2019-08-03 DIAGNOSIS — Z794 Long term (current) use of insulin: Secondary | ICD-10-CM

## 2019-08-03 DIAGNOSIS — Z79899 Other long term (current) drug therapy: Secondary | ICD-10-CM

## 2019-08-03 DIAGNOSIS — E1165 Type 2 diabetes mellitus with hyperglycemia: Secondary | ICD-10-CM | POA: Diagnosis not present

## 2019-08-03 DIAGNOSIS — Z951 Presence of aortocoronary bypass graft: Secondary | ICD-10-CM

## 2019-08-03 DIAGNOSIS — Z7982 Long term (current) use of aspirin: Secondary | ICD-10-CM

## 2019-08-03 DIAGNOSIS — I2510 Atherosclerotic heart disease of native coronary artery without angina pectoris: Secondary | ICD-10-CM | POA: Diagnosis present

## 2019-08-03 DIAGNOSIS — E785 Hyperlipidemia, unspecified: Secondary | ICD-10-CM | POA: Diagnosis present

## 2019-08-03 DIAGNOSIS — F845 Asperger's syndrome: Secondary | ICD-10-CM | POA: Diagnosis present

## 2019-08-03 DIAGNOSIS — N184 Chronic kidney disease, stage 4 (severe): Secondary | ICD-10-CM | POA: Diagnosis present

## 2019-08-03 DIAGNOSIS — G9341 Metabolic encephalopathy: Secondary | ICD-10-CM | POA: Diagnosis not present

## 2019-08-03 LAB — COMPREHENSIVE METABOLIC PANEL
ALT: 37 U/L (ref 0–44)
AST: 78 U/L — ABNORMAL HIGH (ref 15–41)
Albumin: 3.1 g/dL — ABNORMAL LOW (ref 3.5–5.0)
Alkaline Phosphatase: 46 U/L (ref 38–126)
Anion gap: 12 (ref 5–15)
BUN: 72 mg/dL — ABNORMAL HIGH (ref 8–23)
CO2: 26 mmol/L (ref 22–32)
Calcium: 8.1 mg/dL — ABNORMAL LOW (ref 8.9–10.3)
Chloride: 101 mmol/L (ref 98–111)
Creatinine, Ser: 2.99 mg/dL — ABNORMAL HIGH (ref 0.61–1.24)
GFR calc Af Amer: 25 mL/min — ABNORMAL LOW (ref >60)
GFR calc non Af Amer: 21 mL/min — ABNORMAL LOW (ref >60)
Glucose, Bld: 140 mg/dL — ABNORMAL HIGH (ref 70–99)
Potassium: 3.9 mmol/L (ref 3.5–5.1)
Sodium: 139 mmol/L (ref 135–145)
Total Bilirubin: 1.1 mg/dL (ref 0.3–1.2)
Total Protein: 6.2 g/dL — ABNORMAL LOW (ref 6.5–8.1)

## 2019-08-03 LAB — CBC
HCT: 42.2 % (ref 39.0–52.0)
Hemoglobin: 14.1 g/dL (ref 13.0–17.0)
MCH: 29.4 pg (ref 26.0–34.0)
MCHC: 33.4 g/dL (ref 30.0–36.0)
MCV: 87.9 fL (ref 80.0–100.0)
Platelets: 132 10*3/uL — ABNORMAL LOW (ref 150–400)
RBC: 4.8 MIL/uL (ref 4.22–5.81)
RDW: 13.2 % (ref 11.5–15.5)
WBC: 6.4 10*3/uL (ref 4.0–10.5)
nRBC: 0 % (ref 0.0–0.2)

## 2019-08-03 LAB — CBG MONITORING, ED: Glucose-Capillary: 137 mg/dL — ABNORMAL HIGH (ref 70–99)

## 2019-08-03 NOTE — ED Triage Notes (Addendum)
Pt arrived EMS with c/o n/v/ hiccups since Thursday. Pt has been taking insulin without checking sugar 38 units of 70/30 in am and 36 units 70/30 pm. On ems arrival cbg 56. EMS administered 12.5g of D10 and gave 4 zofran IV. Cbg repeat was 114.

## 2019-08-03 NOTE — ED Provider Notes (Addendum)
[facility] Emergency Department Provider Note MRN:  [id number]  Arrival date & time: 08/04/19     Chief Complaint   Hypoglycemia   History of Present Illness   Lucas Hobbs is a 64 y.o. year-old male with a history of diabetes presenting to the ED with chief complaint of hypoglycemia.  Patient endorsing persistent hiccups for the past 5 days.  Felt very nauseated prior to calling EMS, found to be hypoglycemic.  Denies chest pain, no abdominal pain, no fever, no cough, no dysuria.  Review of Systems  A complete 10 system review of systems was obtained and all systems are negative except as noted in the HPI and PMH.   Patient's Health History    Past Medical History:  Diagnosis Date  . Asperger's syndrome   . Chronic kidney disease    CKD Stage 3 05/08/2017  . Coronary artery disease   . Diabetes mellitus without complication (HCC)    IDDM  . Diabetic retinopathy ([place])   . Diabetic retinopathy ([place])   . Diabetic retinopathy ([place]) 04/17/2017  . Hyperlipidemia    mixed  . Hypertension   . NSVT (nonsustained ventricular tachycardia) ([place])   . Pulmonary nodule 03/12/2017    Past Surgical History:  Procedure Laterality Date  . CARDIAC CATHETERIZATION    . COLONOSCOPY W/ BIOPSIES  08/10/2016  . CORONARY ARTERY BYPASS GRAFT     CABG x2; Performed at [facility] 05/07/17  . RADICAL ORCHIECTOMY      Family History  Problem Relation Age of Onset  . CVA Father   . Heart attack Maternal Uncle   . Heart attack Paternal Uncle     Social History   Socioeconomic History  . Marital status: Married    Spouse name: Not on file  . Number of children: Not on file  . Years of education: Not on file  . Highest education level: Bachelor's degree (e.g., BA, AB, BS)  Occupational History  . Not on file  Tobacco Use  . Smoking status: Never Smoker  . Smokeless tobacco: Never Used  Substance and Sexual Activity  . Alcohol use: Not Currently  . Drug use: Never  .  Sexual activity: Not on file  Other Topics Concern  . Not on file  Social History Narrative  . Not on file   Social Determinants of Health   Financial Resource Strain:   . Difficulty of Paying Living Expenses:   Food Insecurity:   . Worried About Charity fundraiser in the Last Year:   . Arboriculturist in the Last Year:   Transportation Needs:   . Film/video editor (Medical):   Marland Kitchen Lack of Transportation (Non-Medical):   Physical Activity:   . Days of Exercise per Week:   . Minutes of Exercise per Session:   Stress:   . Feeling of Stress :   Social Connections:   . Frequency of Communication with Friends and Family:   . Frequency of Social Gatherings with Friends and Family:   . Attends Religious Services:   . Active Member of Clubs or Organizations:   . Attends Archivist Meetings:   Marland Kitchen Marital Status:   Intimate Partner Violence:   . Fear of Current or Ex-Partner:   . Emotionally Abused:   Marland Kitchen Physically Abused:   . Sexually Abused:      Physical Exam   Vitals:   08/03/19 2315 08/03/19 2316  BP: (!) 146/71 (!) 146/71  Pulse: 69 72  Resp: 14 (!) 23  Temp:    SpO2: 93% 95%    CONSTITUTIONAL: Chronically ill-appearing, NAD NEURO:  Alert and oriented x 3, no focal deficits EYES:  eyes equal and reactive ENT/NECK:  no LAD, no JVD CARDIO: Regular rate, well-perfused, normal S1 and S2 PULM:  CTAB no wheezing or rhonchi GI/GU:  normal bowel sounds, non-distended, non-tender MSK/SPINE:  No gross deformities, no edema SKIN:  no rash, atraumatic PSYCH:  Appropriate speech and behavior  *Additional and/or pertinent findings included in MDM below  Diagnostic and Interventional Summary    EKG Interpretation  Date/Time:  Tuesday August 03 2019 22:52:33 EDT Ventricular Rate:  68 PR Interval:    QRS Duration: 99 QT Interval:  413 QTC Calculation: 440 R Axis:   101 Text Interpretation: Sinus rhythm Probable inferior infarct, old Probable anterolateral  infarct, age indeterm No previous ECGs available Confirmed by Gerlene Fee [phone]) on 08/04/2019 12:00:52 AM      Labs Reviewed  CBC - Abnormal; Notable for the following components:      Result Value   Platelets 132 (*)    All other components within normal limits  COMPREHENSIVE METABOLIC PANEL - Abnormal; Notable for the following components:   Glucose, Bld 140 (*)    BUN 72 (*)    Creatinine, Ser 2.99 (*)    Calcium 8.1 (*)    Total Protein 6.2 (*)    Albumin 3.1 (*)    AST 78 (*)    GFR calc non Af Amer 21 (*)    GFR calc Af Amer 25 (*)    All other components within normal limits  CBG MONITORING, ED - Abnormal; Notable for the following components:   Glucose-Capillary 137 (*)    All other components within normal limits  SARS CORONAVIRUS 2 (TAT 6-24 HRS)    DG Chest 2 View  Final Result      Medications  sodium chloride 0.9 % bolus 1,000 mL (has no administration in time range)  haloperidol lactate (HALDOL) injection 2 mg (has no administration in time range)  dextrose 50 % solution 50 mL (has no administration in time range)     Procedures  /  Critical Care Procedures  ED Course and Medical Decision Making  I have reviewed the triage vital signs, the nursing notes, and pertinent available records from the EMR.  Pertinent labs & imaging results that were available during my care of the patient were reviewed by me and considered in my medical decision making (see below for details).     We will obtain screening labs and x-ray given the persistent hiccups.  Symptoms today could be explained by hypoglycemia.  Blood sugar normal here.  Labs reveal AKI, patient has now had a recurrence of hypoglycemia requiring amp of D50.  Continued p.o. intolerance.  Will request hospitalist admission.   Barth Kirks. [place]onia Small, [place] [email or web address]  Final Clinical Impressions(s) / ED Diagnoses     ICD-10-CM   1. AKI  (acute kidney injury) ([place])  N17.9   2. Hiccups  R06.6 DG Chest 2 View    DG Chest 2 View  3. Dehydration  E86.0     ED Discharge Orders    None       Discharge Instructions Discussed with and Provided to Patient:   Discharge Instructions   None       Maudie Flakes, MD 08/04/19 7893    Maudie Flakes, MD 08/04/19 [phone]

## 2019-08-04 ENCOUNTER — Encounter (HOSPITAL_COMMUNITY): Payer: Self-pay | Admitting: Internal Medicine

## 2019-08-04 ENCOUNTER — Observation Stay (HOSPITAL_COMMUNITY): Payer: 59

## 2019-08-04 ENCOUNTER — Inpatient Hospital Stay (HOSPITAL_COMMUNITY): Payer: 59

## 2019-08-04 DIAGNOSIS — I2510 Atherosclerotic heart disease of native coronary artery without angina pectoris: Secondary | ICD-10-CM | POA: Diagnosis present

## 2019-08-04 DIAGNOSIS — E860 Dehydration: Secondary | ICD-10-CM | POA: Diagnosis present

## 2019-08-04 DIAGNOSIS — E1129 Type 2 diabetes mellitus with other diabetic kidney complication: Secondary | ICD-10-CM | POA: Diagnosis present

## 2019-08-04 DIAGNOSIS — N184 Chronic kidney disease, stage 4 (severe): Secondary | ICD-10-CM

## 2019-08-04 DIAGNOSIS — E785 Hyperlipidemia, unspecified: Secondary | ICD-10-CM | POA: Diagnosis present

## 2019-08-04 DIAGNOSIS — I10 Essential (primary) hypertension: Secondary | ICD-10-CM

## 2019-08-04 DIAGNOSIS — J1282 Pneumonia due to coronavirus disease 2019: Secondary | ICD-10-CM | POA: Diagnosis present

## 2019-08-04 DIAGNOSIS — R197 Diarrhea, unspecified: Secondary | ICD-10-CM | POA: Diagnosis present

## 2019-08-04 DIAGNOSIS — R066 Hiccough: Secondary | ICD-10-CM

## 2019-08-04 DIAGNOSIS — E160 Drug-induced hypoglycemia without coma: Secondary | ICD-10-CM | POA: Diagnosis present

## 2019-08-04 DIAGNOSIS — N179 Acute kidney failure, unspecified: Secondary | ICD-10-CM

## 2019-08-04 DIAGNOSIS — R112 Nausea with vomiting, unspecified: Secondary | ICD-10-CM | POA: Diagnosis present

## 2019-08-04 DIAGNOSIS — R1115 Cyclical vomiting syndrome unrelated to migraine: Secondary | ICD-10-CM | POA: Diagnosis present

## 2019-08-04 DIAGNOSIS — F845 Asperger's syndrome: Secondary | ICD-10-CM | POA: Diagnosis present

## 2019-08-04 DIAGNOSIS — E1121 Type 2 diabetes mellitus with diabetic nephropathy: Secondary | ICD-10-CM | POA: Diagnosis not present

## 2019-08-04 DIAGNOSIS — T383X5A Adverse effect of insulin and oral hypoglycemic [antidiabetic] drugs, initial encounter: Secondary | ICD-10-CM | POA: Diagnosis present

## 2019-08-04 DIAGNOSIS — U071 COVID-19: Principal | ICD-10-CM

## 2019-08-04 DIAGNOSIS — E162 Hypoglycemia, unspecified: Secondary | ICD-10-CM | POA: Diagnosis not present

## 2019-08-04 DIAGNOSIS — E11319 Type 2 diabetes mellitus with unspecified diabetic retinopathy without macular edema: Secondary | ICD-10-CM | POA: Diagnosis present

## 2019-08-04 DIAGNOSIS — G9341 Metabolic encephalopathy: Secondary | ICD-10-CM | POA: Diagnosis not present

## 2019-08-04 DIAGNOSIS — N401 Enlarged prostate with lower urinary tract symptoms: Secondary | ICD-10-CM | POA: Diagnosis present

## 2019-08-04 DIAGNOSIS — R338 Other retention of urine: Secondary | ICD-10-CM | POA: Diagnosis not present

## 2019-08-04 DIAGNOSIS — E11649 Type 2 diabetes mellitus with hypoglycemia without coma: Secondary | ICD-10-CM | POA: Diagnosis present

## 2019-08-04 DIAGNOSIS — J9601 Acute respiratory failure with hypoxia: Secondary | ICD-10-CM | POA: Diagnosis not present

## 2019-08-04 DIAGNOSIS — E1165 Type 2 diabetes mellitus with hyperglycemia: Secondary | ICD-10-CM | POA: Diagnosis not present

## 2019-08-04 DIAGNOSIS — E1122 Type 2 diabetes mellitus with diabetic chronic kidney disease: Secondary | ICD-10-CM | POA: Diagnosis present

## 2019-08-04 DIAGNOSIS — L89321 Pressure ulcer of left buttock, stage 1: Secondary | ICD-10-CM | POA: Diagnosis present

## 2019-08-04 DIAGNOSIS — R001 Bradycardia, unspecified: Secondary | ICD-10-CM | POA: Diagnosis not present

## 2019-08-04 DIAGNOSIS — E1143 Type 2 diabetes mellitus with diabetic autonomic (poly)neuropathy: Secondary | ICD-10-CM | POA: Diagnosis present

## 2019-08-04 DIAGNOSIS — D696 Thrombocytopenia, unspecified: Secondary | ICD-10-CM | POA: Diagnosis present

## 2019-08-04 DIAGNOSIS — I248 Other forms of acute ischemic heart disease: Secondary | ICD-10-CM | POA: Diagnosis present

## 2019-08-04 LAB — ECHOCARDIOGRAM COMPLETE
Height: 67 in
Weight: 2998.26 oz

## 2019-08-04 LAB — GLUCOSE, CAPILLARY
Glucose-Capillary: 175 mg/dL — ABNORMAL HIGH (ref 70–99)
Glucose-Capillary: 244 mg/dL — ABNORMAL HIGH (ref 70–99)
Glucose-Capillary: 237 mg/dL — ABNORMAL HIGH (ref 70–99)
Glucose-Capillary: 316 mg/dL — ABNORMAL HIGH (ref 70–99)
Glucose-Capillary: 312 mg/dL — ABNORMAL HIGH (ref 70–99)
Glucose-Capillary: 329 mg/dL — ABNORMAL HIGH (ref 70–99)

## 2019-08-04 LAB — CREATININE, URINE, RANDOM: Creatinine, Urine: 149.47 mg/dL

## 2019-08-04 LAB — FERRITIN
Ferritin: 634 ng/mL — ABNORMAL HIGH (ref 24–336)
Ferritin: 707 ng/mL — ABNORMAL HIGH (ref 24–336)

## 2019-08-04 LAB — D-DIMER, QUANTITATIVE
D-Dimer, Quant: 1.78 ug/mL-FEU — ABNORMAL HIGH (ref 0.00–0.50)
D-Dimer, Quant: 1.75 ug/mL-FEU — ABNORMAL HIGH (ref 0.00–0.50)

## 2019-08-04 LAB — TYPE AND SCREEN
ABO/RH(D): A POS
Antibody Screen: NEGATIVE

## 2019-08-04 LAB — CBC WITH DIFFERENTIAL/PLATELET
Abs Immature Granulocytes: 0.02 10*3/uL (ref 0.00–0.07)
Basophils Absolute: 0 10*3/uL (ref 0.0–0.1)
Basophils Relative: 0 %
Eosinophils Absolute: 0 10*3/uL (ref 0.0–0.5)
Eosinophils Relative: 0 %
HCT: 40.1 % (ref 39.0–52.0)
Hemoglobin: 13.3 g/dL (ref 13.0–17.0)
Immature Granulocytes: 0 %
Lymphocytes Relative: 14 %
Lymphs Abs: 0.7 10*3/uL (ref 0.7–4.0)
MCH: 29.2 pg (ref 26.0–34.0)
MCHC: 33.2 g/dL (ref 30.0–36.0)
MCV: 87.9 fL (ref 80.0–100.0)
Monocytes Absolute: 0.6 10*3/uL (ref 0.1–1.0)
Monocytes Relative: 11 %
Neutro Abs: 4 10*3/uL (ref 1.7–7.7)
Neutrophils Relative %: 75 %
Platelets: 119 10*3/uL — ABNORMAL LOW (ref 150–400)
RBC: 4.56 MIL/uL (ref 4.22–5.81)
RDW: 13.1 % (ref 11.5–15.5)
WBC: 5.3 10*3/uL (ref 4.0–10.5)
nRBC: 0 % (ref 0.0–0.2)

## 2019-08-04 LAB — RESPIRATORY PANEL BY PCR

## 2019-08-04 LAB — TROPONIN I (HIGH SENSITIVITY)
Troponin I (High Sensitivity): 227 ng/L (ref <18)
Troponin I (High Sensitivity): 211 ng/L (ref <18)
Troponin I (High Sensitivity): 221 ng/L (ref <18)

## 2019-08-04 LAB — COMPREHENSIVE METABOLIC PANEL
ALT: 34 U/L (ref 0–44)
AST: 68 U/L — ABNORMAL HIGH (ref 15–41)
Albumin: 2.7 g/dL — ABNORMAL LOW (ref 3.5–5.0)
Alkaline Phosphatase: 43 U/L (ref 38–126)
Anion gap: 10 (ref 5–15)
BUN: 70 mg/dL — ABNORMAL HIGH (ref 8–23)
CO2: 25 mmol/L (ref 22–32)
Calcium: 7.8 mg/dL — ABNORMAL LOW (ref 8.9–10.3)
Chloride: 104 mmol/L (ref 98–111)
Creatinine, Ser: 3.01 mg/dL — ABNORMAL HIGH (ref 0.61–1.24)
GFR calc Af Amer: 24 mL/min — ABNORMAL LOW (ref >60)
GFR calc non Af Amer: 21 mL/min — ABNORMAL LOW (ref >60)
Glucose, Bld: 166 mg/dL — ABNORMAL HIGH (ref 70–99)
Potassium: 3.6 mmol/L (ref 3.5–5.1)
Sodium: 139 mmol/L (ref 135–145)
Total Bilirubin: 1.2 mg/dL (ref 0.3–1.2)
Total Protein: 5.4 g/dL — ABNORMAL LOW (ref 6.5–8.1)

## 2019-08-04 LAB — URINALYSIS, ROUTINE W REFLEX MICROSCOPIC
Bilirubin Urine: NEGATIVE
Glucose, UA: 50 mg/dL — AB
Hgb urine dipstick: NEGATIVE
Ketones, ur: NEGATIVE mg/dL
Leukocytes,Ua: NEGATIVE
Nitrite: NEGATIVE
Protein, ur: 30 mg/dL — AB
Specific Gravity, Urine: 1.019 (ref 1.005–1.030)
pH: 5 (ref 5.0–8.0)

## 2019-08-04 LAB — CBC
HCT: 43.1 % (ref 39.0–52.0)
Hemoglobin: 14 g/dL (ref 13.0–17.0)
MCH: 28.9 pg (ref 26.0–34.0)
MCHC: 32.5 g/dL (ref 30.0–36.0)
MCV: 88.9 fL (ref 80.0–100.0)
Platelets: 121 10*3/uL — ABNORMAL LOW (ref 150–400)
RBC: 4.85 MIL/uL (ref 4.22–5.81)
RDW: 13.1 % (ref 11.5–15.5)
WBC: 4.6 10*3/uL (ref 4.0–10.5)
nRBC: 0 % (ref 0.0–0.2)

## 2019-08-04 LAB — CREATININE, SERUM
Creatinine, Ser: 2.74 mg/dL — ABNORMAL HIGH (ref 0.61–1.24)
GFR calc Af Amer: 27 mL/min — ABNORMAL LOW (ref >60)
GFR calc non Af Amer: 24 mL/min — ABNORMAL LOW (ref >60)

## 2019-08-04 LAB — CBG MONITORING, ED
Glucose-Capillary: 65 mg/dL — ABNORMAL LOW (ref 70–99)
Glucose-Capillary: 127 mg/dL — ABNORMAL HIGH (ref 70–99)
Glucose-Capillary: 164 mg/dL — ABNORMAL HIGH (ref 70–99)
Glucose-Capillary: 158 mg/dL — ABNORMAL HIGH (ref 70–99)

## 2019-08-04 LAB — HIV ANTIBODY (ROUTINE TESTING W REFLEX): HIV Screen 4th Generation wRfx: NONREACTIVE

## 2019-08-04 LAB — SARS CORONAVIRUS 2 (TAT 6-24 HRS): SARS Coronavirus 2: POSITIVE — AB

## 2019-08-04 LAB — SODIUM, URINE, RANDOM: Sodium, Ur: 47 mmol/L

## 2019-08-04 LAB — ABO/RH: ABO/RH(D): A POS

## 2019-08-04 LAB — BRAIN NATRIURETIC PEPTIDE: B Natriuretic Peptide: 31.2 pg/mL (ref 0.0–100.0)

## 2019-08-04 LAB — LACTATE DEHYDROGENASE: LDH: 281 U/L — ABNORMAL HIGH (ref 98–192)

## 2019-08-04 LAB — PROCALCITONIN
Procalcitonin: 0.21 ng/mL
Procalcitonin: 0.17 ng/mL

## 2019-08-04 LAB — C-REACTIVE PROTEIN
CRP: 9.4 mg/dL — ABNORMAL HIGH (ref <1.0)
CRP: 9.4 mg/dL — ABNORMAL HIGH (ref <1.0)

## 2019-08-04 MED ORDER — FAMOTIDINE 20 MG PO TABS
20.0000 mg | ORAL_TABLET | Freq: Every day | ORAL | Status: DC
  Administered 2019-08-04 – 2019-08-08 (×5): 20 mg via ORAL
  Filled 2019-08-04 (×5): qty 1

## 2019-08-04 MED ORDER — ACETAMINOPHEN 325 MG PO TABS: 650.0000 mg | ORAL_TABLET | Freq: Four times a day (QID) | ORAL | Status: DC | PRN

## 2019-08-04 MED ORDER — IPRATROPIUM-ALBUTEROL 20-100 MCG/ACT IN AERS
1.0000 | INHALATION_SPRAY | Freq: Four times a day (QID) | RESPIRATORY_TRACT | Status: DC
  Administered 2019-08-04 – 2019-08-07 (×12): 1 via RESPIRATORY_TRACT
  Filled 2019-08-04: qty 4

## 2019-08-04 MED ORDER — ONDANSETRON HCL 4 MG PO TABS: 4.0000 mg | ORAL_TABLET | Freq: Four times a day (QID) | ORAL | Status: DC | PRN

## 2019-08-04 MED ORDER — SODIUM CHLORIDE 0.9 % IV SOLN: 100.0000 mg | Freq: Every day | INTRAVENOUS | Status: DC

## 2019-08-04 MED ORDER — FAMOTIDINE 20 MG PO TABS: 20.0000 mg | ORAL_TABLET | Freq: Two times a day (BID) | ORAL | Status: DC

## 2019-08-04 MED ORDER — ACETAMINOPHEN 650 MG RE SUPP: 650.0000 mg | Freq: Four times a day (QID) | RECTAL | Status: DC | PRN

## 2019-08-04 MED ORDER — DEXTROSE 50 % IV SOLN
1.0000 | Freq: Once | INTRAVENOUS | Status: AC
  Administered 2019-08-04: 01:00:00 50 mL via INTRAVENOUS

## 2019-08-04 MED ORDER — GUAIFENESIN-DM 100-10 MG/5ML PO SYRP
10.0000 mL | ORAL_SOLUTION | ORAL | Status: DC | PRN
  Administered 2019-08-16: 11:00:00 10 mL via ORAL
  Filled 2019-08-04: qty 10

## 2019-08-04 MED ORDER — ASCORBIC ACID 500 MG PO TABS
500.0000 mg | ORAL_TABLET | Freq: Every day | ORAL | Status: DC
  Administered 2019-08-04 – 2019-08-17 (×11): 500 mg via ORAL
  Filled 2019-08-04 (×13): qty 1

## 2019-08-04 MED ORDER — ATORVASTATIN CALCIUM 40 MG PO TABS
40.0000 mg | ORAL_TABLET | Freq: Every evening | ORAL | Status: DC
  Administered 2019-08-04 – 2019-08-16 (×10): 40 mg via ORAL
  Filled 2019-08-04 (×11): qty 1

## 2019-08-04 MED ORDER — ONDANSETRON HCL 4 MG/2ML IJ SOLN: 4.0000 mg | Freq: Four times a day (QID) | INTRAMUSCULAR | Status: DC | PRN

## 2019-08-04 MED ORDER — SODIUM CHLORIDE 0.9 % IV SOLN: INTRAVENOUS | Status: DC

## 2019-08-04 MED ORDER — METOPROLOL SUCCINATE ER 100 MG PO TB24
100.0000 mg | ORAL_TABLET | Freq: Every evening | ORAL | Status: DC
  Administered 2019-08-04 – 2019-08-08 (×5): 100 mg via ORAL
  Filled 2019-08-04 (×5): qty 1

## 2019-08-04 MED ORDER — DEXTROSE IN LACTATED RINGERS 5 % IV SOLN: INTRAVENOUS | Status: DC

## 2019-08-04 MED ORDER — ZINC SULFATE 220 (50 ZN) MG PO CAPS
220.0000 mg | ORAL_CAPSULE | Freq: Every day | ORAL | Status: DC
  Administered 2019-08-04 – 2019-08-17 (×11): 220 mg via ORAL
  Filled 2019-08-04 (×13): qty 1

## 2019-08-04 MED ORDER — SODIUM CHLORIDE 0.9 % IV BOLUS
1000.0000 mL | Freq: Once | INTRAVENOUS | Status: AC
  Administered 2019-08-04: 01:00:00 1000 mL via INTRAVENOUS

## 2019-08-04 MED ORDER — HALOPERIDOL LACTATE 5 MG/ML IJ SOLN
2.0000 mg | Freq: Once | INTRAMUSCULAR | Status: AC
  Administered 2019-08-04: 01:00:00 2 mg via INTRAVENOUS
  Filled 2019-08-04: qty 1

## 2019-08-04 MED ORDER — DEXAMETHASONE SODIUM PHOSPHATE 10 MG/ML IJ SOLN
6.0000 mg | Freq: Every day | INTRAMUSCULAR | Status: AC
  Administered 2019-08-04 – 2019-08-13 (×10): 6 mg via INTRAVENOUS
  Filled 2019-08-04 (×10): qty 1

## 2019-08-04 MED ORDER — SODIUM CHLORIDE 0.9 % IV SOLN: 200.0000 mg | Freq: Once | INTRAVENOUS | Status: DC

## 2019-08-04 MED ORDER — SODIUM CHLORIDE 0.9 % IV SOLN
3.0000 g | Freq: Two times a day (BID) | INTRAVENOUS | Status: DC
  Administered 2019-08-04 – 2019-08-06 (×5): 3 g via INTRAVENOUS
  Filled 2019-08-04 (×2): qty 3
  Filled 2019-08-04: qty 8
  Filled 2019-08-04 (×2): qty 3

## 2019-08-04 MED ORDER — HYDRALAZINE HCL 20 MG/ML IJ SOLN
10.0000 mg | INTRAMUSCULAR | Status: DC | PRN
  Administered 2019-08-09 – 2019-08-17 (×5): 10 mg via INTRAVENOUS
  Filled 2019-08-04 (×7): qty 1

## 2019-08-04 MED ORDER — DEXTROSE 50 % IV SOLN
INTRAVENOUS | Status: AC
  Filled 2019-08-04: qty 50

## 2019-08-04 MED ORDER — HEPARIN SODIUM (PORCINE) 5000 UNIT/ML IJ SOLN
5000.0000 [IU] | Freq: Three times a day (TID) | INTRAMUSCULAR | Status: DC
  Administered 2019-08-04 – 2019-08-17 (×40): 5000 [IU] via SUBCUTANEOUS
  Filled 2019-08-04 (×40): qty 1

## 2019-08-04 MED ORDER — SODIUM CHLORIDE 0.9 % IV SOLN
25.0000 mg | Freq: Once | INTRAVENOUS | Status: AC
  Administered 2019-08-04: 25 mg via INTRAVENOUS
  Filled 2019-08-04: qty 1

## 2019-08-04 MED ORDER — SODIUM CHLORIDE 0.9 % IV SOLN
100.0000 mg | Freq: Every day | INTRAVENOUS | Status: AC
  Administered 2019-08-05 – 2019-08-08 (×4): 100 mg via INTRAVENOUS
  Filled 2019-08-04 (×4): qty 20

## 2019-08-04 MED ORDER — AMLODIPINE BESYLATE 10 MG PO TABS
10.0000 mg | ORAL_TABLET | Freq: Every day | ORAL | Status: DC
  Administered 2019-08-04 – 2019-08-14 (×8): 10 mg via ORAL
  Filled 2019-08-04 (×11): qty 1

## 2019-08-04 MED ORDER — SENNOSIDES-DOCUSATE SODIUM 8.6-50 MG PO TABS
1.0000 | ORAL_TABLET | Freq: Every evening | ORAL | Status: DC | PRN
  Administered 2019-08-13: 1 via ORAL
  Filled 2019-08-04: qty 1

## 2019-08-04 MED ORDER — SODIUM CHLORIDE 0.9 % IV SOLN
100.0000 mg | INTRAVENOUS | Status: AC
  Administered 2019-08-04 (×2): 100 mg via INTRAVENOUS
  Filled 2019-08-04 (×2): qty 20

## 2019-08-04 MED ORDER — HYDROCOD POLST-CPM POLST ER 10-8 MG/5ML PO SUER: 5.0000 mL | Freq: Two times a day (BID) | ORAL | Status: DC | PRN

## 2019-08-04 MED ORDER — ASPIRIN 81 MG PO CHEW
81.0000 mg | CHEWABLE_TABLET | Freq: Every day | ORAL | Status: DC
  Administered 2019-08-04 – 2019-08-17 (×11): 81 mg via ORAL
  Filled 2019-08-04 (×13): qty 1

## 2019-08-04 NOTE — Progress Notes (Deleted)
PROGRESS NOTE    Lucas Hobbs  NID:[id number] DOB: 1955/07/01 DOA: 08/03/2019 PCP: Lucas Garter, MD   Brief Narrative:  HPI per Lucas Hobbs is a 64 y.o. male with history of diabetes mellitus type 2, chronic kidney disease stage IV, hypertension, CAD status post CABG in 2019, hypertension presented to the ER because of persistent nausea vomiting and diarrhea with the last 1 week.  Patient states he has been having persistent vomiting with no blood in it and occasional diarrhea.  Denied any abdominal pain.  Had been a constant hiccups.  He still takes insulin.  When EMS was called in CBG was around 14.  Was given D10 and Zofran.  Blood sugar improved to 114.  ED Course: In the ER patient blood sugar again dropped around 60s and lab work show creatinine of 2.9 AST 78 platelets 132 EKG normal sinus rhythm chest x-ray unremarkable.  Covid test pending.  On exam abdomen appears benign.  Patient was started on D5 normal saline and admitted for hypoglycemia and persistent nausea vomiting.  With hiccups.  Assessment & Plan:   Principal Problem:   Hypoglycemia Active Problems:   CKD (chronic kidney disease), stage IV (HCC)   Nausea & vomiting   Essential hypertension   DM (diabetes mellitus), type 2 with renal complications (HCC)   CAD (coronary artery disease)   COVID-19  1 COVID-19 infection Patient had presented with nausea vomiting and diarrhea and noted to be hypoglycemic on presentation.  CT abdomen and pelvis which was done showed possible infiltrates in the lung concerning for Covid pneumonia.  COVID-19 PCR positive.  Inflammatory markers are ferritin, CRP, D-dimer noted to be elevated.  Patient started on IV remdesivir and Decadron.  Patient also placed empirically on IV Unasyn due to concern for possible aspiration.  Continue Combivent inhaler, zinc, vitamin C.  2.  Intractable nausea vomiting diarrhea Likely secondary to problem #1.  CT abdomen and pelvis with no  acute abnormalities to explain nausea and vomiting.  Improved clinically with treatment of #1.  Patient on clear liquid diet asking for food and has been advanced to a carb modified diet.  Supportive care.  3.  Hypertension Continue Norvasc and Toprol-XL.  ACE and beta on hold secondary to renal function.  4.  Coronary artery disease status post CABG/elevated troponin Currently asymptomatic.  Troponin is elevated however seems to have plateaued.  2D echo obtained with EF of 55 to 60%, no wall motion abnormalities.  Elevated troponin likely demand ischemia secondary to problem #1.  Continue home regimen of Toprol-XL, Norvasc, statin.  5.  Diabetes mellitus type 2 Hemoglobin A1c 7.5.  Patient noted to be hypoglycemic on presentation which was felt secondary to poor oral intake in the setting of ongoing insulin.  Patient initially was placed on D5 however due to elevated blood glucose levels D5 has been discontinued patient on normal saline.  Patient also on steroids and as such CBGs elevated.  Blood glucose level of 300 this morning.  Patient's diet has just been started as such we will hold off on NPH.  We will start patient on Lantus 15 units daily, sliding scale insulin and uptitrate as needed.  6.  Chronic kidney disease stage IV Renal function currently stable around baseline.  .   DVT prophylaxis: Heparin Code Status: Full Family Communication: Updated patient.  No family at bedside. Disposition Plan:  . Patient came from: Home            .  Anticipated d/c place: Home. . Barriers to d/c OR conditions which need to be met to effect a safe d/c: Likely home when clinically improved, completion of IV remdesivir.   Consultants:   None  Procedures:   CT renal stone protocol 08/04/2019  Chest x-ray 08/03/2019  Abdominal ultrasound 08/04/2019  2D echo 08/04/2019    Antimicrobials:  IV Unasyn 08/04/2019     Subjective: No further nausea or emesis. Hiccups improved. Asking for  food. Diarrhea improving per patient.  Objective: Vitals:   08/04/19 0700 08/04/19 0800 08/04/19 0900 08/04/19 1000  BP: (!) 150/73 125/80 (!) 151/72 (!) 166/81  Pulse: (!) 59 69 70 (!) 59  Resp: (!) '28 19 17 17  ' Temp:      TempSrc:      SpO2: 95% 97% 92% 91%  Weight:      Height:       No intake or output data in the 24 hours ending 08/04/19 1101 Filed Weights   08/04/19 0538  Weight: 85 kg    Examination:  General exam: Appears calm and comfortable  Respiratory system: Clear to auscultation. Respiratory effort normal. Cardiovascular system: S1 & S2 heard, RRR. No JVD, murmurs, rubs, gallops or clicks. No pedal edema. Gastrointestinal system: Abdomen is nondistended, soft and nontender. No organomegaly or masses felt. Normal bowel sounds heard. Central nervous system: Alert and oriented. No focal neurological deficits. Extremities: Symmetric 5 x 5 power. Skin: No rashes, lesions or ulcers Psychiatry: Judgement and insight appear normal. Mood & affect appropriate.     Data Reviewed: I have personally reviewed following labs and imaging studies  CBC: Recent Labs  Lab 08/03/19 2321 08/04/19 0456  WBC 6.4 5.3  NEUTROABS  --  4.0  HGB 14.1 13.3  HCT 42.2 40.1  MCV 87.9 87.9  PLT 132* 694*   Basic Metabolic Panel: Recent Labs  Lab 08/03/19 2321 08/04/19 0456  NA 139 139  K 3.9 3.6  CL 101 104  CO2 26 25  GLUCOSE 140* 166*  BUN 72* 70*  CREATININE 2.99* 3.01*  CALCIUM 8.1* 7.8*   GFR: Estimated Creatinine Clearance: 26.2 mL/min (A) (by C-G formula based on SCr of 3.01 mg/dL (H)). Liver Function Tests: Recent Labs  Lab 08/03/19 2321 08/04/19 0456  AST 78* 68*  ALT 37 34  ALKPHOS 46 43  BILITOT 1.1 1.2  PROT 6.2* 5.4*  ALBUMIN 3.1* 2.7*   No results for input(s): LIPASE, AMYLASE in the last 168 hours. No results for input(s): AMMONIA in the last 168 hours. Coagulation Profile: No results for input(s): INR, PROTIME in the last 168 hours. Cardiac  Enzymes: No results for input(s): CKTOTAL, CKMB, CKMBINDEX, TROPONINI in the last 168 hours. BNP (last 3 results) No results for input(s): PROBNP in the last 8760 hours. HbA1C: No results for input(s): HGBA1C in the last 72 hours. CBG: Recent Labs  Lab 08/03/19 2117 08/04/19 0050 08/04/19 0258 08/04/19 0455 08/04/19 0621  GLUCAP 137* 65* 127* 164* 158*   Lipid Profile: No results for input(s): CHOL, HDL, LDLCALC, TRIG, CHOLHDL, LDLDIRECT in the last 72 hours. Thyroid Function Tests: No results for input(s): TSH, T4TOTAL, FREET4, T3FREE, THYROIDAB in the last 72 hours. Anemia Panel: Recent Labs    08/04/19 0807  FERRITIN 634*   Sepsis Labs: Recent Labs  Lab 08/04/19 0807  PROCALCITON 0.21    Recent Results (from the past 240 hour(s))  SARS CORONAVIRUS 2 (TAT 6-24 HRS) Nasopharyngeal Nasopharyngeal Swab     Status: Abnormal   Collection  Time: 08/04/19 12:53 AM   Specimen: Nasopharyngeal Swab  Result Value Ref Range Status   SARS Coronavirus 2 POSITIVE (A) NEGATIVE Final    Comment: RESULT CALLED TO, READ BACK BY AND VERIFIED WITH: Ivin Poot 2130 08/04/2019 T. TYSOR (NOTE) SARS-CoV-2 target nucleic acids are DETECTED. The SARS-CoV-2 RNA is generally detectable in upper and lower respiratory specimens during the acute phase of infection. Positive results are indicative of the presence of SARS-CoV-2 RNA. Clinical correlation with patient history and other diagnostic information is  necessary to determine patient infection status. Positive results do not rule out bacterial infection or co-infection with other viruses.  The expected result is Negative. Fact Sheet for Patients: SugarRoll.be Fact Sheet for Healthcare Providers: https://www.woods-mathews.com/ This test is not yet approved or cleared by the [place] FDA and  has been authorized for detection and/or diagnosis of SARS-CoV-2 by FDA under an Emergency Use  Authorization (EUA). This EUA will remain  in effect (meaning this test can be used) for th e duration of the COVID-19 declaration under Section 564(b)(1) of the Act, 21 U.S.C. section 360bbb-3(b)(1), unless the authorization is terminated or revoked sooner. Performed at [facility], [place] [address], [place], [place] [address]          Radiology Studies: DG Chest 2 View  Result Date: 08/03/2019 CLINICAL DATA:  Vomiting, short of breath, hiccups EXAM: CHEST - 2 VIEW COMPARISON:  None. FINDINGS: Frontal and lateral views of the chest demonstrate an unremarkable cardiac silhouette. Postsurgical changes are seen from bypass surgery. No airspace disease, effusion, or pneumothorax. No acute bony abnormalities. IMPRESSION: 1. No acute intrathoracic process. Electronically Signed   By: Randa Ngo M.D.   On: 08/03/2019 23:22   US Abdomen Complete  Result Date: 08/04/2019 CLINICAL DATA:  Nausea and vomiting for 7 days EXAM: ABDOMEN ULTRASOUND COMPLETE COMPARISON:  CT from earlier today FINDINGS: Gallbladder: Cholelithiasis. No wall thickening or focal tenderness. Common bile duct: Diameter: 4 mm Liver: No focal lesion identified. Within normal limits in parenchymal echogenicity. Portal vein is patent on color Doppler imaging with normal direction of blood flow towards the liver. IVC: No abnormality visualized. Pancreas: Obscured. Spleen: Size and appearance within normal limits. Right Kidney: Length: 12 cm. Right lower pole cyst measuring 19 mm, correlating with hemorrhagic cyst on prior CT. A thin benign-appearing septation is present. Left Kidney: Length: 11.6 cm. Echogenicity within normal limits. No mass or hydronephrosis visualized. Abdominal aorta: No aneurysm visualized.  Atherosclerosis IMPRESSION: 1. No additional finding when compared to CT earlier the same day. 2. Cholelithiasis. Electronically Signed   By: Monte Fantasia M.D.   On: 08/04/2019 06:38   CT RENAL STONE STUDY  Result  Date: 08/04/2019 CLINICAL DATA:  Flank pain with kidney stone suspected. EXAM: CT ABDOMEN AND PELVIS WITHOUT CONTRAST TECHNIQUE: Multidetector CT imaging of the abdomen and pelvis was performed following the standard protocol without IV contrast. COMPARISON:  12/07/2018 FINDINGS: Lower chest: Patchy airspace disease in the lower lungs. Coronary atherosclerosis. Hepatobiliary: No focal liver abnormality.Minimal layering high-density in the gallbladder consistent with calculi. No wall thickening or inflammatory changes. Pancreas: Unremarkable. Spleen: Unremarkable. Adrenals/Urinary Tract: Negative adrenals. No hydronephrosis or stone. Hyperdense mass at the lower pole right kidney that is stable at 19 mm, consistent with hemorrhagic cyst given density. There is thinning of the cortex at the upper pole on the left where there is a subtle cystic density. Minimal layering calcification in the urinary bladder towards the left, not mural calcification based on  prior. Stomach/Bowel:  No obstruction. No appendicitis. Vascular/Lymphatic: No acute vascular abnormality. Diffuse atherosclerotic calcification of the aorta and branch vessels. No mass or adenopathy. Reproductive:Negative Other: No ascites or pneumoperitoneum. Musculoskeletal: Spondylosis without acute finding These results will be called to the ordering clinician or representative by the Radiologist Assistant, and communication documented in the PACS or [employer]. IMPRESSION: 1. Patchy airspace disease in the lower lungs with inflammatory appearance. Please correlate with COVID-19 status. 2. No acute intra-abdominal finding. 3. Small volume layering calculi in the gallbladder and urinary bladder. 4.  Aortic Atherosclerosis (ICD10-I70.0).  Coronary atherosclerosis Electronically Signed   By: Monte Fantasia M.D.   On: 08/04/2019 05:11        Scheduled Meds: . amLODipine  10 mg Oral Daily  . vitamin C  500 mg Oral Daily  . aspirin  81 mg Oral Daily    . atorvastatin  40 mg Oral QPM  . dexamethasone (DECADRON) injection  6 mg Intravenous Daily  . dextrose      . famotidine  20 mg Oral Daily  . heparin  5,000 Units Subcutaneous Q8H  . Ipratropium-Albuterol  1 puff Inhalation Q6H  . metoprolol succinate  100 mg Oral QPM  . zinc sulfate  220 mg Oral Daily   Continuous Infusions: . ampicillin-sulbactam (UNASYN) IV Stopped (08/04/19 0748)  . dextrose 5% lactated ringers 100 mL/hr at 08/04/19 0310  . [START ON 08/05/2019] remdesivir 100 mg in NS 100 mL       LOS: 0 days    Time spent: 35 minutes    Irine Seal, MD [facility]   To contact the attending provider between 7A-7P or the covering provider during after hours 7P-7A, please log into the web site [email or web address] and access using universal [place] password for that web site. If you do not have the password, please call the hospital operator.  08/04/2019, 11:01 AM

## 2019-08-04 NOTE — H&P (Addendum)
History and Physical    Lucas Hobbs NLZ:[id number] DOB: 03/29/1956 DOA: 08/03/2019  PCP: Jiles Garter, MD  Patient coming from: Home.  Chief Complaint: Nausea vomiting and hiccups.  Low blood sugar.  HPI: Lucas Hobbs is a 64 y.o. male with history of diabetes mellitus type 2, chronic kidney disease stage IV, hypertension, CAD status post CABG in 2019, hypertension presents to the ER because of persistent nausea vomiting and diarrhea with the last 1 week.  Patient states he has been having persistent vomiting with no blood in it and occasional diarrhea.  Denies any abdominal pain.  Has been a constant hiccups.  He still takes insulin.  When EMS was called in CBG was around 11.  Was given D10 and Zofran.  Blood sugar improved to 114.  ED Course: In the ER patient blood sugar again dropped around 60s and lab work show creatinine of 2.9 AST 78 platelets 132 EKG normal sinus rhythm chest x-ray unremarkable.  Covid test pending.  On exam abdomen appears benign.  Patient was started on D5 normal saline and admitted for hypoglycemia and persistent nausea vomiting.  With hiccups.  Review of Systems: As per HPI, rest all negative.   Past Medical History:  Diagnosis Date  . Asperger's syndrome   . Chronic kidney disease    CKD Stage 3 05/08/2017  . Coronary artery disease   . Diabetes mellitus without complication (HCC)    IDDM  . Diabetic retinopathy ([place])   . Diabetic retinopathy ([place])   . Diabetic retinopathy ([place]) 04/17/2017  . Hyperlipidemia    mixed  . Hypertension   . NSVT (nonsustained ventricular tachycardia) ([place])   . Pulmonary nodule 03/12/2017    Past Surgical History:  Procedure Laterality Date  . CARDIAC CATHETERIZATION    . COLONOSCOPY W/ BIOPSIES  08/10/2016  . CORONARY ARTERY BYPASS GRAFT     CABG x2; Performed at [facility] 05/07/17  . RADICAL ORCHIECTOMY       reports that he has never smoked. He has never used smokeless tobacco. He reports previous alcohol use. He  reports that he does not use drugs.  Allergies  Allergen Reactions  . Iodine Hives    Family History  Problem Relation Age of Onset  . CVA Father   . Heart attack Maternal Uncle   . Heart attack Paternal Uncle     Prior to Admission medications   Medication Sig Start Date End Date Taking? Authorizing Provider  amLODipine (NORVASC) 10 MG tablet Take 10 mg by mouth daily. 08/06/17  Yes [provider]  aspirin 81 MG chewable tablet Chew 81 mg by mouth daily. 02/15/13  Yes [provider]  atorvastatin (LIPITOR) 40 MG tablet Take 40 mg by mouth every evening.  08/06/17  Yes [provider]  insulin NPH-regular Human (70-30) 100 UNIT/ML injection Inject 36-38 Units into the skin See admin instructions. 38 in the morning and 36 in the evening   Yes [provider]  lisinopril (ZESTRIL) 20 MG tablet Take 20 mg by mouth daily. 12/04/18  Yes [provider]  metoprolol succinate (TOPROL-XL) 100 MG 24 hr tablet Take 100 mg by mouth every evening.  07/03/17 08/03/19 Yes [provider]  HYDROcodone-acetaminophen (NORCO/VICODIN) 5-325 MG tablet Take 1-2 tablets by mouth every 6 (six) hours as needed for severe pain. Patient not taking: Reported on 08/03/2019 12/07/18   Joy, Shawn C, PA-C  ondansetron (ZOFRAN ODT) 4 MG disintegrating tablet Take 1 tablet (4 mg total) by mouth  every 8 (eight) hours as needed for nausea or vomiting. Patient not taking: Reported on 08/03/2019 12/07/18   Lorayne Bender, PA-C  tamsulosin (FLOMAX) 0.4 MG CAPS capsule Take 1 capsule (0.4 mg total) by mouth daily. Patient not taking: Reported on 08/03/2019 12/07/18   Lorayne Bender, PA-C    Physical Exam: Constitutional: Moderately built and nourished. Vitals:   08/04/19 0100 08/04/19 0200 08/04/19 0300 08/04/19 0400  BP: (!) 160/63 127/61 (!) 146/73 (!) 156/84  Pulse: 77 67 63 74  Resp: 20 (!) 30 (!) 27 (!) 27  Temp:      TempSrc:      SpO2: 93% 91% 93% 92%   Eyes: Nonicteric  no pallor. ENMT: No discharge from the ears eyes nose or mouth. Neck: No masses.  No neck rigidity. Respiratory: No rhonchi or crepitations. Cardiovascular: S1-S2 heard. Abdomen: Soft nontender bowel sounds present. Musculoskeletal: No edema. Skin: No rash. Neurologic: Alert awake oriented to time place and person.  Moves all extremities. Psychiatric: Appears normal but normal affect.   Labs on Admission: I have personally reviewed following labs and imaging studies  CBC: Recent Labs  Lab 08/03/19 2321  WBC 6.4  HGB 14.1  HCT 42.2  MCV 87.9  PLT 532*   Basic Metabolic Panel: Recent Labs  Lab 08/03/19 2321  NA 139  K 3.9  CL 101  CO2 26  GLUCOSE 140*  BUN 72*  CREATININE 2.99*  CALCIUM 8.1*   GFR: CrCl cannot be calculated (Unknown ideal weight.). Liver Function Tests: Recent Labs  Lab 08/03/19 2321  AST 78*  ALT 37  ALKPHOS 46  BILITOT 1.1  PROT 6.2*  ALBUMIN 3.1*   No results for input(s): LIPASE, AMYLASE in the last 168 hours. No results for input(s): AMMONIA in the last 168 hours. Coagulation Profile: No results for input(s): INR, PROTIME in the last 168 hours. Cardiac Enzymes: No results for input(s): CKTOTAL, CKMB, CKMBINDEX, TROPONINI in the last 168 hours. BNP (last 3 results) No results for input(s): PROBNP in the last 8760 hours. HbA1C: No results for input(s): HGBA1C in the last 72 hours. CBG: Recent Labs  Lab 08/03/19 2117 08/04/19 0050 08/04/19 0258  GLUCAP 137* 65* 127*   Lipid Profile: No results for input(s): CHOL, HDL, LDLCALC, TRIG, CHOLHDL, LDLDIRECT in the last 72 hours. Thyroid Function Tests: No results for input(s): TSH, T4TOTAL, FREET4, T3FREE, THYROIDAB in the last 72 hours. Anemia Panel: No results for input(s): VITAMINB12, FOLATE, FERRITIN, TIBC, IRON, RETICCTPCT in the last 72 hours. Urine analysis:    Component Value Date/Time   COLORURINE YELLOW 12/07/2018 1500   APPEARANCEUR CLOUDY (A) 12/07/2018 1500    LABSPEC 1.021 12/07/2018 1500   PHURINE 5.0 12/07/2018 1500   GLUCOSEU 150 (A) 12/07/2018 1500   HGBUR NEGATIVE 12/07/2018 1500   BILIRUBINUR NEGATIVE 12/07/2018 1500   KETONESUR NEGATIVE 12/07/2018 1500   PROTEINUR 30 (A) 12/07/2018 1500   NITRITE NEGATIVE 12/07/2018 1500   LEUKOCYTESUR NEGATIVE 12/07/2018 1500   Sepsis Labs: @LABRCNTIP (procalcitonin:4,lacticidven:4) )No results found for this or any previous visit (from the past 240 hour(s)).   Radiological Exams on Admission: DG Chest 2 View  Result Date: 08/03/2019 CLINICAL DATA:  Vomiting, short of breath, hiccups EXAM: CHEST - 2 VIEW COMPARISON:  None. FINDINGS: Frontal and lateral views of the chest demonstrate an unremarkable cardiac silhouette. Postsurgical changes are seen from bypass surgery. No airspace disease, effusion, or pneumothorax. No acute bony abnormalities. IMPRESSION: 1. No acute intrathoracic process. Electronically Signed   By:  Randa Ngo M.D.   On: 08/03/2019 23:22    EKG: Independently reviewed.  Normal sinus rhythm.  Assessment/Plan Principal Problem:   Hypoglycemia Active Problems:   CKD (chronic kidney disease), stage IV (HCC)   Nausea & vomiting   Essential hypertension   DM (diabetes mellitus), type 2 with renal complications (HCC)   CAD (coronary artery disease)    1. Hyperglycemia in the setting of nausea vomiting and diarrhea with poor oral intake and using insulin likely precipitating the hypoglycemia.  Presently on D5 normal closely follow CBGs.  Patient takes NPH 70/30 about 36 units twice daily which patient states he had taken yesterday.  Will hold off for now.  Hemoglobin A1c recently checked by primary care physician was around 7 2 weeks ago. 2. Intractable nausea vomiting and diarrhea will check CT abdomen pelvis.  And will have further plans based on the results. 3. Hypertension on amlodipine and Toprol.  Hold off lisinopril due to renal failure. 4. CAD status post CABG denies any  chest pain.  On aspirin statins and beta-blockers.  Denies any chest pain. 5. Chronic and disease stage IV creatinine appears to be at baseline. 6. Elevated LFTs we will follow CT results. 7. Thrombocytopenia follow CBC.  Addendum -CT abdomen pelvis shows features concerning for possible infiltrates in the lung and Covid pneumonia is under the differential.  Covid test is still pending.  Another concern was for possible gallstones and given that patient has nausea vomiting we will get a sonogram of the abdomen.  I have added Unasyn for possible aspiration.   Given that patient has hypoglycemia with CT scan showing possible pneumonia with gallstones will need close monitoring for any further deterioration in inpatient status.   DVT prophylaxis: Heparin. Code Status: Full code. Family Communication: Discussed with patient. Disposition Plan: Home. Consults called: None. Admission status: Inpatient.   Rise Patience MD [facility] Pager [phone].  If 7PM-7AM, please contact night-coverage [email or web address] Password [facility]  08/04/2019, 4:45 AM

## 2019-08-04 NOTE — Progress Notes (Signed)
Pharmacy Antibiotic Note  Lucas Hobbs is a 64 y.o. male admitted on 08/03/2019 with cellulitis.  Pharmacy has been consulted for unasyn dosing.  Plan: Unasyn 3 Gm IV q12h F/u ht/wt/scr     Temp (24hrs), Avg:97.7 F (36.5 C), Min:97.7 F (36.5 C), Max:97.7 F (36.5 C)  Recent Labs  Lab 08/03/19 2321 08/04/19 0456  WBC 6.4 5.3  CREATININE 2.99* 3.01*    CrCl cannot be calculated (Unknown ideal weight.).    Allergies  Allergen Reactions  . Iodine Hives    Antimicrobials this admission: 3/31 unasyn >>    >>   Dose adjustments this admission:   Microbiology results:  BCx:   UCx:    Sputum:    MRSA PCR:   Thank you for allowing pharmacy to be a part of this patient's care.  Dorrene German 08/04/2019 5:38 AM

## 2019-08-04 NOTE — ED Notes (Signed)
Spoke with Grandville Silos MD; post made aware of troponin, he said that he will place orders. [place] primary RN aware.

## 2019-08-04 NOTE — Progress Notes (Signed)
Resume care of patient at 1550 from previous RN. Agrees with previous RN assessments. Patient is currently resting  at this time,  call lights in place, bed at lowest position, items within reach. Will continue to monitor.

## 2019-08-04 NOTE — Progress Notes (Signed)
  Echocardiogram 2D Echocardiogram has been performed.  Lucas Hobbs A Rhythm Gubbels 08/04/2019, 1:51 PM

## 2019-08-04 NOTE — ED Notes (Signed)
Date and time results received: 08/04/19 0913    Test: troponin Critical Value: 227  Name of Provider Notified: Hospitalist paged  Orders Received? Or Actions Taken?: awaiting call back  [place] primary RN aware

## 2019-08-04 NOTE — Progress Notes (Signed)
PT demonstrated hands on understanding of Flutter device- NPC at this time. 

## 2019-08-04 NOTE — Progress Notes (Signed)
Blood sugar 312. Paged M. Sharlet Salina, NP. Awaiting orders.

## 2019-08-04 NOTE — Progress Notes (Signed)
Patient glucose 244.  RN notified MD.

## 2019-08-04 NOTE — Progress Notes (Addendum)
I have seen and assessed patient and I agree with Dr. Moise Boring assessment and plan.  Patient is a pleasant 64 year old gentleman history of diabetes mellitus type 2, chronic kidney disease stage IV, hypertension, coronary artery disease status post CABG in 2019, hypertension presented to the ED with nausea vomiting diarrhea x1 week.  Patient also noted to be hypoglycemic with CBG of 56.  Patient given D10 and placed on D5 IV fluids.  CT renal stone protocol with patchy airspace disease in the lower lungs with inflammatory appearance.  COVID-19 noted to be positive.  Patient started on IV remdesivir.  Renal ultrasound pending.  Will check inflammatory markers.  Please placed on IV Decadron, Combivent inhaler.  Patient placed empirically on IV Unasyn.  IV Thorazine x1 for hiccups.  If patient has ongoing diarrhea will check a C. difficile PCR otherwise diarrhea likely secondary to COVID-19.  Patient also noted with elevated troponin likely demand ischemia secondary to COVID-19.  Patient denies any ongoing chest pain.  Cycle cardiac enzymes.  Check a 2D echo.  If 2D echo is abnormal will need cardiology input.  Supportive care.  Follow.  No charge.

## 2019-08-05 DIAGNOSIS — I2510 Atherosclerotic heart disease of native coronary artery without angina pectoris: Secondary | ICD-10-CM

## 2019-08-05 LAB — URINE CULTURE: Culture: NO GROWTH

## 2019-08-05 LAB — CBC WITH DIFFERENTIAL/PLATELET
Abs Immature Granulocytes: 0.02 10*3/uL (ref 0.00–0.07)
Basophils Absolute: 0 10*3/uL (ref 0.0–0.1)
Basophils Relative: 0 %
Eosinophils Absolute: 0 10*3/uL (ref 0.0–0.5)
Eosinophils Relative: 0 %
HCT: 41.9 % (ref 39.0–52.0)
Hemoglobin: 13.1 g/dL (ref 13.0–17.0)
Immature Granulocytes: 1 %
Lymphocytes Relative: 12 %
Lymphs Abs: 0.4 10*3/uL — ABNORMAL LOW (ref 0.7–4.0)
MCH: 28.1 pg (ref 26.0–34.0)
MCHC: 31.3 g/dL (ref 30.0–36.0)
MCV: 89.9 fL (ref 80.0–100.0)
Monocytes Absolute: 0.3 10*3/uL (ref 0.1–1.0)
Monocytes Relative: 9 %
Neutro Abs: 3 10*3/uL (ref 1.7–7.7)
Neutrophils Relative %: 78 %
Platelets: 119 10*3/uL — ABNORMAL LOW (ref 150–400)
RBC: 4.66 MIL/uL (ref 4.22–5.81)
RDW: 13.1 % (ref 11.5–15.5)
WBC: 3.7 10*3/uL — ABNORMAL LOW (ref 4.0–10.5)
nRBC: 0 % (ref 0.0–0.2)

## 2019-08-05 LAB — HEMOGLOBIN A1C
Hgb A1c MFr Bld: 7.5 % — ABNORMAL HIGH (ref 4.8–5.6)
Mean Plasma Glucose: 168.55 mg/dL

## 2019-08-05 LAB — COMPREHENSIVE METABOLIC PANEL
ALT: 36 U/L (ref 0–44)
AST: 57 U/L — ABNORMAL HIGH (ref 15–41)
Albumin: 2.6 g/dL — ABNORMAL LOW (ref 3.5–5.0)
Alkaline Phosphatase: 42 U/L (ref 38–126)
Anion gap: 9 (ref 5–15)
BUN: 77 mg/dL — ABNORMAL HIGH (ref 8–23)
CO2: 24 mmol/L (ref 22–32)
Calcium: 7.8 mg/dL — ABNORMAL LOW (ref 8.9–10.3)
Chloride: 107 mmol/L (ref 98–111)
Creatinine, Ser: 2.78 mg/dL — ABNORMAL HIGH (ref 0.61–1.24)
GFR calc Af Amer: 27 mL/min — ABNORMAL LOW (ref >60)
GFR calc non Af Amer: 23 mL/min — ABNORMAL LOW (ref >60)
Glucose, Bld: 347 mg/dL — ABNORMAL HIGH (ref 70–99)
Potassium: 4.1 mmol/L (ref 3.5–5.1)
Sodium: 140 mmol/L (ref 135–145)
Total Bilirubin: 1.2 mg/dL (ref 0.3–1.2)
Total Protein: 5.3 g/dL — ABNORMAL LOW (ref 6.5–8.1)

## 2019-08-05 LAB — GLUCOSE, CAPILLARY
Glucose-Capillary: 300 mg/dL — ABNORMAL HIGH (ref 70–99)
Glucose-Capillary: 306 mg/dL — ABNORMAL HIGH (ref 70–99)
Glucose-Capillary: 324 mg/dL — ABNORMAL HIGH (ref 70–99)
Glucose-Capillary: 342 mg/dL — ABNORMAL HIGH (ref 70–99)
Glucose-Capillary: 347 mg/dL — ABNORMAL HIGH (ref 70–99)
Glucose-Capillary: 381 mg/dL — ABNORMAL HIGH (ref 70–99)

## 2019-08-05 LAB — PHOSPHORUS: Phosphorus: 3.8 mg/dL (ref 2.5–4.6)

## 2019-08-05 LAB — FERRITIN: Ferritin: 800 ng/mL — ABNORMAL HIGH (ref 24–336)

## 2019-08-05 LAB — D-DIMER, QUANTITATIVE: D-Dimer, Quant: 1.25 ug/mL-FEU — ABNORMAL HIGH (ref 0.00–0.50)

## 2019-08-05 LAB — MAGNESIUM: Magnesium: 2.5 mg/dL — ABNORMAL HIGH (ref 1.7–2.4)

## 2019-08-05 LAB — C-REACTIVE PROTEIN: CRP: 8.3 mg/dL — ABNORMAL HIGH (ref <1.0)

## 2019-08-05 MED ORDER — INSULIN GLARGINE 100 UNIT/ML ~~LOC~~ SOLN
15.0000 [IU] | Freq: Every day | SUBCUTANEOUS | Status: DC
  Administered 2019-08-05: 11:00:00 15 [IU] via SUBCUTANEOUS
  Filled 2019-08-05 (×2): qty 0.15

## 2019-08-05 MED ORDER — INSULIN ASPART 100 UNIT/ML ~~LOC~~ SOLN
0.0000 [IU] | Freq: Every day | SUBCUTANEOUS | Status: DC
  Administered 2019-08-05: 5 [IU] via SUBCUTANEOUS
  Administered 2019-08-06: 3 [IU] via SUBCUTANEOUS
  Administered 2019-08-08: 22:00:00 2 [IU] via SUBCUTANEOUS
  Administered 2019-08-09: 23:00:00 4 [IU] via SUBCUTANEOUS
  Administered 2019-08-10: 5 [IU] via SUBCUTANEOUS
  Administered 2019-08-11 – 2019-08-12 (×2): 3 [IU] via SUBCUTANEOUS
  Administered 2019-08-13: 23:00:00 2 [IU] via SUBCUTANEOUS

## 2019-08-05 MED ORDER — INSULIN ASPART 100 UNIT/ML ~~LOC~~ SOLN
0.0000 [IU] | Freq: Three times a day (TID) | SUBCUTANEOUS | Status: DC
  Administered 2019-08-05 (×3): 4 [IU] via SUBCUTANEOUS
  Administered 2019-08-06 (×3): 3 [IU] via SUBCUTANEOUS
  Administered 2019-08-07 (×2): 2 [IU] via SUBCUTANEOUS
  Administered 2019-08-07: 1 [IU] via SUBCUTANEOUS
  Administered 2019-08-08: 13:00:00 2 [IU] via SUBCUTANEOUS
  Administered 2019-08-08: 1 [IU] via SUBCUTANEOUS
  Administered 2019-08-09 (×3): 2 [IU] via SUBCUTANEOUS
  Administered 2019-08-10 (×2): 4 [IU] via SUBCUTANEOUS
  Administered 2019-08-10: 08:00:00 3 [IU] via SUBCUTANEOUS
  Administered 2019-08-11 (×3): 5 [IU] via SUBCUTANEOUS
  Administered 2019-08-12: 12:00:00 2 [IU] via SUBCUTANEOUS
  Administered 2019-08-12: 3 [IU] via SUBCUTANEOUS
  Administered 2019-08-12: 08:00:00 1 [IU] via SUBCUTANEOUS
  Administered 2019-08-13: 2 [IU] via SUBCUTANEOUS
  Administered 2019-08-15 – 2019-08-16 (×2): 1 [IU] via SUBCUTANEOUS

## 2019-08-05 NOTE — Progress Notes (Signed)
Ardith Dark, NP made aware of blood glucose level of 300. Orders received for diet advancement and sliding scale coverage.

## 2019-08-05 NOTE — Progress Notes (Addendum)
PROGRESS NOTE    Lucas Hobbs  YIF:[id number] DOB: 01-20-1956 DOA: 08/03/2019 PCP: Jiles Garter, MD   Brief Narrative:  HPI per Dr. Jimmy Picket Hobbs is a 64 y.o. male with history of diabetes mellitus type 2, chronic kidney disease stage IV, hypertension, CAD status post CABG in 2019, hypertension presented to the ER because of persistent nausea vomiting and diarrhea with the last 1 week.  Patient states he has been having persistent vomiting with no blood in it and occasional diarrhea.  Denied any abdominal pain.  Had been a constant hiccups.  He still takes insulin.  When EMS was called in CBG was around 56.  Was given D10 and Zofran.  Blood sugar improved to 114.  ED Course: In the ER patient blood sugar again dropped around 60s and lab work show creatinine of 2.9 AST 78 platelets 132 EKG normal sinus rhythm chest x-ray unremarkable.  Covid test pending.  On exam abdomen appears benign.  Patient was started on D5 normal saline and admitted for hypoglycemia and persistent nausea vomiting.  With hiccups.  Assessment & Plan:   Principal Problem:   Hypoglycemia Active Problems:   CKD (chronic kidney disease), stage IV (HCC)   Nausea & vomiting   Essential hypertension   DM (diabetes mellitus), type 2 with renal complications (HCC)   CAD (coronary artery disease)   COVID-19  1 COVID-19 infection Patient had presented with nausea vomiting and diarrhea and noted to be hypoglycemic on presentation.  CT abdomen and pelvis which was done showed possible infiltrates in the lung concerning for Covid pneumonia.  COVID-19 PCR positive.  Inflammatory markers are ferritin, CRP, D-dimer noted to be elevated.  Patient started on IV remdesivir and Decadron.  Patient also placed empirically on IV Unasyn due to concern for possible aspiration.  Continue Combivent inhaler, zinc, vitamin C.  2.  Intractable nausea vomiting diarrhea Likely secondary to problem #1.  CT abdomen and pelvis with no  acute abnormalities to explain nausea and vomiting.  Improved clinically with treatment of #1.  Patient on clear liquid diet asking for food and has been advanced to a carb modified diet.  Supportive care.  3.  Hypertension Continue Norvasc and Toprol-XL.  ACE and beta on hold secondary to renal function.  4.  Coronary artery disease status post CABG/elevated troponin Currently asymptomatic.  Troponin is elevated however seems to have plateaued.  2D echo obtained with EF of 55 to 60%, no wall motion abnormalities.  Elevated troponin likely demand ischemia secondary to problem #1.  Continue home regimen of Toprol-XL, Norvasc, statin.  5.  Diabetes mellitus type 2 Hemoglobin A1c 7.5.  Patient noted to be hypoglycemic on presentation which was felt secondary to poor oral intake in the setting of ongoing insulin.  Patient initially was placed on D5 however due to elevated blood glucose levels D5 has been discontinued patient on normal saline.  Patient also on steroids and as such CBGs elevated.  Blood glucose level of 300 this morning.  Patient's diet has just been started as such we will hold off on NPH.  We will start patient on Lantus 15 units daily, sliding scale insulin and uptitrate as needed.  6.  Chronic kidney disease stage IV Renal function currently stable around baseline.  .   DVT prophylaxis: Heparin Code Status: Full Family Communication: Updated patient. Updated wife on telephone.   Disposition Plan:  . Patient came from: Home            .  Anticipated d/c place: Home. . Barriers to d/c OR conditions which need to be met to effect a safe d/c: Likely home when clinically improved, completion of IV remdesivir.   Consultants:   None  Procedures:   CT renal stone protocol 08/04/2019  Chest x-ray 08/03/2019  Abdominal ultrasound 08/04/2019  2D echo 08/04/2019    Antimicrobials:  IV Unasyn 08/04/2019     Subjective: No further nausea or emesis. Hiccups improved.  Asking for food. Diarrhea improving per patient.  Objective: Vitals:   08/04/19 2030 08/05/19 0421 08/05/19 1106 08/05/19 1250  BP: 125/66 135/67 133/69 137/60  Pulse: 61 (!) 57  (!) 58  Resp: (!) 21 (!) 21  20  Temp: 98.3 F (36.8 C) 98.1 F (36.7 C)  97.8 F (36.6 C)  TempSrc: Oral Oral  Oral  SpO2: 97% 91%  96%  Weight:      Height:        Intake/Output Summary (Last 24 hours) at 08/05/2019 1845 Last data filed at 08/05/2019 1435 Gross per 24 hour  Intake 160 ml  Output --  Net 160 ml   Filed Weights   08/04/19 0538  Weight: 85 kg    Examination:  General exam: Appears calm and comfortable  Respiratory system: Clear to auscultation. Respiratory effort normal. Cardiovascular system: S1 & S2 heard, RRR. No JVD, murmurs, rubs, gallops or clicks. No pedal edema. Gastrointestinal system: Abdomen is nondistended, soft and nontender. No organomegaly or masses felt. Normal bowel sounds heard. Central nervous system: Alert and oriented. No focal neurological deficits. Extremities: Symmetric 5 x 5 power. Skin: No rashes, lesions or ulcers Psychiatry: Judgement and insight appear normal. Mood & affect appropriate.     Data Reviewed: I have personally reviewed following labs and imaging studies  CBC: Recent Labs  Lab 08/03/19 2321 08/04/19 0456 08/04/19 1102 08/05/19 0302  WBC 6.4 5.3 4.6 3.7*  NEUTROABS  --  4.0  --  3.0  HGB 14.1 13.3 14.0 13.1  HCT 42.2 40.1 43.1 41.9  MCV 87.9 87.9 88.9 89.9  PLT 132* 119* 121* 096*   Basic Metabolic Panel: Recent Labs  Lab 08/03/19 2321 08/04/19 0456 08/04/19 1102 08/05/19 0302  NA 139 139  --  140  K 3.9 3.6  --  4.1  CL 101 104  --  107  CO2 26 25  --  24  GLUCOSE 140* 166*  --  347*  BUN 72* 70*  --  77*  CREATININE 2.99* 3.01* 2.74* 2.78*  CALCIUM 8.1* 7.8*  --  7.8*  MG  --   --   --  2.5*  PHOS  --   --   --  3.8   GFR: Estimated Creatinine Clearance: 28.4 mL/min (A) (by C-G formula based on SCr of 2.78  mg/dL (H)). Liver Function Tests: Recent Labs  Lab 08/03/19 2321 08/04/19 0456 08/05/19 0302  AST 78* 68* 57*  ALT 37 34 36  ALKPHOS 46 43 42  BILITOT 1.1 1.2 1.2  PROT 6.2* 5.4* 5.3*  ALBUMIN 3.1* 2.7* 2.6*   No results for input(s): LIPASE, AMYLASE in the last 168 hours. No results for input(s): AMMONIA in the last 168 hours. Coagulation Profile: No results for input(s): INR, PROTIME in the last 168 hours. Cardiac Enzymes: No results for input(s): CKTOTAL, CKMB, CKMBINDEX, TROPONINI in the last 168 hours. BNP (last 3 results) No results for input(s): PROBNP in the last 8760 hours. HbA1C: Recent Labs    08/05/19 0302  HGBA1C 7.5*  CBG: Recent Labs  Lab 08/05/19 0016 08/05/19 0417 08/05/19 0758 08/05/19 1246 08/05/19 1712  GLUCAP 306* 300* 324* 347* 342*   Lipid Profile: No results for input(s): CHOL, HDL, LDLCALC, TRIG, CHOLHDL, LDLDIRECT in the last 72 hours. Thyroid Function Tests: No results for input(s): TSH, T4TOTAL, FREET4, T3FREE, THYROIDAB in the last 72 hours. Anemia Panel: Recent Labs    08/04/19 1102 08/05/19 0302  FERRITIN 707* 800*   Sepsis Labs: Recent Labs  Lab 08/04/19 0807 08/04/19 1102  PROCALCITON 0.21 0.17    Recent Results (from the past 240 hour(s))  SARS CORONAVIRUS 2 (TAT 6-24 HRS) Nasopharyngeal Nasopharyngeal Swab     Status: Abnormal   Collection Time: 08/04/19 12:53 AM   Specimen: Nasopharyngeal Swab  Result Value Ref Range Status   SARS Coronavirus 2 POSITIVE (A) NEGATIVE Final    Comment: RESULT CALLED TO, READ BACK BY AND VERIFIED WITH: Ivin Poot 1700 08/04/2019 T. TYSOR (NOTE) SARS-CoV-2 target nucleic acids are DETECTED. The SARS-CoV-2 RNA is generally detectable in upper and lower respiratory specimens during the acute phase of infection. Positive results are indicative of the presence of SARS-CoV-2 RNA. Clinical correlation with patient history and other diagnostic information is  necessary to determine  patient infection status. Positive results do not rule out bacterial infection or co-infection with other viruses.  The expected result is Negative. Fact Sheet for Patients: SugarRoll.be Fact Sheet for Healthcare Providers: https://www.woods-mathews.com/ This test is not yet approved or cleared by the [place] FDA and  has been authorized for detection and/or diagnosis of SARS-CoV-2 by FDA under an Emergency Use Authorization (EUA). This EUA will remain  in effect (meaning this test can be used) for th e duration of the COVID-19 declaration under Section 564(b)(1) of the Act, 21 U.S.C. section 360bbb-3(b)(1), unless the authorization is terminated or revoked sooner. Performed at [facility], [place] [address], [place], [place] [address]   Respiratory Panel by PCR     Status: None   Collection Time: 08/04/19 12:53 AM   Specimen: Nasopharyngeal Swab; Respiratory  Result Value Ref Range Status   Adenovirus NOT DETECTED NOT DETECTED Final   Coronavirus 229E NOT DETECTED NOT DETECTED Final    Comment: (NOTE) The Coronavirus on the Respiratory Panel, DOES NOT test for the novel  Coronavirus (2019 nCoV)    Coronavirus HKU1 NOT DETECTED NOT DETECTED Final   Coronavirus NL63 NOT DETECTED NOT DETECTED Final   Coronavirus OC43 NOT DETECTED NOT DETECTED Final   Metapneumovirus NOT DETECTED NOT DETECTED Final   Rhinovirus / Enterovirus NOT DETECTED NOT DETECTED Final   Influenza A NOT DETECTED NOT DETECTED Final   Influenza B NOT DETECTED NOT DETECTED Final   Parainfluenza Virus 1 NOT DETECTED NOT DETECTED Final   Parainfluenza Virus 2 NOT DETECTED NOT DETECTED Final   Parainfluenza Virus 3 NOT DETECTED NOT DETECTED Final   Parainfluenza Virus 4 NOT DETECTED NOT DETECTED Final   Respiratory Syncytial Virus NOT DETECTED NOT DETECTED Final   Bordetella pertussis NOT DETECTED NOT DETECTED Final   Chlamydophila pneumoniae NOT DETECTED NOT  DETECTED Final   Mycoplasma pneumoniae NOT DETECTED NOT DETECTED Final    Comment: Performed at [facility], [place]. [address], [place], [place] [address]  Culture, Urine     Status: None   Collection Time: 08/04/19  3:08 PM   Specimen: Urine, Random  Result Value Ref Range Status   Specimen Description   Final    URINE, RANDOM Performed at [facility]  [facility], [place] [address], [place], [place] [address]    Special Requests   Final    NONE Performed at [facility], [place] [address] [place], [place] [address]    Culture   Final    NO GROWTH Performed at [facility], [place] [address] [place], [place] [address]    Report Status 08/05/2019 FINAL  Final         Radiology Studies: DG Chest 2 View  Result Date: 08/03/2019 CLINICAL DATA:  Vomiting, short of breath, hiccups EXAM: CHEST - 2 VIEW COMPARISON:  None. FINDINGS: Frontal and lateral views of the chest demonstrate an unremarkable cardiac silhouette. Postsurgical changes are seen from bypass surgery. No airspace disease, effusion, or pneumothorax. No acute bony abnormalities. IMPRESSION: 1. No acute intrathoracic process. Electronically Signed   By: Randa Ngo M.D.   On: 08/03/2019 23:22   US Abdomen Complete  Result Date: 08/04/2019 CLINICAL DATA:  Nausea and vomiting for 7 days EXAM: ABDOMEN ULTRASOUND COMPLETE COMPARISON:  CT from earlier today FINDINGS: Gallbladder: Cholelithiasis. No wall thickening or focal tenderness. Common bile duct: Diameter: 4 mm Liver: No focal lesion identified. Within normal limits in parenchymal echogenicity. Portal vein is patent on color Doppler imaging with normal direction of blood flow towards the liver. IVC: No abnormality visualized. Pancreas: Obscured. Spleen: Size and appearance within normal limits. Right Kidney: Length: 12 cm. Right lower pole cyst measuring 19 mm, correlating with hemorrhagic cyst on prior CT. A thin benign-appearing septation is  present. Left Kidney: Length: 11.6 cm. Echogenicity within normal limits. No mass or hydronephrosis visualized. Abdominal aorta: No aneurysm visualized.  Atherosclerosis IMPRESSION: 1. No additional finding when compared to CT earlier the same day. 2. Cholelithiasis. Electronically Signed   By: Monte Fantasia M.D.   On: 08/04/2019 06:38   ECHOCARDIOGRAM COMPLETE  Result Date: 08/04/2019    ECHOCARDIOGRAM REPORT   Patient Name:   Lucas Hobbs Date of Exam: 08/04/2019 Medical Rec #:  [id number]   Height:       67.0 in Accession #:    [id number]  Weight:       187.4 lb Date of Birth:  [birth year]  BSA:          1.967 m Patient Age:    26 years    BP:           166/71 mmHg Patient Gender: M           HR:           75 bpm. Exam Location:  Inpatient Procedure: 2D Echo Indications:    Elevated Troponin  History:        Patient has no prior history of Echocardiogram examinations.                 Risk Factors:Diabetes and Hypertension. CKD                 Covid19 infection.  Sonographer:    Vikki Ports Turrentine Referring Phys: [employer]  1. Left ventricular ejection fraction, by estimation, is 55 to 60%. The left ventricle has normal function. The left ventricle has no regional wall motion abnormalities. There is moderate concentric left ventricular hypertrophy. Left ventricular diastolic parameters are indeterminate.  2. Right ventricular systolic function is normal. The right ventricular size is normal.  3. The mitral valve is normal in structure. Trivial mitral valve regurgitation. No evidence of mitral stenosis.  4. The aortic valve has an indeterminant number  of cusps. Aortic valve regurgitation is not visualized. No aortic stenosis is present.  5. The inferior vena cava is normal in size with greater than 50% respiratory variability, suggesting right atrial pressure of 3 mmHg. Comparison(s): No prior Echocardiogram. FINDINGS  Left Ventricle: Left ventricular ejection fraction, by estimation, is  55 to 60%. The left ventricle has normal function. The left ventricle has no regional wall motion abnormalities. The left ventricular internal cavity size was normal in size. There is  moderate concentric left ventricular hypertrophy. Left ventricular diastolic parameters are indeterminate. Right Ventricle: The right ventricular size is normal. No increase in right ventricular wall thickness. Right ventricular systolic function is normal. Left Atrium: Left atrial size was normal in size. Right Atrium: Right atrial size was normal in size. Pericardium: There is no evidence of pericardial effusion. Mitral Valve: The mitral valve is normal in structure. Trivial mitral valve regurgitation. No evidence of mitral valve stenosis. Tricuspid Valve: The tricuspid valve is normal in structure. Tricuspid valve regurgitation is trivial. No evidence of tricuspid stenosis. Aortic Valve: The aortic valve has an indeterminant number of cusps. Aortic valve regurgitation is not visualized. No aortic stenosis is present. There is mild calcification of the aortic valve. Pulmonic Valve: The pulmonic valve was not well visualized. Pulmonic valve regurgitation is not visualized. No evidence of pulmonic stenosis. Aorta: The aortic root, ascending aorta and aortic arch are all structurally normal, with no evidence of dilitation or obstruction. Pulmonary Artery: The pulmonary artery is not well seen. Venous: The inferior vena cava is normal in size with greater than 50% respiratory variability, suggesting right atrial pressure of 3 mmHg. IAS/Shunts: No atrial level shunt detected by color flow Doppler.  LEFT VENTRICLE PLAX 2D LVIDd:         4.78 cm  Diastology LVIDs:         3.10 cm  LV e' lateral:   7.72 cm/s LV PW:         1.51 cm  LV E/e' lateral: 10.5 LV IVS:        1.76 cm  LV e' medial:    5.00 cm/s LVOT diam:     1.80 cm  LV E/e' medial:  16.2 LV SV:         50 LV SV Index:   25 LVOT Area:     2.54 cm  RIGHT VENTRICLE RV S prime:      15.10 cm/s TAPSE (M-mode): 1.8 cm LEFT ATRIUM             Index       RIGHT ATRIUM           Index LA diam:        2.80 cm 1.42 cm/m  RA Area:     12.80 cm LA Vol (A2C):   51.5 ml 26.18 ml/m RA Volume:   26.90 ml  13.68 ml/m LA Vol (A4C):   30.3 ml 15.40 ml/m LA Biplane Vol: 39.9 ml 20.28 ml/m  AORTIC VALVE LVOT Vmax:   111.00 cm/s LVOT Vmean:  78.600 cm/s LVOT VTI:    0.196 m  AORTA Ao Root diam: 3.40 cm MITRAL VALVE MV Area (PHT): 4.36 cm    SHUNTS MV Decel Time: 174 msec    Systemic VTI:  0.20 m MV E velocity: 81.00 cm/s  Systemic Diam: 1.80 cm MV A velocity: 92.40 cm/s MV E/A ratio:  0.88 Buford Dresser MD Electronically signed by Buford Dresser MD Signature Date/Time: 08/04/2019/6:42:54 PM    Final  CT RENAL STONE STUDY  Result Date: 08/04/2019 CLINICAL DATA:  Flank pain with kidney stone suspected. EXAM: CT ABDOMEN AND PELVIS WITHOUT CONTRAST TECHNIQUE: Multidetector CT imaging of the abdomen and pelvis was performed following the standard protocol without IV contrast. COMPARISON:  12/07/2018 FINDINGS: Lower chest: Patchy airspace disease in the lower lungs. Coronary atherosclerosis. Hepatobiliary: No focal liver abnormality.Minimal layering high-density in the gallbladder consistent with calculi. No wall thickening or inflammatory changes. Pancreas: Unremarkable. Spleen: Unremarkable. Adrenals/Urinary Tract: Negative adrenals. No hydronephrosis or stone. Hyperdense mass at the lower pole right kidney that is stable at 19 mm, consistent with hemorrhagic cyst given density. There is thinning of the cortex at the upper pole on the left where there is a subtle cystic density. Minimal layering calcification in the urinary bladder towards the left, not mural calcification based on prior. Stomach/Bowel:  No obstruction. No appendicitis. Vascular/Lymphatic: No acute vascular abnormality. Diffuse atherosclerotic calcification of the aorta and branch vessels. No mass or adenopathy.  Reproductive:Negative Other: No ascites or pneumoperitoneum. Musculoskeletal: Spondylosis without acute finding These results will be called to the ordering clinician or representative by the Radiologist Assistant, and communication documented in the PACS or [employer]. IMPRESSION: 1. Patchy airspace disease in the lower lungs with inflammatory appearance. Please correlate with COVID-19 status. 2. No acute intra-abdominal finding. 3. Small volume layering calculi in the gallbladder and urinary bladder. 4.  Aortic Atherosclerosis (ICD10-I70.0).  Coronary atherosclerosis Electronically Signed   By: Monte Fantasia M.D.   On: 08/04/2019 05:11        Scheduled Meds: . amLODipine  10 mg Oral Daily  . vitamin C  500 mg Oral Daily  . aspirin  81 mg Oral Daily  . atorvastatin  40 mg Oral QPM  . dexamethasone (DECADRON) injection  6 mg Intravenous Daily  . famotidine  20 mg Oral Daily  . heparin  5,000 Units Subcutaneous Q8H  . insulin aspart  0-5 Units Subcutaneous QHS  . insulin aspart  0-6 Units Subcutaneous TID WC  . insulin glargine  15 Units Subcutaneous Daily  . Ipratropium-Albuterol  1 puff Inhalation Q6H  . metoprolol succinate  100 mg Oral QPM  . zinc sulfate  220 mg Oral Daily   Continuous Infusions: . sodium chloride 100 mL/hr at 08/05/19 1047  . ampicillin-sulbactam (UNASYN) IV 3 g (08/05/19 1733)  . remdesivir 100 mg in NS 100 mL Stopped (08/05/19 1140)     LOS: 1 day    Time spent: 35 minutes    Irine Seal, MD [facility]   To contact the attending provider between 7A-7P or the covering provider during after hours 7P-7A, please log into the web site [email or web address] and access using universal [place] password for that web site. If you do not have the password, please call the hospital operator.  08/05/2019, 6:45 PM

## 2019-08-05 NOTE — Plan of Care (Signed)

## 2019-08-06 ENCOUNTER — Inpatient Hospital Stay (HOSPITAL_COMMUNITY): Payer: 59

## 2019-08-06 LAB — COMPREHENSIVE METABOLIC PANEL
ALT: 39 U/L (ref 0–44)
AST: 60 U/L — ABNORMAL HIGH (ref 15–41)
Albumin: 2.8 g/dL — ABNORMAL LOW (ref 3.5–5.0)
Alkaline Phosphatase: 46 U/L (ref 38–126)
Anion gap: 16 — ABNORMAL HIGH (ref 5–15)
BUN: 73 mg/dL — ABNORMAL HIGH (ref 8–23)
CO2: 19 mmol/L — ABNORMAL LOW (ref 22–32)
Calcium: 8.1 mg/dL — ABNORMAL LOW (ref 8.9–10.3)
Chloride: 109 mmol/L (ref 98–111)
Creatinine, Ser: 2.53 mg/dL — ABNORMAL HIGH (ref 0.61–1.24)
GFR calc Af Amer: 30 mL/min — ABNORMAL LOW (ref >60)
GFR calc non Af Amer: 26 mL/min — ABNORMAL LOW (ref >60)
Glucose, Bld: 318 mg/dL — ABNORMAL HIGH (ref 70–99)
Potassium: 4.2 mmol/L (ref 3.5–5.1)
Sodium: 144 mmol/L (ref 135–145)
Total Bilirubin: 0.7 mg/dL (ref 0.3–1.2)
Total Protein: 5.8 g/dL — ABNORMAL LOW (ref 6.5–8.1)

## 2019-08-06 LAB — CBC WITH DIFFERENTIAL/PLATELET
Abs Immature Granulocytes: 0.03 10*3/uL (ref 0.00–0.07)
Basophils Absolute: 0 10*3/uL (ref 0.0–0.1)
Basophils Relative: 0 %
Eosinophils Absolute: 0 10*3/uL (ref 0.0–0.5)
Eosinophils Relative: 0 %
HCT: 46.5 % (ref 39.0–52.0)
Hemoglobin: 14.6 g/dL (ref 13.0–17.0)
Immature Granulocytes: 0 %
Lymphocytes Relative: 7 %
Lymphs Abs: 0.6 10*3/uL — ABNORMAL LOW (ref 0.7–4.0)
MCH: 28.3 pg (ref 26.0–34.0)
MCHC: 31.4 g/dL (ref 30.0–36.0)
MCV: 90.3 fL (ref 80.0–100.0)
Monocytes Absolute: 0.8 10*3/uL (ref 0.1–1.0)
Monocytes Relative: 9 %
Neutro Abs: 7.5 10*3/uL (ref 1.7–7.7)
Neutrophils Relative %: 84 %
Platelets: 157 10*3/uL (ref 150–400)
RBC: 5.15 MIL/uL (ref 4.22–5.81)
RDW: 12.9 % (ref 11.5–15.5)
WBC: 8.9 10*3/uL (ref 4.0–10.5)
nRBC: 0 % (ref 0.0–0.2)

## 2019-08-06 LAB — GLUCOSE, CAPILLARY
Glucose-Capillary: 273 mg/dL — ABNORMAL HIGH (ref 70–99)
Glucose-Capillary: 285 mg/dL — ABNORMAL HIGH (ref 70–99)
Glucose-Capillary: 296 mg/dL — ABNORMAL HIGH (ref 70–99)
Glucose-Capillary: 294 mg/dL — ABNORMAL HIGH (ref 70–99)
Glucose-Capillary: 263 mg/dL — ABNORMAL HIGH (ref 70–99)

## 2019-08-06 LAB — AMMONIA: Ammonia: 14 umol/L (ref 9–35)

## 2019-08-06 LAB — C-REACTIVE PROTEIN: CRP: 5.5 mg/dL — ABNORMAL HIGH (ref <1.0)

## 2019-08-06 LAB — PHOSPHORUS: Phosphorus: 3.7 mg/dL (ref 2.5–4.6)

## 2019-08-06 LAB — D-DIMER, QUANTITATIVE: D-Dimer, Quant: 0.83 ug/mL-FEU — ABNORMAL HIGH (ref 0.00–0.50)

## 2019-08-06 LAB — MAGNESIUM: Magnesium: 2.7 mg/dL — ABNORMAL HIGH (ref 1.7–2.4)

## 2019-08-06 LAB — FERRITIN: Ferritin: 1366 ng/mL — ABNORMAL HIGH (ref 24–336)

## 2019-08-06 MED ORDER — AMOXICILLIN-POT CLAVULANATE 875-125 MG PO TABS
1.0000 | ORAL_TABLET | Freq: Two times a day (BID) | ORAL | Status: DC
  Administered 2019-08-06 – 2019-08-08 (×4): 1 via ORAL
  Filled 2019-08-06 (×4): qty 1

## 2019-08-06 MED ORDER — LORAZEPAM 2 MG/ML IJ SOLN
0.5000 mg | INTRAMUSCULAR | Status: AC | PRN
  Administered 2019-08-06 – 2019-08-07 (×2): 0.5 mg via INTRAVENOUS
  Filled 2019-08-06 (×2): qty 1

## 2019-08-06 MED ORDER — LINAGLIPTIN 5 MG PO TABS
5.0000 mg | ORAL_TABLET | Freq: Every day | ORAL | Status: DC
  Administered 2019-08-06 – 2019-08-14 (×6): 5 mg via ORAL
  Filled 2019-08-06 (×8): qty 1

## 2019-08-06 MED ORDER — INSULIN GLARGINE 100 UNIT/ML ~~LOC~~ SOLN
22.0000 [IU] | Freq: Every day | SUBCUTANEOUS | Status: DC
  Administered 2019-08-06: 22 [IU] via SUBCUTANEOUS
  Filled 2019-08-06 (×2): qty 0.22

## 2019-08-06 MED ORDER — SODIUM CHLORIDE 0.9 % IV SOLN
3.0000 g | Freq: Three times a day (TID) | INTRAVENOUS | Status: DC
  Administered 2019-08-06: 13:00:00 3 g via INTRAVENOUS
  Filled 2019-08-06: qty 3
  Filled 2019-08-06 (×2): qty 8

## 2019-08-06 NOTE — Progress Notes (Signed)
Called patient wife with updates.

## 2019-08-06 NOTE — Progress Notes (Signed)
Pharmacy Antibiotic Note  Lucas Hobbs is a 64 y.o. male admitted on 08/03/2019 with hypoglycemia .  Pharmacy has been consulted for unasyn dosing. 08/06/2019 D#3/5 remdesivir for Covid: D#3 Unasyn for possible aspiration WBC WNL, SCr down to 2.53, CrCl ~ 31 ml/min.  AF ALT WNL, CRP 5.5.   Plan: Increase Unasyn 3 Gm IV q8h- ? Transition to PO D3/5 Remdesivir  Height: 5\' 7"  (170.2 cm) Weight: 85 kg (187 lb 6.3 oz) IBW/kg (Calculated) : 66.1  Temp (24hrs), Avg:97.8 F (36.6 C), Min:97.7 F (36.5 C), Max:98 F (36.7 C)  Recent Labs  Lab 08/03/19 2321 08/04/19 0456 08/04/19 1102 08/05/19 0302 08/06/19 0321  WBC 6.4 5.3 4.6 3.7* 8.9  CREATININE 2.99* 3.01* 2.74* 2.78* 2.53*    Estimated Creatinine Clearance: 31.2 mL/min (A) (by C-G formula based on SCr of 2.53 mg/dL (H)).    Allergies  Allergen Reactions  . Iodine Hives   Antimicrobials this admission: 3/31 unasyn >>  3/31 remdesivir>>  (4/4) Dose adjustments this admission: 4/1 incr Unasyn to 3 gm q8 Microbiology results: 3/31 UCx: ngF 3/31 Covid + 3/31 resp panel neg   Thank you for allowing pharmacy to be a part of this patient's care.  Eudelia Bunch, Pharm.D [phone] 08/06/2019 8:27 AM

## 2019-08-06 NOTE — Progress Notes (Signed)
Pt becoming increasingly agitated as the night pogresses, pulled out IV @ 0200, continues to pull tele box off.

## 2019-08-06 NOTE — Progress Notes (Addendum)
PROGRESS NOTE    Lucas Hobbs  XYI:[id number] DOB: Nov 16, 1955 DOA: 08/03/2019 PCP: Lucas Garter, MD   Brief Narrative:  HPI per Dr. Jimmy Picket Hobbs is a 64 y.o. male with history of diabetes mellitus type 2, chronic kidney disease stage IV, hypertension, CAD status post CABG in 2019, hypertension presented to the ER because of persistent nausea vomiting and diarrhea with the last 1 week.  Patient states he has been having persistent vomiting with no blood in it and occasional diarrhea.  Denied any abdominal pain.  Had been a constant hiccups.  He still takes insulin.  When EMS was called in CBG was around 30.  Was given D10 and Zofran.  Blood sugar improved to 114.  ED Course: In the ER patient blood sugar again dropped around 60s and lab work show creatinine of 2.9 AST 78 platelets 132 EKG normal sinus rhythm chest x-ray unremarkable.  Covid test pending.  On exam abdomen appears benign.  Patient was started on D5 normal saline and admitted for hypoglycemia and persistent nausea vomiting.  With hiccups.  Assessment & Plan:   Principal Problem:   Hypoglycemia Active Problems:   CKD (chronic kidney disease), stage IV (HCC)   Nausea & vomiting   Essential hypertension   DM (diabetes mellitus), type 2 with renal complications (HCC)   CAD (coronary artery disease)   COVID-19  1 COVID-19 infection Patient had presented with nausea vomiting and diarrhea and noted to be hypoglycemic on presentation.  CT abdomen and pelvis which was done showed possible infiltrates in the lung concerning for Covid pneumonia.  COVID-19 PCR positive.  Inflammatory markers are ferritin, CRP, D-dimer noted to be elevated.  CRP and D-dimer trending down.  Ferritin fluctuating.  Continue IV remdesivir and Decadron.  Patient was started empirically on IV Unasyn due to concerns for aspiration.  Transition from IV Unasyn to Augmentin to complete a 5 to 7-day course of antibiotic treatment.  Continue Combivent  inhaler, zinc, vitamin C.  Mobilize.  2.  Intractable nausea vomiting diarrhea Likely secondary to problem #1.  CT abdomen and pelvis with no acute abnormalities to explain nausea and vomiting.  Improved clinically with treatment of #1.  Patient's diet has been advanced and currently tolerating a carb modified diet.  Supportive care.  3.  Hypertension Continue Norvasc and Toprol-XL.  ACE inhibitor on hold secondary to renal function.  4.  Coronary artery disease status post CABG/elevated troponin Currently asymptomatic.  Troponin is elevated however seems to have plateaued.  2D echo obtained with EF of 55 to 60%, no wall motion abnormalities.  Elevated troponin likely demand ischemia secondary to problem #1.  Continue home regimen of Toprol-XL, Norvasc, statin.  5.  Diabetes mellitus type 2 Hemoglobin A1c 7.5.  Patient noted to be hypoglycemic on presentation which was felt secondary to poor oral intake in the setting of ongoing insulin.  Patient initially was placed on D5 however due to elevated blood glucose levels D5 has been discontinued patient on normal saline.  Patient also on steroids and as such CBGs elevated.  Blood glucose level of 273 this morning.  Increase Lantus to 22 units daily.  Continue sliding scale insulin.  Add Tradjenta 5 mg daily.   6.  Chronic kidney disease stage IV Renal function currently stable around baseline.  Saline lock IV fluids.  7.?  Confusion Per RN patient with some bouts of confusion.  Could be secondary to COVID-19 infection.  Check an ammonia level.  CT head without  contrast.  Supportive care.  .   DVT prophylaxis: Heparin Code Status: Full Family Communication: Updated patient.  No family at bedside. Disposition Plan:  . Patient came from: Home            . Anticipated d/c place: Home. . Barriers to d/c OR conditions which need to be met to effect a safe d/c: Likely home when clinically improved, completion of IV remdesivir.   Consultants:    None  Procedures:   CT renal stone protocol 08/04/2019  Chest x-ray 08/03/2019  Abdominal ultrasound 08/04/2019  2D echo 08/04/2019    Antimicrobials:  IV Unasyn 08/04/2019>>>> 08/06/2019  Augmentin 08/06/2019   Subjective: Laying in bed.  Denies any nausea or emesis.  Normal diarrhea.  Tolerating oral intake.  Denies any chest pain or shortness of breath.  No further nausea or emesis. Hiccups improved. Asking for food. Diarrhea improving per patient.  Objective: Vitals:   08/05/19 1106 08/05/19 1250 08/05/19 2027 08/06/19 0444  BP: 133/69 137/60 139/63 (!) 145/73  Pulse:  (!) 58 (!) 53 63  Resp:  '20 16 16  ' Temp:  97.8 F (36.6 C) 98 F (36.7 C) 97.7 F (36.5 C)  TempSrc:  Oral  Oral  SpO2:  96% 91% 90%  Weight:      Height:        Intake/Output Summary (Last 24 hours) at 08/06/2019 1124 Last data filed at 08/06/2019 0500 Gross per 24 hour  Intake 2698.94 ml  Output 875 ml  Net 1823.94 ml   Filed Weights   08/04/19 0538  Weight: 85 kg    Examination:  General exam: NAD  Respiratory system: CTA B.  No wheezes, no crackles, no rhonchi.  Normal respiratory effort.   Cardiovascular system: Regular rate rhythm no murmurs rubs or gallops.  No JVD.  No lower extremity edema.  Gastrointestinal system: Abdomen is soft, nontender, nondistended, positive bowel sounds.  No rebound.  No guarding.  Central nervous system: Alert and oriented. No focal neurological deficits. Extremities: Symmetric 5 x 5 power. Skin: No rashes, lesions or ulcers Psychiatry: Judgement and insight appear normal. Mood & affect appropriate.     Data Reviewed: I have personally reviewed following labs and imaging studies  CBC: Recent Labs  Lab 08/03/19 2321 08/04/19 0456 08/04/19 1102 08/05/19 0302 08/06/19 0321  WBC 6.4 5.3 4.6 3.7* 8.9  NEUTROABS  --  4.0  --  3.0 7.5  HGB 14.1 13.3 14.0 13.1 14.6  HCT 42.2 40.1 43.1 41.9 46.5  MCV 87.9 87.9 88.9 89.9 90.3  PLT 132* 119* 121* 119*  536   Basic Metabolic Panel: Recent Labs  Lab 08/03/19 2321 08/04/19 0456 08/04/19 1102 08/05/19 0302 08/06/19 0321  NA 139 139  --  140 144  K 3.9 3.6  --  4.1 4.2  CL 101 104  --  107 109  CO2 26 25  --  24 19*  GLUCOSE 140* 166*  --  347* 318*  BUN 72* 70*  --  77* 73*  CREATININE 2.99* 3.01* 2.74* 2.78* 2.53*  CALCIUM 8.1* 7.8*  --  7.8* 8.1*  MG  --   --   --  2.5* 2.7*  PHOS  --   --   --  3.8 3.7   GFR: Estimated Creatinine Clearance: 31.2 mL/min (A) (by C-G formula based on SCr of 2.53 mg/dL (H)). Liver Function Tests: Recent Labs  Lab 08/03/19 2321 08/04/19 0456 08/05/19 0302 08/06/19 0321  AST 78* 68* 57* 60*  ALT 37 34 36 39  ALKPHOS 46 43 42 46  BILITOT 1.1 1.2 1.2 0.7  PROT 6.2* 5.4* 5.3* 5.8*  ALBUMIN 3.1* 2.7* 2.6* 2.8*   No results for input(s): LIPASE, AMYLASE in the last 168 hours. No results for input(s): AMMONIA in the last 168 hours. Coagulation Profile: No results for input(s): INR, PROTIME in the last 168 hours. Cardiac Enzymes: No results for input(s): CKTOTAL, CKMB, CKMBINDEX, TROPONINI in the last 168 hours. BNP (last 3 results) No results for input(s): PROBNP in the last 8760 hours. HbA1C: Recent Labs    08/05/19 0302  HGBA1C 7.5*   CBG: Recent Labs  Lab 08/05/19 0758 08/05/19 1246 08/05/19 1712 08/05/19 2033 08/06/19 0751  GLUCAP 324* 347* 342* 381* 273*   Lipid Profile: No results for input(s): CHOL, HDL, LDLCALC, TRIG, CHOLHDL, LDLDIRECT in the last 72 hours. Thyroid Function Tests: No results for input(s): TSH, T4TOTAL, FREET4, T3FREE, THYROIDAB in the last 72 hours. Anemia Panel: Recent Labs    08/05/19 0302 08/06/19 0321  FERRITIN 800* 1,366*   Sepsis Labs: Recent Labs  Lab 08/04/19 0807 08/04/19 1102  PROCALCITON 0.21 0.17    Recent Results (from the past 240 hour(s))  SARS CORONAVIRUS 2 (TAT 6-24 HRS) Nasopharyngeal Nasopharyngeal Swab     Status: Abnormal   Collection Time: 08/04/19 12:53 AM    Specimen: Nasopharyngeal Swab  Result Value Ref Range Status   SARS Coronavirus 2 POSITIVE (A) NEGATIVE Final    Comment: RESULT CALLED TO, READ BACK BY AND VERIFIED WITH: Ivin Poot [id number] 08/04/2019 T. TYSOR (NOTE) SARS-CoV-2 target nucleic acids are DETECTED. The SARS-CoV-2 RNA is generally detectable in upper and lower respiratory specimens during the acute phase of infection. Positive results are indicative of the presence of SARS-CoV-2 RNA. Clinical correlation with patient history and other diagnostic information is  necessary to determine patient infection status. Positive results do not rule out bacterial infection or co-infection with other viruses.  The expected result is Negative. Fact Sheet for Patients: SugarRoll.be Fact Sheet for Healthcare Providers: https://www.woods-mathews.com/ This test is not yet approved or cleared by the [place] FDA and  has been authorized for detection and/or diagnosis of SARS-CoV-2 by FDA under an Emergency Use Authorization (EUA). This EUA will remain  in effect (meaning this test can be used) for th e duration of the COVID-19 declaration under Section 564(b)(1) of the Act, 21 U.S.C. section 360bbb-3(b)(1), unless the authorization is terminated or revoked sooner. Performed at [facility], [place] [address] [place], [place] [address]   Respiratory Panel by PCR     Status: None   Collection Time: 08/04/19 12:53 AM   Specimen: Nasopharyngeal Swab; Respiratory  Result Value Ref Range Status   Adenovirus NOT DETECTED NOT DETECTED Final   Coronavirus 229E NOT DETECTED NOT DETECTED Final    Comment: (NOTE) The Coronavirus on the Respiratory Panel, DOES NOT test for the novel  Coronavirus (2019 nCoV)    Coronavirus HKU1 NOT DETECTED NOT DETECTED Final   Coronavirus NL63 NOT DETECTED NOT DETECTED Final   Coronavirus OC43 NOT DETECTED NOT DETECTED Final   Metapneumovirus NOT DETECTED NOT  DETECTED Final   Rhinovirus / Enterovirus NOT DETECTED NOT DETECTED Final   Influenza A NOT DETECTED NOT DETECTED Final   Influenza B NOT DETECTED NOT DETECTED Final   Parainfluenza Virus 1 NOT DETECTED NOT DETECTED Final   Parainfluenza Virus 2 NOT DETECTED NOT DETECTED Final   Parainfluenza Virus 3 NOT DETECTED NOT DETECTED Final  Parainfluenza Virus 4 NOT DETECTED NOT DETECTED Final   Respiratory Syncytial Virus NOT DETECTED NOT DETECTED Final   Bordetella pertussis NOT DETECTED NOT DETECTED Final   Chlamydophila pneumoniae NOT DETECTED NOT DETECTED Final   Mycoplasma pneumoniae NOT DETECTED NOT DETECTED Final    Comment: Performed at [facility], [place] [address], [address], [place] [address]  Culture, Urine     Status: None   Collection Time: 08/04/19  3:08 PM   Specimen: Urine, Random  Result Value Ref Range Status   Specimen Description   Final    URINE, RANDOM Performed at [place] [address], [place], [place] [address]    Special Requests   Final    NONE Performed at [facility], [place] [address], [place], [place] [address]    Culture   Final    NO GROWTH Performed at [facility], [place] [address], [place], [place] [address]    Report Status 08/05/2019 FINAL  Final         Radiology Studies: ECHOCARDIOGRAM COMPLETE  Result Date: 08/04/2019    ECHOCARDIOGRAM REPORT   Patient Name:   Lucas Hobbs Date of Exam: 08/04/2019 Medical Rec #:  [id number]   Height:       67.0 in Accession #:    [id number]  Weight:       187.4 lb Date of Birth:  12/07/1955  BSA:          1.967 m Patient Age:    62 years    BP:           166/71 mmHg Patient Gender: M           HR:           75 bpm. Exam Location:  Inpatient Procedure: 2D Echo Indications:    Elevated Troponin  History:        Patient has no prior history of Echocardiogram examinations.                 Risk Factors:Diabetes and Hypertension. CKD                 Covid19 infection.   Sonographer:    Vikki Ports Turrentine Referring Phys: Elmer  1. Left ventricular ejection fraction, by estimation, is 55 to 60%. The left ventricle has normal function. The left ventricle has no regional wall motion abnormalities. There is moderate concentric left ventricular hypertrophy. Left ventricular diastolic parameters are indeterminate.  2. Right ventricular systolic function is normal. The right ventricular size is normal.  3. The mitral valve is normal in structure. Trivial mitral valve regurgitation. No evidence of mitral stenosis.  4. The aortic valve has an indeterminant number of cusps. Aortic valve regurgitation is not visualized. No aortic stenosis is present.  5. The inferior vena cava is normal in size with greater than 50% respiratory variability, suggesting right atrial pressure of 3 mmHg. Comparison(s): No prior Echocardiogram. FINDINGS  Left Ventricle: Left ventricular ejection fraction, by estimation, is 55 to 60%. The left ventricle has normal function. The left ventricle has no regional wall motion abnormalities. The left ventricular internal cavity size was normal in size. There is  moderate concentric left ventricular hypertrophy. Left ventricular diastolic parameters are indeterminate. Right Ventricle: The right ventricular size is normal. No increase in right ventricular wall thickness. Right ventricular systolic function is normal. Left Atrium: Left atrial size was normal in size. Right Atrium: Right atrial size was  normal in size. Pericardium: There is no evidence of pericardial effusion. Mitral Valve: The mitral valve is normal in structure. Trivial mitral valve regurgitation. No evidence of mitral valve stenosis. Tricuspid Valve: The tricuspid valve is normal in structure. Tricuspid valve regurgitation is trivial. No evidence of tricuspid stenosis. Aortic Valve: The aortic valve has an indeterminant number of cusps. Aortic valve regurgitation is not  visualized. No aortic stenosis is present. There is mild calcification of the aortic valve. Pulmonic Valve: The pulmonic valve was not well visualized. Pulmonic valve regurgitation is not visualized. No evidence of pulmonic stenosis. Aorta: The aortic root, ascending aorta and aortic arch are all structurally normal, with no evidence of dilitation or obstruction. Pulmonary Artery: The pulmonary artery is not well seen. Venous: The inferior vena cava is normal in size with greater than 50% respiratory variability, suggesting right atrial pressure of 3 mmHg. IAS/Shunts: No atrial level shunt detected by color flow Doppler.  LEFT VENTRICLE PLAX 2D LVIDd:         4.78 cm  Diastology LVIDs:         3.10 cm  LV e' lateral:   7.72 cm/s LV PW:         1.51 cm  LV E/e' lateral: 10.5 LV IVS:        1.76 cm  LV e' medial:    5.00 cm/s LVOT diam:     1.80 cm  LV E/e' medial:  16.2 LV SV:         50 LV SV Index:   25 LVOT Area:     2.54 cm  RIGHT VENTRICLE RV S prime:     15.10 cm/s TAPSE (M-mode): 1.8 cm LEFT ATRIUM             Index       RIGHT ATRIUM           Index LA diam:        2.80 cm 1.42 cm/m  RA Area:     12.80 cm LA Vol (A2C):   51.5 ml 26.18 ml/m RA Volume:   26.90 ml  13.68 ml/m LA Vol (A4C):   30.3 ml 15.40 ml/m LA Biplane Vol: 39.9 ml 20.28 ml/m  AORTIC VALVE LVOT Vmax:   111.00 cm/s LVOT Vmean:  78.600 cm/s LVOT VTI:    0.196 m  AORTA Ao Root diam: 3.40 cm MITRAL VALVE MV Area (PHT): 4.36 cm    SHUNTS MV Decel Time: 174 msec    Systemic VTI:  0.20 m MV E velocity: 81.00 cm/s  Systemic Diam: 1.80 cm MV A velocity: 92.40 cm/s MV E/A ratio:  0.88 Buford Dresser MD Electronically signed by Buford Dresser MD Signature Date/Time: 08/04/2019/6:42:54 PM    Final         Scheduled Meds: . amLODipine  10 mg Oral Daily  . vitamin C  500 mg Oral Daily  . aspirin  81 mg Oral Daily  . atorvastatin  40 mg Oral QPM  . dexamethasone (DECADRON) injection  6 mg Intravenous Daily  . famotidine  20  mg Oral Daily  . heparin  5,000 Units Subcutaneous Q8H  . insulin aspart  0-5 Units Subcutaneous QHS  . insulin aspart  0-6 Units Subcutaneous TID WC  . insulin glargine  15 Units Subcutaneous Daily  . Ipratropium-Albuterol  1 puff Inhalation Q6H  . metoprolol succinate  100 mg Oral QPM  . zinc sulfate  220 mg Oral Daily   Continuous Infusions: . sodium chloride 100  mL/hr at 08/05/19 1047  . ampicillin-sulbactam (UNASYN) IV    . remdesivir 100 mg in NS 100 mL 100 mg (08/06/19 0927)     LOS: 2 days    Time spent: 35 minutes    Irine Seal, MD [facility]   To contact the attending provider between 7A-7P or the covering provider during after hours 7P-7A, please log into the web site [email or web address] and access using universal [place] password for that web site. If you do not have the password, please call the hospital operator.  08/06/2019, 11:24 AM

## 2019-08-06 NOTE — Progress Notes (Signed)
Inpatient Diabetes Program Recommendations  AACE/ADA: New Consensus Statement on Inpatient Glycemic Control (2015)  Target Ranges:  Prepandial:   less than 140 mg/dL      Peak postprandial:   less than 180 mg/dL (1-2 hours)      Critically ill patients:  140 - 180 mg/dL   Lab Results  Component Value Date   GLUCAP 273 (H) 08/06/2019   HGBA1C 7.5 (H) 08/05/2019    Review of Glycemic Control Results for Lucas Hobbs, Lucas Hobbs (MRN [id number]) as of 08/06/2019 09:58  Ref. Range 08/05/2019 07:58 08/05/2019 12:46 08/05/2019 17:12 08/05/2019 20:33 08/06/2019 07:51  Glucose-Capillary Latest Ref Range: 70 - 99 mg/dL 324 (H) 347 (H) 342 (H) 381 (H) 273 (H)   Diabetes history: DM 2 Outpatient Diabetes medications: 70/30 38 units qam, 36 units qpm Current orders for Inpatient glycemic control:  Lantus 15 units Daily Novolog 0-6 units tid + hs  Decadron 6 mg Daily  Inpatient Diabetes Program Recommendations:    - Increase Lantus to 22 units. Give additional units if dose already given.  - Add Tradjenta 5 mg Daily  Thanks,  Tama Headings RN, MSN, BC-ADM Inpatient Diabetes Coordinator Team Pager [phone] (8a-5p)

## 2019-08-07 ENCOUNTER — Inpatient Hospital Stay (HOSPITAL_COMMUNITY): Payer: 59

## 2019-08-07 DIAGNOSIS — J9601 Acute respiratory failure with hypoxia: Secondary | ICD-10-CM

## 2019-08-07 DIAGNOSIS — U071 COVID-19: Secondary | ICD-10-CM

## 2019-08-07 LAB — COMPREHENSIVE METABOLIC PANEL
ALT: 46 U/L — ABNORMAL HIGH (ref 0–44)
AST: 89 U/L — ABNORMAL HIGH (ref 15–41)
Albumin: 2.7 g/dL — ABNORMAL LOW (ref 3.5–5.0)
Alkaline Phosphatase: 48 U/L (ref 38–126)
Anion gap: 8 (ref 5–15)
BUN: 79 mg/dL — ABNORMAL HIGH (ref 8–23)
CO2: 24 mmol/L (ref 22–32)
Calcium: 8 mg/dL — ABNORMAL LOW (ref 8.9–10.3)
Chloride: 114 mmol/L — ABNORMAL HIGH (ref 98–111)
Creatinine, Ser: 2.22 mg/dL — ABNORMAL HIGH (ref 0.61–1.24)
GFR calc Af Amer: 35 mL/min — ABNORMAL LOW (ref >60)
GFR calc non Af Amer: 30 mL/min — ABNORMAL LOW (ref >60)
Glucose, Bld: 203 mg/dL — ABNORMAL HIGH (ref 70–99)
Potassium: 4.2 mmol/L (ref 3.5–5.1)
Sodium: 146 mmol/L — ABNORMAL HIGH (ref 135–145)
Total Bilirubin: 1 mg/dL (ref 0.3–1.2)
Total Protein: 5.2 g/dL — ABNORMAL LOW (ref 6.5–8.1)

## 2019-08-07 LAB — URINALYSIS, ROUTINE W REFLEX MICROSCOPIC
Bilirubin Urine: NEGATIVE
Glucose, UA: 150 mg/dL — AB
Ketones, ur: NEGATIVE mg/dL
Leukocytes,Ua: NEGATIVE
Nitrite: NEGATIVE
Protein, ur: NEGATIVE mg/dL
Specific Gravity, Urine: 1.012 (ref 1.005–1.030)
pH: 5 (ref 5.0–8.0)

## 2019-08-07 LAB — CBC WITH DIFFERENTIAL/PLATELET
Abs Immature Granulocytes: 0.13 10*3/uL — ABNORMAL HIGH (ref 0.00–0.07)
Basophils Absolute: 0 10*3/uL (ref 0.0–0.1)
Basophils Relative: 0 %
Eosinophils Absolute: 0 10*3/uL (ref 0.0–0.5)
Eosinophils Relative: 0 %
HCT: 38.5 % — ABNORMAL LOW (ref 39.0–52.0)
Hemoglobin: 12.8 g/dL — ABNORMAL LOW (ref 13.0–17.0)
Immature Granulocytes: 1 %
Lymphocytes Relative: 5 %
Lymphs Abs: 0.5 10*3/uL — ABNORMAL LOW (ref 0.7–4.0)
MCH: 28.8 pg (ref 26.0–34.0)
MCHC: 33.2 g/dL (ref 30.0–36.0)
MCV: 86.7 fL (ref 80.0–100.0)
Monocytes Absolute: 0.8 10*3/uL (ref 0.1–1.0)
Monocytes Relative: 8 %
Neutro Abs: 8.4 10*3/uL — ABNORMAL HIGH (ref 1.7–7.7)
Neutrophils Relative %: 86 %
Platelets: 157 10*3/uL (ref 150–400)
RBC: 4.44 MIL/uL (ref 4.22–5.81)
RDW: 13.2 % (ref 11.5–15.5)
WBC: 9.9 10*3/uL (ref 4.0–10.5)
nRBC: 0 % (ref 0.0–0.2)

## 2019-08-07 LAB — PROCALCITONIN: Procalcitonin: <0.1 ng/mL

## 2019-08-07 LAB — D-DIMER, QUANTITATIVE: D-Dimer, Quant: 0.77 ug/mL-FEU — ABNORMAL HIGH (ref 0.00–0.50)

## 2019-08-07 LAB — BRAIN NATRIURETIC PEPTIDE: B Natriuretic Peptide: 127.2 pg/mL — ABNORMAL HIGH (ref 0.0–100.0)

## 2019-08-07 LAB — GLUCOSE, CAPILLARY
Glucose-Capillary: 211 mg/dL — ABNORMAL HIGH (ref 70–99)
Glucose-Capillary: 200 mg/dL — ABNORMAL HIGH (ref 70–99)
Glucose-Capillary: 204 mg/dL — ABNORMAL HIGH (ref 70–99)
Glucose-Capillary: 294 mg/dL — ABNORMAL HIGH (ref 70–99)
Glucose-Capillary: 149 mg/dL — ABNORMAL HIGH (ref 70–99)

## 2019-08-07 LAB — MAGNESIUM: Magnesium: 2.8 mg/dL — ABNORMAL HIGH (ref 1.7–2.4)

## 2019-08-07 LAB — C-REACTIVE PROTEIN: CRP: 2.2 mg/dL — ABNORMAL HIGH (ref <1.0)

## 2019-08-07 LAB — FERRITIN: Ferritin: 1139 ng/mL — ABNORMAL HIGH (ref 24–336)

## 2019-08-07 LAB — PHOSPHORUS: Phosphorus: 3.7 mg/dL (ref 2.5–4.6)

## 2019-08-07 MED ORDER — RISPERIDONE 0.5 MG PO TBDP
0.5000 mg | ORAL_TABLET | Freq: Two times a day (BID) | ORAL | Status: DC
  Administered 2019-08-07 (×2): 0.5 mg via ORAL
  Filled 2019-08-07 (×3): qty 1

## 2019-08-07 MED ORDER — LORAZEPAM 2 MG/ML IJ SOLN
2.0000 mg | Freq: Once | INTRAMUSCULAR | Status: AC
  Administered 2019-08-07: 2 mg via INTRAVENOUS
  Filled 2019-08-07: qty 1

## 2019-08-07 MED ORDER — HALOPERIDOL LACTATE 5 MG/ML IJ SOLN
2.0000 mg | INTRAMUSCULAR | Status: DC | PRN
  Administered 2019-08-07 (×3): 2 mg via INTRAMUSCULAR
  Filled 2019-08-07 (×2): qty 1

## 2019-08-07 MED ORDER — LORAZEPAM 2 MG/ML IJ SOLN: 2.0000 mg | Freq: Once | INTRAMUSCULAR | Status: DC

## 2019-08-07 MED ORDER — INSULIN GLARGINE 100 UNIT/ML ~~LOC~~ SOLN
26.0000 [IU] | Freq: Every day | SUBCUTANEOUS | Status: DC
  Administered 2019-08-07 – 2019-08-08 (×2): 26 [IU] via SUBCUTANEOUS
  Filled 2019-08-07 (×3): qty 0.26

## 2019-08-07 MED ORDER — FUROSEMIDE 10 MG/ML IJ SOLN
40.0000 mg | Freq: Once | INTRAMUSCULAR | Status: AC
  Administered 2019-08-07: 11:00:00 40 mg via INTRAVENOUS
  Filled 2019-08-07: qty 4

## 2019-08-07 MED ORDER — HALOPERIDOL LACTATE 5 MG/ML IJ SOLN
5.0000 mg | INTRAMUSCULAR | Status: DC | PRN
  Administered 2019-08-07 – 2019-08-09 (×4): 5 mg via INTRAMUSCULAR
  Filled 2019-08-07 (×4): qty 1

## 2019-08-07 MED ORDER — IPRATROPIUM-ALBUTEROL 20-100 MCG/ACT IN AERS
1.0000 | INHALATION_SPRAY | Freq: Three times a day (TID) | RESPIRATORY_TRACT | Status: DC
  Administered 2019-08-07 – 2019-08-17 (×22): 1 via RESPIRATORY_TRACT

## 2019-08-07 MED ORDER — HALOPERIDOL LACTATE 5 MG/ML IJ SOLN
2.0000 mg | INTRAMUSCULAR | Status: DC | PRN
  Filled 2019-08-07 (×2): qty 1

## 2019-08-07 NOTE — Progress Notes (Signed)
Lower extremity venous has been completed.   Preliminary results in CV Proc.   Abram Sander 08/07/2019 1:59 PM

## 2019-08-07 NOTE — Progress Notes (Addendum)
PROGRESS NOTE    Lucas Hobbs  GHW:[id number] DOB: December 26, 1955 DOA: 08/03/2019 PCP: Jiles Garter, MD   Brief Narrative:  HPI per Dr. Jimmy Picket Lucas Hobbs is a 64 y.o. male with history of diabetes mellitus type 2, chronic kidney disease stage IV, hypertension, CAD status post CABG in 2019, hypertension presented to the ER because of persistent nausea vomiting and diarrhea with the last 1 week.  Patient states he has been having persistent vomiting with no blood in it and occasional diarrhea.  Denied any abdominal pain.  Had been a constant hiccups.  He still takes insulin.  When EMS was called in CBG was around 45.  Was given D10 and Zofran.  Blood sugar improved to 114.  ED Course: In the ER patient blood sugar again dropped around 60s and lab work show creatinine of 2.9 AST 78 platelets 132 EKG normal sinus rhythm chest x-ray unremarkable.  Covid test pending.  On exam abdomen appears benign.  Patient was started on D5 normal saline and admitted for hypoglycemia and persistent nausea vomiting.  With hiccups.  Assessment & Plan:   Principal Problem:   Hypoglycemia Active Problems:   CKD (chronic kidney disease), stage IV (HCC)   Nausea & vomiting   Essential hypertension   DM (diabetes mellitus), type 2 with renal complications (HCC)   CAD (coronary artery disease)   COVID-19  1 COVID-19 infection Patient had presented with nausea vomiting and diarrhea and noted to be hypoglycemic on presentation.  CT abdomen and pelvis which was done showed possible infiltrates in the lung concerning for Covid pneumonia.  COVID-19 PCR positive.  Inflammatory markers are ferritin, CRP, D-dimer noted to be elevated.  CRP and D-dimer trending down.  Ferritin fluctuating.  Patient noted not to be hypoxic with increased O2 requirements currently on 45 L nasal cannula with sats of 93%.  Check a BNP, check a chest x-ray.  Continue IV remdesivir and Decadron.  Patient was started empirically on IV Unasyn due  to concerns for aspiration.  Transitioned from IV Unasyn to Augmentin to complete a 5 to 7-day course of antibiotic treatment.  Continue Combivent inhaler, zinc, vitamin C. we will give a dose of Lasix 40 mg IV x1 and follow.  Check lower extremity Dopplers.  2.  Intractable nausea vomiting diarrhea Likely secondary to problem #1.  CT abdomen and pelvis with no acute abnormalities to explain nausea and vomiting.  Improved clinically with treatment of #1.  Patient's diet has been advanced and currently tolerating a carb modified diet.  Supportive care.  3.  Hypertension Continue Norvasc and Toprol-XL.  ACE inhibitor on hold secondary to renal function.  Patient will be given a dose of Lasix 40 mg IV x1.  4.  Coronary artery disease status post CABG/elevated troponin Currently asymptomatic.  Troponin is elevated however seems to have plateaued.  2D echo obtained with EF of 55 to 60%, no wall motion abnormalities.  Elevated troponin likely demand ischemia secondary to problem #1.  Continue home regimen of Toprol-XL, Norvasc, statin.  5.  Diabetes mellitus type 2 Hemoglobin A1c 7.5.  Patient noted to be hypoglycemic on presentation which was felt secondary to poor oral intake in the setting of ongoing insulin.  Patient initially was placed on D5 however due to elevated blood glucose levels D5 has been discontinued patient on normal saline.  Patient also on steroids and as such CBGs elevated.  Blood glucose level of 200 this morning.  Increase Lantus to 26 units daily.  Continue sliding scale insulin, Tradjenta 5 mg daily.   6.  Chronic kidney disease stage IV Renal function currently stable around baseline.  Saline lock IV fluids.  7.?  Confusion/acute metabolic encephalopathy Per RN patient with agitation and confusion overnight requiring Haldol.  Patient noted to be confused this morning and pulling out IVs.  Likely secondary to COVID-19 infection.  Ammonia levels within normal limits.  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  No acute abnormality noted.  Treat COVID-19 infection.  Place on Risperdal 0.5 mg twice daily.   .   DVT prophylaxis: Heparin Code Status: Full Family Communication: Updated patient. Tried calling wife however went to voicemail.  Disposition Plan:  . Patient came from: Home            . Anticipated d/c place: Home. . Barriers to d/c OR conditions which need to be met to effect a safe d/c: Likely home when clinically improved, completion of IV remdesivir, improvement with hypoxia.   Consultants:   None  Procedures:   CT renal stone protocol 08/04/2019  Chest x-ray 08/03/2019  Abdominal ultrasound 08/04/2019  2D echo 08/04/2019  Chest x-ray pending  CT head 08/06/2019  Antimicrobials:  IV Unasyn 08/04/2019>>>> 08/06/2019  Augmentin 08/06/2019   Subjective: Per RN patient noted to be agitated overnight and received some Haldol.  Patient noted to be pulling out IVs this morning.  Patient sitting up in chair.  Patient denies any chest pain.  Patient denies any significant shortness of breath.  No nausea or vomiting.  Patient denies any diarrhea.  Patient tolerating oral intake.  Patient noted to be more hypoxic with increased O2 requirements and currently with 86% sats on room air, now on 4-5 L nasal cannula with sats of 93%.   Objective: Vitals:   08/07/19 0913 08/07/19 0947 08/07/19 1053 08/07/19 1054  BP:      Pulse: 65 65    Resp: (!) 23     Temp:      TempSrc:      SpO2: 92% 90% 90% 94%  Weight:      Height:        Intake/Output Summary (Last 24 hours) at 08/07/2019 1059 Last data filed at 08/07/2019 0859 Gross per 24 hour  Intake 440 ml  Output 1 ml  Net 439 ml   Filed Weights   08/04/19 0538 08/07/19 0905  Weight: 85 kg 73.8 kg    Examination:  General exam: NAD  Respiratory system: Scattered coarse breath sounds/crackles noted.  Some decreased breath sounds  in the bases.  No wheezing.  Speaking in full sentences.  No use of accessory muscles of respiration.   Cardiovascular system: RRR no murmurs rubs or gallops.  No JVD.  No lower extremity edema.  Gastrointestinal system: Abdomen is nontender, nondistended, soft, positive bowel sounds.  No rebound.  No guarding.  Central nervous system: Alert and oriented. No focal neurological deficits. Extremities: Symmetric 5 x 5 power. Skin: No rashes, lesions or ulcers Psychiatry: Judgement and insight appear normal. Mood & affect appropriate.     Data Reviewed: I have personally reviewed following labs and imaging studies  CBC: Recent Labs  Lab 08/04/19 0456 08/04/19 1102 08/05/19 0302 08/06/19 0321 08/07/19 0809  WBC 5.3 4.6 3.7* 8.9 9.9  NEUTROABS 4.0  --  3.0 7.5 8.4*  HGB 13.3 14.0 13.1 14.6 12.8*  HCT 40.1 43.1 41.9 46.5 38.5*  MCV 87.9 88.9  89.9 90.3 86.7  PLT 119* 121* 119* 157 509   Basic Metabolic Panel: Recent Labs  Lab 08/03/19 2321 08/03/19 2321 08/04/19 0456 08/04/19 1102 08/05/19 0302 08/06/19 0321 08/07/19 0809  NA 139  --  139  --  140 144 146*  K 3.9  --  3.6  --  4.1 4.2 4.2  CL 101  --  104  --  107 109 114*  CO2 26  --  25  --  24 19* 24  GLUCOSE 140*  --  166*  --  347* 318* 203*  BUN 72*  --  70*  --  77* 73* 79*  CREATININE 2.99*   < > 3.01* 2.74* 2.78* 2.53* 2.22*  CALCIUM 8.1*  --  7.8*  --  7.8* 8.1* 8.0*  MG  --   --   --   --  2.5* 2.7* 2.8*  PHOS  --   --   --   --  3.8 3.7 3.7   < > = values in this interval not displayed.   GFR: Estimated Creatinine Clearance: 31.8 mL/min (A) (by C-G formula based on SCr of 2.22 mg/dL (H)). Liver Function Tests: Recent Labs  Lab 08/03/19 2321 08/04/19 0456 08/05/19 0302 08/06/19 0321 08/07/19 0809  AST 78* 68* 57* 60* 89*  ALT 37 34 36 39 46*  ALKPHOS 46 43 42 46 48  BILITOT 1.1 1.2 1.2 0.7 1.0  PROT 6.2* 5.4* 5.3* 5.8* 5.2*  ALBUMIN 3.1* 2.7* 2.6* 2.8* 2.7*   No results for input(s): LIPASE,  AMYLASE in the last 168 hours. Recent Labs  Lab 08/06/19 1722  AMMONIA 14   Coagulation Profile: No results for input(s): INR, PROTIME in the last 168 hours. Cardiac Enzymes: No results for input(s): CKTOTAL, CKMB, CKMBINDEX, TROPONINI in the last 168 hours. BNP (last 3 results) No results for input(s): PROBNP in the last 8760 hours. HbA1C: Recent Labs    08/05/19 0302  HGBA1C 7.5*   CBG: Recent Labs  Lab 08/06/19 1721 08/06/19 2015 08/06/19 2303 08/07/19 0503 08/07/19 0714  GLUCAP 296* 294* 263* 211* 200*   Lipid Profile: No results for input(s): CHOL, HDL, LDLCALC, TRIG, CHOLHDL, LDLDIRECT in the last 72 hours. Thyroid Function Tests: No results for input(s): TSH, T4TOTAL, FREET4, T3FREE, THYROIDAB in the last 72 hours. Anemia Panel: Recent Labs    08/06/19 0321 08/07/19 0809  FERRITIN 1,366* 1,139*   Sepsis Labs: Recent Labs  Lab 08/04/19 0807 08/04/19 1102  PROCALCITON 0.21 0.17    Recent Results (from the past 240 hour(s))  SARS CORONAVIRUS 2 (TAT 6-24 HRS) Nasopharyngeal Nasopharyngeal Swab     Status: Abnormal   Collection Time: 08/04/19 12:53 AM   Specimen: Nasopharyngeal Swab  Result Value Ref Range Status   SARS Coronavirus 2 POSITIVE (A) NEGATIVE Final    Comment: RESULT CALLED TO, READ BACK BY AND VERIFIED WITH: Ivin Poot [id number] 08/04/2019 T. TYSOR (NOTE) SARS-CoV-2 target nucleic acids are DETECTED. The SARS-CoV-2 RNA is generally detectable in upper and lower respiratory specimens during the acute phase of infection. Positive results are indicative of the presence of SARS-CoV-2 RNA. Clinical correlation with patient history and other diagnostic information is  necessary to determine patient infection status. Positive results do not rule out bacterial infection or co-infection with other viruses.  The expected result is Negative. Fact Sheet for Patients: SugarRoll.be Fact Sheet for Healthcare  Providers: https://www.woods-mathews.com/ This test is not yet approved or cleared by the [place] FDA and  has been  authorized for detection and/or diagnosis of SARS-CoV-2 by FDA under an Emergency Use Authorization (EUA). This EUA will remain  in effect (meaning this test can be used) for th e duration of the COVID-19 declaration under Section 564(b)(1) of the Act, 21 U.S.C. section 360bbb-3(b)(1), unless the authorization is terminated or revoked sooner. Performed at [facility], [place] [address], [place], [place] [address]   Respiratory Panel by PCR     Status: None   Collection Time: 08/04/19 12:53 AM   Specimen: Nasopharyngeal Swab; Respiratory  Result Value Ref Range Status   Adenovirus NOT DETECTED NOT DETECTED Final   Coronavirus 229E NOT DETECTED NOT DETECTED Final    Comment: (NOTE) The Coronavirus on the Respiratory Panel, DOES NOT test for the novel  Coronavirus (2019 nCoV)    Coronavirus HKU1 NOT DETECTED NOT DETECTED Final   Coronavirus NL63 NOT DETECTED NOT DETECTED Final   Coronavirus OC43 NOT DETECTED NOT DETECTED Final   Metapneumovirus NOT DETECTED NOT DETECTED Final   Rhinovirus / Enterovirus NOT DETECTED NOT DETECTED Final   Influenza A NOT DETECTED NOT DETECTED Final   Influenza B NOT DETECTED NOT DETECTED Final   Parainfluenza Virus 1 NOT DETECTED NOT DETECTED Final   Parainfluenza Virus 2 NOT DETECTED NOT DETECTED Final   Parainfluenza Virus 3 NOT DETECTED NOT DETECTED Final   Parainfluenza Virus 4 NOT DETECTED NOT DETECTED Final   Respiratory Syncytial Virus NOT DETECTED NOT DETECTED Final   Bordetella pertussis NOT DETECTED NOT DETECTED Final   Chlamydophila pneumoniae NOT DETECTED NOT DETECTED Final   Mycoplasma pneumoniae NOT DETECTED NOT DETECTED Final    Comment: Performed at [facility], [place]. [address] [place], [place] [address]  Culture, Urine     Status: None   Collection Time: 08/04/19  3:08 PM   Specimen: Urine,  Random  Result Value Ref Range Status   Specimen Description   Final    URINE, RANDOM Performed at [facility] [address], [place], [place] [address]    Special Requests   Final    NONE Performed at [facility], [place] [address] [place], [place] [address]    Culture   Final    NO GROWTH Performed at [facility], [place] [address] [place], [place] [address]    Report Status 08/05/2019 FINAL  Final         Radiology Studies: CT HEAD WO CONTRAST  Result Date: 08/06/2019 CLINICAL DATA:  Delirium EXAM: CT HEAD WITHOUT CONTRAST TECHNIQUE: Contiguous axial images were obtained from the base of the skull through the vertex without intravenous contrast. COMPARISON:  None. FINDINGS: Brain: No acute hemorrhage, acute infarction or space-occupying mass lesion is seen. Multifocal infarcts are noted on the right with encephalomalacia predominately within the distribution of the right middle cerebral artery. Vascular: No hyperdense vessel or unexpected calcification. Skull: Normal. Negative for fracture or focal lesion. Sinuses/Orbits: Mucosal retention cyst is noted in the left maxillary antrum. 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 No acute abnormality noted. Electronically Signed   By: Inez Catalina M.D.   On: 08/06/2019 20:16   DG CHEST PORT 1 VIEW  Result Date: 08/07/2019 CLINICAL DATA:  Hypoxia EXAM: PORTABLE CHEST 1 VIEW COMPARISON:  August 04, 2019 FINDINGS: There is new airspace consolidation in the left mid and lower lung zones as well as in the right upper lobe and right base. Heart is  upper normal in size with pulmonary vascularity normal. Patient is status post coronary artery bypass grafting. No adenopathy. No bone lesions. IMPRESSION: Multifocal airspace opacity, new from prior study. Appearance consistent with multifocal pneumonia. Stable cardiac silhouette.  Electronically Signed   By: Lowella Grip III M.D.   On: 08/07/2019 08:40        Scheduled Meds: . amLODipine  10 mg Oral Daily  . amoxicillin-clavulanate  1 tablet Oral Q12H  . vitamin C  500 mg Oral Daily  . aspirin  81 mg Oral Daily  . atorvastatin  40 mg Oral QPM  . dexamethasone (DECADRON) injection  6 mg Intravenous Daily  . famotidine  20 mg Oral Daily  . heparin  5,000 Units Subcutaneous Q8H  . insulin aspart  0-5 Units Subcutaneous QHS  . insulin aspart  0-6 Units Subcutaneous TID WC  . insulin glargine  26 Units Subcutaneous Daily  . Ipratropium-Albuterol  1 puff Inhalation Q6H  . linagliptin  5 mg Oral Daily  . metoprolol succinate  100 mg Oral QPM  . risperiDONE  0.5 mg Oral BID  . zinc sulfate  220 mg Oral Daily   Continuous Infusions: . remdesivir 100 mg in NS 100 mL 100 mg (08/07/19 1041)     LOS: 3 days    Time spent: 40 minutes    Irine Seal, MD [facility]   To contact the attending provider between 7A-7P or the covering provider during after hours 7P-7A, please log into the web site [email or web address] and access using universal [place] password for that web site. If you do not have the password, please call the hospital operator.  08/07/2019, 10:59 AM

## 2019-08-07 NOTE — Progress Notes (Signed)
Patient is becoming increasingly agitated and combative, repeatedly attempting to get OOB and begging to "get out of the car." Pt very confused, is redirectable for only a minute before attempting to get OOB again. Pt very strong and fast, also extremely unsteady. IV Ativan given at 1538 and was minimally effective. IM Haldol given at 1825. Sitter at bedside. Will continue to monitor closely.

## 2019-08-07 NOTE — Progress Notes (Signed)
Patient's O2 Sats 82-86% RA. 2L/[place] applied with Sats only increased to 87%. O2 Sats currently 89-93% 4L/[place]. Pt does not appear volume overloaded or in distress. Pt does not c/o SOB, states he feels "blah." Pt cooperative at this time, sitting up in bed. C/o being sleepy but is alert. Very shallow breathing, difficult to get patient to take a deep breath. IS encouraged, but pt would only bite mouthpiece. Did not seem to grasp the concept of inhaling like sucking through a straw. Pt did cough several large coughs with encouragement. MD [place] Silos aware and CXR ordered. New PIV started after patient pulled out his 5th one on previous shift. PIV wrapped with kerlix. Will continue to monitor closely.

## 2019-08-07 NOTE — Progress Notes (Signed)
Pt has progressively became more agitated and aggressive when being redirected by RN and NT. Sitter in room report patient is unable to be redirected and reoriented, pulled out IV that was started this shift and repeatedly tried to leave the room. On-call provider paged, see new orders.

## 2019-08-07 NOTE — Progress Notes (Signed)
Pt is becoming very anxious,combative,agitated ,constantly trying to get out from bed. On call notified. Received new order. Administered Ativan 2mg  IV push and applied soft wrist restraints at 2000. Sitter at bedside. Will continue to monitor.

## 2019-08-07 NOTE — Progress Notes (Signed)
Did not initiate restraint this shift. Sitter in room, will continue to monitor.

## 2019-08-08 DIAGNOSIS — J1282 Pneumonia due to coronavirus disease 2019: Secondary | ICD-10-CM

## 2019-08-08 LAB — CBC WITH DIFFERENTIAL/PLATELET
Abs Immature Granulocytes: 0.21 10*3/uL — ABNORMAL HIGH (ref 0.00–0.07)
Basophils Absolute: 0 10*3/uL (ref 0.0–0.1)
Basophils Relative: 0 %
Eosinophils Absolute: 0 10*3/uL (ref 0.0–0.5)
Eosinophils Relative: 0 %
HCT: 37.8 % — ABNORMAL LOW (ref 39.0–52.0)
Hemoglobin: 12.6 g/dL — ABNORMAL LOW (ref 13.0–17.0)
Immature Granulocytes: 2 %
Lymphocytes Relative: 4 %
Lymphs Abs: 0.4 10*3/uL — ABNORMAL LOW (ref 0.7–4.0)
MCH: 28.4 pg (ref 26.0–34.0)
MCHC: 33.3 g/dL (ref 30.0–36.0)
MCV: 85.3 fL (ref 80.0–100.0)
Monocytes Absolute: 0.8 10*3/uL (ref 0.1–1.0)
Monocytes Relative: 8 %
Neutro Abs: 8.7 10*3/uL — ABNORMAL HIGH (ref 1.7–7.7)
Neutrophils Relative %: 86 %
Platelets: 159 10*3/uL (ref 150–400)
RBC: 4.43 MIL/uL (ref 4.22–5.81)
RDW: 13.1 % (ref 11.5–15.5)
WBC: 10.1 10*3/uL (ref 4.0–10.5)
nRBC: 0 % (ref 0.0–0.2)

## 2019-08-08 LAB — MAGNESIUM: Magnesium: 2.6 mg/dL — ABNORMAL HIGH (ref 1.7–2.4)

## 2019-08-08 LAB — COMPREHENSIVE METABOLIC PANEL
ALT: 74 U/L — ABNORMAL HIGH (ref 0–44)
AST: 168 U/L — ABNORMAL HIGH (ref 15–41)
Albumin: 3 g/dL — ABNORMAL LOW (ref 3.5–5.0)
Alkaline Phosphatase: 60 U/L (ref 38–126)
Anion gap: 12 (ref 5–15)
BUN: 71 mg/dL — ABNORMAL HIGH (ref 8–23)
CO2: 23 mmol/L (ref 22–32)
Calcium: 8.4 mg/dL — ABNORMAL LOW (ref 8.9–10.3)
Chloride: 111 mmol/L (ref 98–111)
Creatinine, Ser: 2.23 mg/dL — ABNORMAL HIGH (ref 0.61–1.24)
GFR calc Af Amer: 35 mL/min — ABNORMAL LOW (ref >60)
GFR calc non Af Amer: 30 mL/min — ABNORMAL LOW (ref >60)
Glucose, Bld: 148 mg/dL — ABNORMAL HIGH (ref 70–99)
Potassium: 3.6 mmol/L (ref 3.5–5.1)
Sodium: 146 mmol/L — ABNORMAL HIGH (ref 135–145)
Total Bilirubin: 1.1 mg/dL (ref 0.3–1.2)
Total Protein: 5.3 g/dL — ABNORMAL LOW (ref 6.5–8.1)

## 2019-08-08 LAB — C-REACTIVE PROTEIN: CRP: 1.8 mg/dL — ABNORMAL HIGH (ref <1.0)

## 2019-08-08 LAB — GLUCOSE, CAPILLARY
Glucose-Capillary: 141 mg/dL — ABNORMAL HIGH (ref 70–99)
Glucose-Capillary: 211 mg/dL — ABNORMAL HIGH (ref 70–99)
Glucose-Capillary: 193 mg/dL — ABNORMAL HIGH (ref 70–99)
Glucose-Capillary: 211 mg/dL — ABNORMAL HIGH (ref 70–99)

## 2019-08-08 LAB — PHOSPHORUS: Phosphorus: 3.6 mg/dL (ref 2.5–4.6)

## 2019-08-08 LAB — PROCALCITONIN: Procalcitonin: <0.1 ng/mL

## 2019-08-08 LAB — FERRITIN: Ferritin: 949 ng/mL — ABNORMAL HIGH (ref 24–336)

## 2019-08-08 LAB — D-DIMER, QUANTITATIVE: D-Dimer, Quant: 1.04 ug/mL-FEU — ABNORMAL HIGH (ref 0.00–0.50)

## 2019-08-08 MED ORDER — FUROSEMIDE 10 MG/ML IJ SOLN
40.0000 mg | Freq: Every day | INTRAMUSCULAR | Status: DC
  Administered 2019-08-08 – 2019-08-09 (×2): 40 mg via INTRAVENOUS
  Filled 2019-08-08 (×2): qty 4

## 2019-08-08 MED ORDER — LORAZEPAM 2 MG/ML IJ SOLN
1.0000 mg | Freq: Four times a day (QID) | INTRAMUSCULAR | Status: DC | PRN
  Administered 2019-08-08 – 2019-08-09 (×2): 1 mg via INTRAVENOUS
  Filled 2019-08-08 (×2): qty 1

## 2019-08-08 MED ORDER — RISPERIDONE 1 MG PO TBDP
1.0000 mg | ORAL_TABLET | Freq: Two times a day (BID) | ORAL | Status: DC
  Administered 2019-08-08 (×2): 1 mg via ORAL
  Filled 2019-08-08 (×4): qty 1

## 2019-08-08 NOTE — Progress Notes (Signed)
PROGRESS NOTE    Paolo Okane  JSE:[id number] DOB: 1955/12/14 DOA: 08/03/2019 PCP: Jiles Garter, MD   Brief Narrative:  HPI per Dr. Jimmy Picket Cobern is a 64 y.o. male with history of diabetes mellitus type 2, chronic kidney disease stage IV, hypertension, CAD status post CABG in 2019, hypertension presented to the ER because of persistent nausea vomiting and diarrhea with the last 1 week.  Patient states he has been having persistent vomiting with no blood in it and occasional diarrhea.  Denied any abdominal pain.  Had been a constant hiccups.  He still takes insulin.  When EMS was called in CBG was around 55.  Was given D10 and Zofran.  Blood sugar improved to 114.  ED Course: In the ER patient blood sugar again dropped around 60s and lab work show creatinine of 2.9 AST 78 platelets 132 EKG normal sinus rhythm chest x-ray unremarkable.  Covid test pending.  On exam abdomen appears benign.  Patient was started on D5 normal saline and admitted for hypoglycemia and persistent nausea vomiting.  With hiccups.  Assessment & Plan:   Principal Problem:   Hypoglycemia Active Problems:   CKD (chronic kidney disease), stage IV (HCC)   Nausea & vomiting   Essential hypertension   DM (diabetes mellitus), type 2 with renal complications (HCC)   CAD (coronary artery disease)   COVID-19   Pneumonia due to COVID-19 virus  1 COVID-19 infection/ COVID-19 positive test (U07.1, COVID-19) with Acute Pneumonia (J12.89, Other viral pneumonia) (If respiratory failure or sepsis present, add as separate assessment) Patient had presented with nausea vomiting and diarrhea and noted to be hypoglycemic on presentation.  CT abdomen and pelvis which was done showed possible infiltrates in the lung concerning for Covid pneumonia.  COVID-19 PCR positive.  Inflammatory markers are ferritin, CRP, D-dimer noted to be elevated.  CRP and D-dimer trending down.  Ferritin fluctuating.  Patient noted not to be hypoxic  with increased O2 requirements currently on 4 L nasal cannula with sats of 91%.  BNP noted to be 127.2 from 31.2 (08/04/19).chest x-ray consistent with multifocal airspace opacity new from prior study appearance consistent with multifocal pneumonia.  Patient received a dose of Lasix 40 mg IV x1(08/07/2019) with a urine output of 1.750 L over the past 24 hours.  Patient is +2 L during this hospitalization.  Patient also noted to have chronic kidney disease.  Continue IV remdesivir and Decadron.  Patient was started empirically on IV Unasyn due to concerns for aspiration.  Transitioned from IV Unasyn to Augmentin to complete a 5 to 7-day course of antibiotic treatment.  Procalcitonin noted to be negative.  Discontinue antibiotics.  Continue Combivent inhaler, zinc, vitamin C. placed on Lasix 40 mg IV daily x3 days.  Lower extremity Dopplers negative for DVT.    2.  Intractable nausea vomiting diarrhea Likely secondary to problem #1.  CT abdomen and pelvis with no acute abnormalities to explain nausea and vomiting.  Improved clinically with treatment of #1.  Patient's diet has been advanced and currently tolerating a carb modified diet.  Supportive care.  3.  Hypertension Continue Norvasc and Toprol-XL.  ACE inhibitor on hold secondary to renal function.  We will give another dose of Lasix 40 mg IV daily x3 days.   4.  Coronary artery disease status post CABG/elevated troponin Currently asymptomatic.  Troponin is elevated however seems to have plateaued.  2D echo obtained with EF of 55 to 60%, no wall motion abnormalities.  Elevated  troponin likely demand ischemia secondary to problem #1.  Continue home regimen of Toprol-XL, Norvasc, statin.  5.  Diabetes mellitus type 2/Hypoglycemia Hemoglobin A1c 7.5.  Patient noted to be hypoglycemic on presentation which was felt secondary to poor oral intake in the setting of ongoing insulin.  Patient initially was placed on D5 however due to elevated blood glucose levels  D5 has been discontinued patient on normal saline.  Patient also on steroids and as such CBGs elevated.  Blood glucose level of 141 this morning.  Continue Lantus 26 units daily.  Continue sliding scale insulin, Tradjenta 5 mg daily.   6.  Chronic kidney disease stage IV Renal function currently stable around baseline.  Monitor with diuresis.   7.?  Confusion/acute metabolic encephalopathy Likely multifactorial secondary to hypoxia, possible steroids, acute infection.  Per RN patient with agitation and confusion and combativeness requiring Ativan.  Patient is soft restraints this morning.  Ammonia levels within normal limits.  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  No abnormality noted on head CT.  Increase risperidone to 1 mg twice daily.  Place on Ativan as needed.    .   DVT prophylaxis: Heparin Code Status: Full Family Communication: Updated patient.  Disposition Plan:  . Patient came from: Home            . Anticipated d/c place: Home. . Barriers to d/c OR conditions which need to be met to effect a safe d/c: Likely home when clinically improved, completion of IV remdesivir, improvement with hypoxia and confusion.   Consultants:   None  Procedures:   CT renal stone protocol 08/04/2019  Chest x-ray 08/03/2019  Abdominal ultrasound 08/04/2019  2D echo 08/04/2019  Chest x-ray 08/07/2019  CT head 08/06/2019  Antimicrobials:  IV Unasyn 08/04/2019>>>> 08/06/2019  Augmentin 08/06/2019>>>>> 08/08/2019   Subjective: Patient confused this morning.  Patient in restraints.  Patient noted to be agitated overnight and this morning pulling out IV lines and trying to get out of bed.  Noted to be combative and anxious.  Patient received 2 mg of IV Ativan overnight and soft restraints applied with bedside sitter.  Patient currently on 4 L nasal cannula with sats of 91%.     Objective: Vitals:   08/07/19 2358 08/08/19  0322 08/08/19 0358 08/08/19 1332  BP: (!) 169/85  122/61 134/61  Pulse: 90  (!) 58 86  Resp: 20  20   Temp: 98.1 F (36.7 C)  (!) 97.4 F (36.3 C) 99 F (37.2 C)  TempSrc: Axillary  Oral Axillary  SpO2: 91%  100% 96%  Weight:  75.4 kg    Height:        Intake/Output Summary (Last 24 hours) at 08/08/2019 1625 Last data filed at 08/08/2019 1622 Gross per 24 hour  Intake 250 ml  Output 800 ml  Net -550 ml   Filed Weights   08/04/19 0538 08/07/19 0905 08/08/19 0322  Weight: 85 kg 73.8 kg 75.4 kg    Examination:  General exam: Confused. Respiratory system: Scattered coarse breath sounds/crackles noted.  Decreased breath sounds in the bases.  No wheezing.  No use of accessory muscles of respiration.    Cardiovascular system: Regular rate rhythm no murmurs rubs or gallops.  No JVD.  No lower extremity edema. Gastrointestinal system: Abdomen is soft, nontender, nondistended, positive bowel sounds.  No rebound.  No guarding.  Central nervous system: Confused. No focal neurological deficits. Extremities: Symmetric  5 x 5 power. Skin: No rashes, lesions or ulcers Psychiatry: Judgement and insight appear poor. Mood & affect appropriate.     Data Reviewed: I have personally reviewed following labs and imaging studies  CBC: Recent Labs  Lab 08/04/19 0456 08/04/19 0456 08/04/19 1102 08/05/19 0302 08/06/19 0321 08/07/19 0809 08/08/19 0346  WBC 5.3   < > 4.6 3.7* 8.9 9.9 10.1  NEUTROABS 4.0  --   --  3.0 7.5 8.4* 8.7*  HGB 13.3   < > 14.0 13.1 14.6 12.8* 12.6*  HCT 40.1   < > 43.1 41.9 46.5 38.5* 37.8*  MCV 87.9   < > 88.9 89.9 90.3 86.7 85.3  PLT 119*   < > 121* 119* 157 157 159   < > = values in this interval not displayed.   Basic Metabolic Panel: Recent Labs  Lab 08/04/19 0456 08/04/19 0456 08/04/19 1102 08/05/19 0302 08/06/19 0321 08/07/19 0809 08/08/19 0346  NA 139  --   --  140 144 146* 146*  K 3.6  --   --  4.1 4.2 4.2 3.6  CL 104  --   --  107 109 114* 111    CO2 25  --   --  24 19* 24 23  GLUCOSE 166*  --   --  347* 318* 203* 148*  BUN 70*  --   --  77* 73* 79* 71*  CREATININE 3.01*   < > 2.74* 2.78* 2.53* 2.22* 2.23*  CALCIUM 7.8*  --   --  7.8* 8.1* 8.0* 8.4*  MG  --   --   --  2.5* 2.7* 2.8* 2.6*  PHOS  --   --   --  3.8 3.7 3.7 3.6   < > = values in this interval not displayed.   GFR: Estimated Creatinine Clearance: 31.7 mL/min (A) (by C-G formula based on SCr of 2.23 mg/dL (H)). Liver Function Tests: Recent Labs  Lab 08/04/19 0456 08/05/19 0302 08/06/19 0321 08/07/19 0809 08/08/19 0346  AST 68* 57* 60* 89* 168*  ALT 34 36 39 46* 74*  ALKPHOS 43 42 46 48 60  BILITOT 1.2 1.2 0.7 1.0 1.1  PROT 5.4* 5.3* 5.8* 5.2* 5.3*  ALBUMIN 2.7* 2.6* 2.8* 2.7* 3.0*   No results for input(s): LIPASE, AMYLASE in the last 168 hours. Recent Labs  Lab 08/06/19 1722  AMMONIA 14   Coagulation Profile: No results for input(s): INR, PROTIME in the last 168 hours. Cardiac Enzymes: No results for input(s): CKTOTAL, CKMB, CKMBINDEX, TROPONINI in the last 168 hours. BNP (last 3 results) No results for input(s): PROBNP in the last 8760 hours. HbA1C: No results for input(s): HGBA1C in the last 72 hours. CBG: Recent Labs  Lab 08/07/19 1616 08/07/19 2209 08/08/19 0809 08/08/19 1213 08/08/19 1623  GLUCAP 294* 149* 141* 211* 193*   Lipid Profile: No results for input(s): CHOL, HDL, LDLCALC, TRIG, CHOLHDL, LDLDIRECT in the last 72 hours. Thyroid Function Tests: No results for input(s): TSH, T4TOTAL, FREET4, T3FREE, THYROIDAB in the last 72 hours. Anemia Panel: Recent Labs    08/07/19 0809 08/08/19 0346  FERRITIN 1,139* 949*   Sepsis Labs: Recent Labs  Lab 08/04/19 0807 08/04/19 1102 08/07/19 0809 08/08/19 0346  PROCALCITON 0.21 0.17 <0.10 <0.10    Recent Results (from the past 240 hour(s))  SARS CORONAVIRUS 2 (TAT 6-24 HRS) Nasopharyngeal Nasopharyngeal Swab     Status: Abnormal   Collection Time: 08/04/19 12:53 AM   Specimen:  Nasopharyngeal Swab  Result Value  Ref Range Status   SARS Coronavirus 2 POSITIVE (A) NEGATIVE Final    Comment: RESULT CALLED TO, READ BACK BY AND VERIFIED WITH: J. Erskine Emery [id number] 08/04/2019 T. TYSOR (NOTE) SARS-CoV-2 target nucleic acids are DETECTED. The SARS-CoV-2 RNA is generally detectable in upper and lower respiratory specimens during the acute phase of infection. Positive results are indicative of the presence of SARS-CoV-2 RNA. Clinical correlation with patient history and other diagnostic information is  necessary to determine patient infection status. Positive results do not rule out bacterial infection or co-infection with other viruses.  The expected result is Negative. Fact Sheet for Patients: SugarRoll.be Fact Sheet for Healthcare Providers: https://www.woods-mathews.com/ This test is not yet approved or cleared by the [place] FDA and  has been authorized for detection and/or diagnosis of SARS-CoV-2 by FDA under an Emergency Use Authorization (EUA). This EUA will remain  in effect (meaning this test can be used) for th e duration of the COVID-19 declaration under Section 564(b)(1) of the Act, 21 U.S.C. section 360bbb-3(b)(1), unless the authorization is terminated or revoked sooner. Performed at [facility], [place] [address] [place], [place] [address]   Respiratory Panel by PCR     Status: None   Collection Time: 08/04/19 12:53 AM   Specimen: Nasopharyngeal Swab; Respiratory  Result Value Ref Range Status   Adenovirus NOT DETECTED NOT DETECTED Final   Coronavirus 229E NOT DETECTED NOT DETECTED Final    Comment: (NOTE) The Coronavirus on the Respiratory Panel, DOES NOT test for the novel  Coronavirus (2019 nCoV)    Coronavirus HKU1 NOT DETECTED NOT DETECTED Final   Coronavirus NL63 NOT DETECTED NOT DETECTED Final   Coronavirus OC43 NOT DETECTED NOT DETECTED Final   Metapneumovirus NOT DETECTED NOT DETECTED Final    Rhinovirus / Enterovirus NOT DETECTED NOT DETECTED Final   Influenza A NOT DETECTED NOT DETECTED Final   Influenza B NOT DETECTED NOT DETECTED Final   Parainfluenza Virus 1 NOT DETECTED NOT DETECTED Final   Parainfluenza Virus 2 NOT DETECTED NOT DETECTED Final   Parainfluenza Virus 3 NOT DETECTED NOT DETECTED Final   Parainfluenza Virus 4 NOT DETECTED NOT DETECTED Final   Respiratory Syncytial Virus NOT DETECTED NOT DETECTED Final   Bordetella pertussis NOT DETECTED NOT DETECTED Final   Chlamydophila pneumoniae NOT DETECTED NOT DETECTED Final   Mycoplasma pneumoniae NOT DETECTED NOT DETECTED Final    Comment: Performed at [facility], [place]. [address] [place], [place] [address]  Culture, Urine     Status: None   Collection Time: 08/04/19  3:08 PM   Specimen: Urine, Random  Result Value Ref Range Status   Specimen Description   Final    URINE, RANDOM Performed at [facility] [address] [place], [place] [address]    Special Requests   Final    NONE Performed at [facility], [place] [address] [place], [place] [address]    Culture   Final    NO GROWTH Performed at [facility], [place] [address] [place], [place] [address]    Report Status 08/05/2019 FINAL  Final         Radiology Studies: CT HEAD WO CONTRAST  Result Date: 08/06/2019 CLINICAL DATA:  Delirium EXAM: CT HEAD WITHOUT CONTRAST TECHNIQUE: Contiguous axial images were obtained from the base of the skull through the vertex without intravenous contrast. COMPARISON:  None. FINDINGS: Brain: No acute hemorrhage, acute infarction or space-occupying mass lesion is seen. Multifocal infarcts  are noted on the right with encephalomalacia predominately within the distribution of the right middle cerebral artery. Vascular: No hyperdense vessel or unexpected calcification. Skull: Normal. Negative for fracture or focal lesion. Sinuses/Orbits: Mucosal retention cyst is noted in the  left maxillary antrum. 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 No acute abnormality noted. Electronically Signed   By: Inez Catalina M.D.   On: 08/06/2019 20:16   DG CHEST PORT 1 VIEW  Result Date: 08/07/2019 CLINICAL DATA:  Hypoxia EXAM: PORTABLE CHEST 1 VIEW COMPARISON:  August 04, 2019 FINDINGS: There is new airspace consolidation in the left mid and lower lung zones as well as in the right upper lobe and right base. Heart is upper normal in size with pulmonary vascularity normal. Patient is status post coronary artery bypass grafting. No adenopathy. No bone lesions. IMPRESSION: Multifocal airspace opacity, new from prior study. Appearance consistent with multifocal pneumonia. Stable cardiac silhouette. Electronically Signed   By: Lowella Grip III M.D.   On: 08/07/2019 08:40   VAS [place] LOWER EXTREMITY VENOUS (DVT)  Result Date: 08/08/2019  Lower Venous DVTStudy Indications: Hypoxia, covid+.  Comparison Study: no prior Performing Technologist: Abram Sander RVS  Examination Guidelines: A complete evaluation includes B-mode imaging, spectral Doppler, color Doppler, and power Doppler as needed of all accessible portions of each vessel. Bilateral testing is considered an integral part of a complete examination. Limited examinations for reoccurring indications may be performed as noted. The reflux portion of the exam is performed with the patient in reverse Trendelenburg.  +---------+---------------+---------+-----------+----------+--------------+ RIGHT    CompressibilityPhasicitySpontaneityPropertiesThrombus Aging +---------+---------------+---------+-----------+----------+--------------+ CFV      Full           Yes      Yes                                 +---------+---------------+---------+-----------+----------+--------------+ SFJ      Full                                                         +---------+---------------+---------+-----------+----------+--------------+ FV Prox  Full                                                        +---------+---------------+---------+-----------+----------+--------------+ FV Mid   Full                                                        +---------+---------------+---------+-----------+----------+--------------+ FV DistalFull                                                        +---------+---------------+---------+-----------+----------+--------------+ PFV      Full                                                        +---------+---------------+---------+-----------+----------+--------------+  POP      Full           Yes      Yes                                 +---------+---------------+---------+-----------+----------+--------------+ PTV      Full                                                        +---------+---------------+---------+-----------+----------+--------------+ PERO     Full                                                        +---------+---------------+---------+-----------+----------+--------------+   +---------+---------------+---------+-----------+----------+--------------+ LEFT     CompressibilityPhasicitySpontaneityPropertiesThrombus Aging +---------+---------------+---------+-----------+----------+--------------+ CFV      Full           Yes      Yes                                 +---------+---------------+---------+-----------+----------+--------------+ SFJ      Full                                                        +---------+---------------+---------+-----------+----------+--------------+ FV Prox  Full                                                        +---------+---------------+---------+-----------+----------+--------------+ FV Mid   Full                                                         +---------+---------------+---------+-----------+----------+--------------+ FV DistalFull                                                        +---------+---------------+---------+-----------+----------+--------------+ PFV      Full                                                        +---------+---------------+---------+-----------+----------+--------------+ POP      Full           Yes      Yes                                 +---------+---------------+---------+-----------+----------+--------------+  PTV      Full                                                        +---------+---------------+---------+-----------+----------+--------------+ PERO     Full                                                        +---------+---------------+---------+-----------+----------+--------------+     Summary: BILATERAL: - No evidence of deep vein thrombosis seen in the lower extremities, bilaterally.   *See table(s) above for measurements and observations. Electronically signed by Curt Jews MD on 08/08/2019 at 8:17:03 AM.    Final         Scheduled Meds: . amLODipine  10 mg Oral Daily  . vitamin C  500 mg Oral Daily  . aspirin  81 mg Oral Daily  . atorvastatin  40 mg Oral QPM  . dexamethasone (DECADRON) injection  6 mg Intravenous Daily  . famotidine  20 mg Oral Daily  . furosemide  40 mg Intravenous Daily  . heparin  5,000 Units Subcutaneous Q8H  . insulin aspart  0-5 Units Subcutaneous QHS  . insulin aspart  0-6 Units Subcutaneous TID WC  . insulin glargine  26 Units Subcutaneous Daily  . Ipratropium-Albuterol  1 puff Inhalation TID  . linagliptin  5 mg Oral Daily  . metoprolol succinate  100 mg Oral QPM  . risperiDONE  1 mg Oral BID  . zinc sulfate  220 mg Oral Daily   Continuous Infusions:    LOS: 4 days    Time spent: 40 minutes    Irine Seal, MD [facility]   To contact the attending provider between 7A-7P or the covering provider  during after hours 7P-7A, please log into the web site [email or web address] and access using universal [place] password for that web site. If you do not have the password, please call the hospital operator.  08/08/2019, 4:25 PM

## 2019-08-09 ENCOUNTER — Inpatient Hospital Stay (HOSPITAL_COMMUNITY): Payer: 59

## 2019-08-09 LAB — COMPREHENSIVE METABOLIC PANEL
ALT: 114 U/L — ABNORMAL HIGH (ref 0–44)
AST: 209 U/L — ABNORMAL HIGH (ref 15–41)
Albumin: 3.3 g/dL — ABNORMAL LOW (ref 3.5–5.0)
Alkaline Phosphatase: 67 U/L (ref 38–126)
Anion gap: 11 (ref 5–15)
BUN: 78 mg/dL — ABNORMAL HIGH (ref 8–23)
CO2: 24 mmol/L (ref 22–32)
Calcium: 8.8 mg/dL — ABNORMAL LOW (ref 8.9–10.3)
Chloride: 111 mmol/L (ref 98–111)
Creatinine, Ser: 2.19 mg/dL — ABNORMAL HIGH (ref 0.61–1.24)
GFR calc Af Amer: 36 mL/min — ABNORMAL LOW (ref >60)
GFR calc non Af Amer: 31 mL/min — ABNORMAL LOW (ref >60)
Glucose, Bld: 256 mg/dL — ABNORMAL HIGH (ref 70–99)
Potassium: 4.3 mmol/L (ref 3.5–5.1)
Sodium: 146 mmol/L — ABNORMAL HIGH (ref 135–145)
Total Bilirubin: 1.3 mg/dL — ABNORMAL HIGH (ref 0.3–1.2)
Total Protein: 5.9 g/dL — ABNORMAL LOW (ref 6.5–8.1)

## 2019-08-09 LAB — CBC WITH DIFFERENTIAL/PLATELET
Abs Immature Granulocytes: 0.3 10*3/uL — ABNORMAL HIGH (ref 0.00–0.07)
Basophils Absolute: 0 10*3/uL (ref 0.0–0.1)
Basophils Relative: 0 %
Eosinophils Absolute: 0 10*3/uL (ref 0.0–0.5)
Eosinophils Relative: 0 %
HCT: 43.2 % (ref 39.0–52.0)
Hemoglobin: 14.4 g/dL (ref 13.0–17.0)
Immature Granulocytes: 3 %
Lymphocytes Relative: 5 %
Lymphs Abs: 0.5 10*3/uL — ABNORMAL LOW (ref 0.7–4.0)
MCH: 28.6 pg (ref 26.0–34.0)
MCHC: 33.3 g/dL (ref 30.0–36.0)
MCV: 85.7 fL (ref 80.0–100.0)
Monocytes Absolute: 1.1 10*3/uL — ABNORMAL HIGH (ref 0.1–1.0)
Monocytes Relative: 10 %
Neutro Abs: 8.9 10*3/uL — ABNORMAL HIGH (ref 1.7–7.7)
Neutrophils Relative %: 82 %
Platelets: 166 10*3/uL (ref 150–400)
RBC: 5.04 MIL/uL (ref 4.22–5.81)
RDW: 13.2 % (ref 11.5–15.5)
WBC: 10.9 10*3/uL — ABNORMAL HIGH (ref 4.0–10.5)
nRBC: 0 % (ref 0.0–0.2)

## 2019-08-09 LAB — MAGNESIUM: Magnesium: 3 mg/dL — ABNORMAL HIGH (ref 1.7–2.4)

## 2019-08-09 LAB — GLUCOSE, CAPILLARY
Glucose-Capillary: 240 mg/dL — ABNORMAL HIGH (ref 70–99)
Glucose-Capillary: 248 mg/dL — ABNORMAL HIGH (ref 70–99)
Glucose-Capillary: 244 mg/dL — ABNORMAL HIGH (ref 70–99)
Glucose-Capillary: 338 mg/dL — ABNORMAL HIGH (ref 70–99)

## 2019-08-09 LAB — PROCALCITONIN: Procalcitonin: 0.1 ng/mL

## 2019-08-09 LAB — FERRITIN: Ferritin: 901 ng/mL — ABNORMAL HIGH (ref 24–336)

## 2019-08-09 LAB — PHOSPHORUS: Phosphorus: 4.5 mg/dL (ref 2.5–4.6)

## 2019-08-09 LAB — AMMONIA: Ammonia: 25 umol/L (ref 9–35)

## 2019-08-09 LAB — D-DIMER, QUANTITATIVE: D-Dimer, Quant: 1.25 ug/mL-FEU — ABNORMAL HIGH (ref 0.00–0.50)

## 2019-08-09 LAB — URINE CULTURE: Culture: NO GROWTH

## 2019-08-09 LAB — C-REACTIVE PROTEIN: CRP: 1.4 mg/dL — ABNORMAL HIGH (ref <1.0)

## 2019-08-09 MED ORDER — LORAZEPAM 2 MG/ML IJ SOLN: 0.5000 mg | Freq: Four times a day (QID) | INTRAMUSCULAR | Status: DC | PRN

## 2019-08-09 MED ORDER — FAMOTIDINE IN NACL 20-0.9 MG/50ML-% IV SOLN
20.0000 mg | INTRAVENOUS | Status: DC
  Administered 2019-08-09 – 2019-08-15 (×7): 20 mg via INTRAVENOUS
  Filled 2019-08-09 (×7): qty 50

## 2019-08-09 MED ORDER — METOPROLOL TARTRATE 5 MG/5ML IV SOLN
5.0000 mg | Freq: Three times a day (TID) | INTRAVENOUS | Status: DC
  Filled 2019-08-09: qty 5

## 2019-08-09 MED ORDER — BISACODYL 10 MG RE SUPP
10.0000 mg | Freq: Once | RECTAL | Status: AC
  Administered 2019-08-09: 19:00:00 10 mg via RECTAL
  Filled 2019-08-09: qty 1

## 2019-08-09 MED ORDER — INSULIN GLARGINE 100 UNIT/ML ~~LOC~~ SOLN
30.0000 [IU] | Freq: Every day | SUBCUTANEOUS | Status: DC
  Administered 2019-08-09 – 2019-08-11 (×3): 30 [IU] via SUBCUTANEOUS
  Filled 2019-08-09 (×3): qty 0.3

## 2019-08-09 MED ORDER — LORAZEPAM 2 MG/ML IJ SOLN
1.0000 mg | Freq: Four times a day (QID) | INTRAMUSCULAR | Status: DC | PRN
  Administered 2019-08-11: 1 mg via INTRAVENOUS
  Filled 2019-08-09: qty 1

## 2019-08-09 NOTE — Progress Notes (Signed)
Patient remains in soft wrist restraints. Sitter reported that he attempted to kick her several times.  Skin assessed. Continues to refuse oral medications.   Several ECG changes reported.

## 2019-08-09 NOTE — Progress Notes (Signed)
Received report from nigh shift RN that patient had been given haloperidol IM at 06:05. Has soft wrist restraints on.  Upon assessment, patient did not verbally respond to any questions. Shook his head and refused to open his mouth for oral medications.

## 2019-08-09 NOTE — Progress Notes (Signed)
Pt agitated trying to climb out of bed, pulling off O2 and telemetry monitor Writer talked to pt and tried to reorient  Writer will medicate and continue to monitor

## 2019-08-09 NOTE — Progress Notes (Addendum)
PROGRESS NOTE    Lucas Hobbs  TLX:[id number] DOB: [birth year] DOA: 08/03/2019 PCP: Jiles Garter, MD   Brief Narrative:  HPI per Dr. Jimmy Picket Santagata is a 64 y.o. male with history of diabetes mellitus type 2, chronic kidney disease stage IV, hypertension, CAD status post CABG in 2019, hypertension presented to the ER because of persistent nausea vomiting and diarrhea with the last 1 week.  Patient states he has been having persistent vomiting with no blood in it and occasional diarrhea.  Denied any abdominal pain.  Had been a constant hiccups.  He still takes insulin.  When EMS was called in CBG was around 91.  Was given D10 and Zofran.  Blood sugar improved to 114.  ED Course: In the ER patient blood sugar again dropped around 60s and lab work show creatinine of 2.9 AST 78 platelets 132 EKG normal sinus rhythm chest x-ray unremarkable.  Covid test pending.  On exam abdomen appears benign.  Patient was started on D5 normal saline and admitted for hypoglycemia and persistent nausea vomiting.  With hiccups.  Assessment & Plan:   Principal Problem:   Hypoglycemia Active Problems:   CKD (chronic kidney disease), stage IV (HCC)   Nausea & vomiting   Essential hypertension   DM (diabetes mellitus), type 2 with renal complications (HCC)   CAD (coronary artery disease)   COVID-19   Pneumonia due to COVID-19 virus  1 COVID-19 infection/ COVID-19 positive test (U07.1, COVID-19) with Acute Pneumonia (J12.89, Other viral pneumonia) (If respiratory failure or sepsis present, add as separate assessment) Patient had presented with nausea vomiting and diarrhea and noted to be hypoglycemic on presentation.  CT abdomen and pelvis which was done showed possible infiltrates in the lung concerning for Covid pneumonia.  COVID-19 PCR positive.  Inflammatory markers are ferritin, CRP, D-dimer noted to be elevated.  CRP and D-dimer trending down.  Ferritin fluctuating.  Patient noted not to be hypoxic  with increased O2 requirements currently on 4 L nasal cannula with sats of 91-94 %.  BNP noted to be 127.2 from 31.2 (08/04/19).chest x-ray consistent with multifocal airspace opacity new from prior study appearance consistent with multifocal pneumonia.  Patient received a dose of Lasix 40 mg IV x1(08/07/2019) with a urine output of 1.975 L over the past 24 hours.  Patient is + 422 cc during this hospitalization.  Patient also noted to have chronic kidney disease.  Continue IV remdesivir and Decadron.  Patient was started empirically on IV Unasyn due to concerns for aspiration.  Transitioned from IV Unasyn to Augmentin to complete a 5 to 7-day course of antibiotic treatment.  Procalcitonin noted to be negative.  Discontinued antibiotics.  Continue Combivent inhaler, zinc, vitamin C. Continue Lasix 40 mg IV daily.  Lower extremity Dopplers negative for DVT.    2.  Intractable nausea vomiting diarrhea Likely secondary to problem #1.  CT abdomen and pelvis with no acute abnormalities to explain nausea and vomiting.  Improved clinically with treatment of #1.  Patient's diet has been advanced and was tolerating a carb modified diet.  Patient currently confused and not taking any oral intake today.  Patient currently confused with poor oral intake today.  Supportive care.  3.  Hypertension Continue Norvasc.  ACE inhibitor on hold secondary to renal function.  Continue Lasix 40 mg IV daily x2 days.  Discontinue beta-blocker due to bradycardia.  4.  Coronary artery disease status post CABG/elevated troponin Currently asymptomatic.  Troponin is elevated however seems to have  plateaued.  2D echo obtained with EF of 55 to 60%, no wall motion abnormalities.  Elevated troponin likely demand ischemia secondary to problem #1.  Continue home regimen of Norvasc, statin.  Discontinue beta-blocker due to bradycardia.   5.  Diabetes mellitus type 2/Hypoglycemia Hemoglobin A1c 7.5.  Patient noted to be hypoglycemic on  presentation which was felt secondary to poor oral intake in the setting of ongoing insulin.  Patient initially was placed on D5 however due to elevated blood glucose levels D5 has been discontinued patient on normal saline.  Patient also on steroids and as such CBGs elevated.  Blood glucose level of 240 this morning.  Increase Lantus to 30 units daily.  Continue Tradjenta.  Sliding scale insulin.   6.  Chronic kidney disease stage IV Renal function currently stable around baseline.  Monitor with diuresis.   7.?  Confusion/acute metabolic encephalopathy Likely multifactorial secondary to hypoxia, possible steroid induced, acute infection.  Per RN patient with agitation and confusion and combativeness requiring Ativan and Haldol.  Patient is soft restraints this morning.  Ammonia levels within normal limits.  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  No abnormality noted on head CT.  Discontinue Risperdal and Haldol due to QTC prolongation.  Continue Ativan as needed.  8.  QTC prolongation Magnesium level greater than 2.  Potassium at 4.3.  Repeat EKG.  Discontinue Haldol and Risperdal and Zofran.  Avoid QTC prolongation medications.  9.  Bradycardia Patient noted to have some bradycardic episodes in the 40s with some junctional rhythms noted per RN.  Discontinue patient's beta-blocker.  Follow.  .   DVT prophylaxis: Heparin Code Status: Full Family Communication: updated wife via telephone. Disposition Plan:  . Patient came from: Home            . Anticipated d/c place: Home. . Barriers to d/c OR conditions which need to be met to effect a safe d/c: Likely home when clinically improved, completion of IV remdesivir, improvement with hypoxia and confusion and mental status back to baseline.   Consultants:   None  Procedures:   CT renal stone protocol 08/04/2019  Chest x-ray 08/03/2019  Abdominal ultrasound  08/04/2019  2D echo 08/04/2019  Chest x-ray 08/07/2019  CT head 08/06/2019  Abdominal films pending 08/09/2019  Antimicrobials:  IV Unasyn 08/04/2019>>>> 08/06/2019  Augmentin 08/06/2019>>>>> 08/08/2019   Subjective: Patient sedated.  Patient noted to be combative and agitated earlier on received Ativan and Haldol overnight.  With mittens.     Objective: Vitals:   08/08/19 2024 08/09/19 0427 08/09/19 1012 08/09/19 1021  BP: (!) 184/119 (!) 151/99  (!) 192/81  Pulse: 93 70 70 (!) 53  Resp: 19 19    Temp: 97.9 F (36.6 C) 97.6 F (36.4 C)    TempSrc: Oral Oral    SpO2: 94% 91% 95%   Weight:      Height:        Intake/Output Summary (Last 24 hours) at 08/09/2019 1031 Last data filed at 08/09/2019 0825 Gross per 24 hour  Intake 100 ml  Output 1975 ml  Net -1875 ml   Filed Weights   08/04/19 0538 08/07/19 0905 08/08/19 0322  Weight: 85 kg 73.8 kg 75.4 kg    Examination:  General exam: Confused.  Sedated. Respiratory system: Decreased breath sounds in the bases.  Scattered coarse breath sounds/crackles in the bases noted.  No wheezing.  No use of accessory muscles of  respirations.  Cardiovascular system: RRR no murmurs rubs or gallops.  No JVD.  No lower extremity edema. Gastrointestinal system: Abdomen is soft, bilateral adb flank distension L > R.nontender, positive bowel sounds.  No rebound.  No guarding.  Central nervous system: Confused/sedated. No focal neurological deficits. Extremities: Symmetric 5 x 5 power. Skin: No rashes, lesions or ulcers Psychiatry: Judgement and insight appear poor. Mood & affect appropriate.     Data Reviewed: I have personally reviewed following labs and imaging studies  CBC: Recent Labs  Lab 08/05/19 0302 08/06/19 0321 08/07/19 0809 08/08/19 0346 08/09/19 0423  WBC 3.7* 8.9 9.9 10.1 10.9*  NEUTROABS 3.0 7.5 8.4* 8.7* 8.9*  HGB 13.1 14.6 12.8* 12.6* 14.4  HCT 41.9 46.5 38.5* 37.8* 43.2  MCV 89.9 90.3 86.7 85.3 85.7  PLT 119* 157 157  159 354   Basic Metabolic Panel: Recent Labs  Lab 08/05/19 0302 08/06/19 0321 08/07/19 0809 08/08/19 0346 08/09/19 0423  NA 140 144 146* 146* 146*  K 4.1 4.2 4.2 3.6 4.3  CL 107 109 114* 111 111  CO2 24 19* '24 23 24  ' GLUCOSE 347* 318* 203* 148* 256*  BUN 77* 73* 79* 71* 78*  CREATININE 2.78* 2.53* 2.22* 2.23* 2.19*  CALCIUM 7.8* 8.1* 8.0* 8.4* 8.8*  MG 2.5* 2.7* 2.8* 2.6* 3.0*  PHOS 3.8 3.7 3.7 3.6 4.5   GFR: Estimated Creatinine Clearance: 32.3 mL/min (A) (by C-G formula based on SCr of 2.19 mg/dL (H)). Liver Function Tests: Recent Labs  Lab 08/05/19 0302 08/06/19 0321 08/07/19 0809 08/08/19 0346 08/09/19 0423  AST 57* 60* 89* 168* 209*  ALT 36 39 46* 74* 114*  ALKPHOS 42 46 48 60 67  BILITOT 1.2 0.7 1.0 1.1 1.3*  PROT 5.3* 5.8* 5.2* 5.3* 5.9*  ALBUMIN 2.6* 2.8* 2.7* 3.0* 3.3*   No results for input(s): LIPASE, AMYLASE in the last 168 hours. Recent Labs  Lab 08/06/19 1722  AMMONIA 14   Coagulation Profile: No results for input(s): INR, PROTIME in the last 168 hours. Cardiac Enzymes: No results for input(s): CKTOTAL, CKMB, CKMBINDEX, TROPONINI in the last 168 hours. BNP (last 3 results) No results for input(s): PROBNP in the last 8760 hours. HbA1C: No results for input(s): HGBA1C in the last 72 hours. CBG: Recent Labs  Lab 08/08/19 0809 08/08/19 1213 08/08/19 1623 08/08/19 2026 08/09/19 0741  GLUCAP 141* 211* 193* 211* 240*   Lipid Profile: No results for input(s): CHOL, HDL, LDLCALC, TRIG, CHOLHDL, LDLDIRECT in the last 72 hours. Thyroid Function Tests: No results for input(s): TSH, T4TOTAL, FREET4, T3FREE, THYROIDAB in the last 72 hours. Anemia Panel: Recent Labs    08/08/19 0346 08/09/19 0423  FERRITIN 949* 901*   Sepsis Labs: Recent Labs  Lab 08/04/19 1102 08/07/19 0809 08/08/19 0346 08/09/19 0423  PROCALCITON 0.17 <0.10 <0.10 0.10    Recent Results (from the past 240 hour(s))  SARS CORONAVIRUS 2 (TAT 6-24 HRS) Nasopharyngeal  Nasopharyngeal Swab     Status: Abnormal   Collection Time: 08/04/19 12:53 AM   Specimen: Nasopharyngeal Swab  Result Value Ref Range Status   SARS Coronavirus 2 POSITIVE (A) NEGATIVE Final    Comment: RESULT CALLED TO, READ BACK BY AND VERIFIED WITH: Ivin Poot [id number] 08/04/2019 T. TYSOR (NOTE) SARS-CoV-2 target nucleic acids are DETECTED. The SARS-CoV-2 RNA is generally detectable in upper and lower respiratory specimens during the acute phase of infection. Positive results are indicative of the presence of SARS-CoV-2 RNA. Clinical correlation with patient history and other diagnostic information  is  necessary to determine patient infection status. Positive results do not rule out bacterial infection or co-infection with other viruses.  The expected result is Negative. Fact Sheet for Patients: SugarRoll.be Fact Sheet for Healthcare Providers: https://www.woods-mathews.com/ This test is not yet approved or cleared by the [place] FDA and  has been authorized for detection and/or diagnosis of SARS-CoV-2 by FDA under an Emergency Use Authorization (EUA). This EUA will remain  in effect (meaning this test can be used) for th e duration of the COVID-19 declaration under Section 564(b)(1) of the Act, 21 U.S.C. section 360bbb-3(b)(1), unless the authorization is terminated or revoked sooner. Performed at [facility], [place] [address] [place], [place] [address]   Respiratory Panel by PCR     Status: None   Collection Time: 08/04/19 12:53 AM   Specimen: Nasopharyngeal Swab; Respiratory  Result Value Ref Range Status   Adenovirus NOT DETECTED NOT DETECTED Final   Coronavirus 229E NOT DETECTED NOT DETECTED Final    Comment: (NOTE) The Coronavirus on the Respiratory Panel, DOES NOT test for the novel  Coronavirus (2019 nCoV)    Coronavirus HKU1 NOT DETECTED NOT DETECTED Final   Coronavirus NL63 NOT DETECTED NOT DETECTED Final    Coronavirus OC43 NOT DETECTED NOT DETECTED Final   Metapneumovirus NOT DETECTED NOT DETECTED Final   Rhinovirus / Enterovirus NOT DETECTED NOT DETECTED Final   Influenza A NOT DETECTED NOT DETECTED Final   Influenza B NOT DETECTED NOT DETECTED Final   Parainfluenza Virus 1 NOT DETECTED NOT DETECTED Final   Parainfluenza Virus 2 NOT DETECTED NOT DETECTED Final   Parainfluenza Virus 3 NOT DETECTED NOT DETECTED Final   Parainfluenza Virus 4 NOT DETECTED NOT DETECTED Final   Respiratory Syncytial Virus NOT DETECTED NOT DETECTED Final   Bordetella pertussis NOT DETECTED NOT DETECTED Final   Chlamydophila pneumoniae NOT DETECTED NOT DETECTED Final   Mycoplasma pneumoniae NOT DETECTED NOT DETECTED Final    Comment: Performed at [facility], [place]. [address] [place], [place] [address]  Culture, Urine     Status: None   Collection Time: 08/04/19  3:08 PM   Specimen: Urine, Random  Result Value Ref Range Status   Specimen Description   Final    URINE, RANDOM Performed at [place] [address] [place], [place] [address]    Special Requests   Final    NONE Performed at [facility], [place] [address], [place], [place] [address]    Culture   Final    NO GROWTH Performed at [facility], [place] [address] [place], [place] [address]    Report Status 08/05/2019 FINAL  Final  Culture, Urine     Status: None   Collection Time: 08/07/19  4:36 PM   Specimen: Urine, Clean Catch  Result Value Ref Range Status   Specimen Description   Final    URINE, CLEAN CATCH Performed at [facility], [place] [address] [place], [place] [address]    Special Requests   Final    NONE Performed at [facility], [place] [address], [place], [place] [address]    Culture   Final    NO GROWTH Performed at [facility], [place] [address], [place], [place] [address]    Report Status 08/09/2019 FINAL  Final          Radiology Studies: VAS [place] LOWER EXTREMITY VENOUS (DVT)  Result Date: 08/08/2019  Lower Venous  DVTStudy Indications: Hypoxia, covid+.  Comparison Study: no prior Performing Technologist: Abram Sander RVS  Examination Guidelines: A complete evaluation includes B-mode imaging, spectral Doppler, color Doppler, and power Doppler as needed of all accessible portions of each vessel. Bilateral testing is considered an integral part of a complete examination. Limited examinations for reoccurring indications may be performed as noted. The reflux portion of the exam is performed with the patient in reverse Trendelenburg.  +---------+---------------+---------+-----------+----------+--------------+ RIGHT    CompressibilityPhasicitySpontaneityPropertiesThrombus Aging +---------+---------------+---------+-----------+----------+--------------+ CFV      Full           Yes      Yes                                 +---------+---------------+---------+-----------+----------+--------------+ SFJ      Full                                                        +---------+---------------+---------+-----------+----------+--------------+ FV Prox  Full                                                        +---------+---------------+---------+-----------+----------+--------------+ FV Mid   Full                                                        +---------+---------------+---------+-----------+----------+--------------+ FV DistalFull                                                        +---------+---------------+---------+-----------+----------+--------------+ PFV      Full                                                        +---------+---------------+---------+-----------+----------+--------------+ POP      Full           Yes      Yes                                 +---------+---------------+---------+-----------+----------+--------------+ PTV      Full                                                         +---------+---------------+---------+-----------+----------+--------------+ PERO     Full                                                        +---------+---------------+---------+-----------+----------+--------------+   +---------+---------------+---------+-----------+----------+--------------+  LEFT     CompressibilityPhasicitySpontaneityPropertiesThrombus Aging +---------+---------------+---------+-----------+----------+--------------+ CFV      Full           Yes      Yes                                 +---------+---------------+---------+-----------+----------+--------------+ SFJ      Full                                                        +---------+---------------+---------+-----------+----------+--------------+ FV Prox  Full                                                        +---------+---------------+---------+-----------+----------+--------------+ FV Mid   Full                                                        +---------+---------------+---------+-----------+----------+--------------+ FV DistalFull                                                        +---------+---------------+---------+-----------+----------+--------------+ PFV      Full                                                        +---------+---------------+---------+-----------+----------+--------------+ POP      Full           Yes      Yes                                 +---------+---------------+---------+-----------+----------+--------------+ PTV      Full                                                        +---------+---------------+---------+-----------+----------+--------------+ PERO     Full                                                        +---------+---------------+---------+-----------+----------+--------------+     Summary: BILATERAL: - No evidence of deep vein thrombosis seen in  the lower extremities, bilaterally.   *See table(s) above for measurements and observations. Electronically signed by Curt Jews MD on 08/08/2019 at 8:17:03 AM.    Final  Scheduled Meds: . amLODipine  10 mg Oral Daily  . vitamin C  500 mg Oral Daily  . aspirin  81 mg Oral Daily  . atorvastatin  40 mg Oral QPM  . dexamethasone (DECADRON) injection  6 mg Intravenous Daily  . furosemide  40 mg Intravenous Daily  . heparin  5,000 Units Subcutaneous Q8H  . insulin aspart  0-5 Units Subcutaneous QHS  . insulin aspart  0-6 Units Subcutaneous TID WC  . insulin glargine  30 Units Subcutaneous Daily  . Ipratropium-Albuterol  1 puff Inhalation TID  . linagliptin  5 mg Oral Daily  . metoprolol tartrate  5 mg Intravenous Q8H  . risperiDONE  1 mg Oral BID  . zinc sulfate  220 mg Oral Daily   Continuous Infusions: . famotidine (PEPCID) IV       LOS: 5 days    Time spent: 40 minutes    Irine Seal, MD [facility]   To contact the attending provider between 7A-7P or the covering provider during after hours 7P-7A, please log into the web site [email or web address] and access using universal [place] password for that web site. If you do not have the password, please call the hospital operator.  08/09/2019, 10:31 AM

## 2019-08-10 ENCOUNTER — Inpatient Hospital Stay (HOSPITAL_COMMUNITY): Payer: 59

## 2019-08-10 LAB — COMPREHENSIVE METABOLIC PANEL
ALT: 120 U/L — ABNORMAL HIGH (ref 0–44)
AST: 118 U/L — ABNORMAL HIGH (ref 15–41)
Albumin: 3.3 g/dL — ABNORMAL LOW (ref 3.5–5.0)
Alkaline Phosphatase: 68 U/L (ref 38–126)
Anion gap: 13 (ref 5–15)
BUN: 100 mg/dL — ABNORMAL HIGH (ref 8–23)
CO2: 24 mmol/L (ref 22–32)
Calcium: 8.8 mg/dL — ABNORMAL LOW (ref 8.9–10.3)
Chloride: 111 mmol/L (ref 98–111)
Creatinine, Ser: 3.45 mg/dL — ABNORMAL HIGH (ref 0.61–1.24)
GFR calc Af Amer: 21 mL/min — ABNORMAL LOW (ref >60)
GFR calc non Af Amer: 18 mL/min — ABNORMAL LOW (ref >60)
Glucose, Bld: 320 mg/dL — ABNORMAL HIGH (ref 70–99)
Potassium: 4.3 mmol/L (ref 3.5–5.1)
Sodium: 148 mmol/L — ABNORMAL HIGH (ref 135–145)
Total Bilirubin: 0.9 mg/dL (ref 0.3–1.2)
Total Protein: 6.3 g/dL — ABNORMAL LOW (ref 6.5–8.1)

## 2019-08-10 LAB — CBC WITH DIFFERENTIAL/PLATELET
Abs Immature Granulocytes: 0.36 10*3/uL — ABNORMAL HIGH (ref 0.00–0.07)
Basophils Absolute: 0.1 10*3/uL (ref 0.0–0.1)
Basophils Relative: 1 %
Eosinophils Absolute: 0 10*3/uL (ref 0.0–0.5)
Eosinophils Relative: 0 %
HCT: 47.4 % (ref 39.0–52.0)
Hemoglobin: 15.3 g/dL (ref 13.0–17.0)
Immature Granulocytes: 2 %
Lymphocytes Relative: 4 %
Lymphs Abs: 0.5 10*3/uL — ABNORMAL LOW (ref 0.7–4.0)
MCH: 28.2 pg (ref 26.0–34.0)
MCHC: 32.3 g/dL (ref 30.0–36.0)
MCV: 87.5 fL (ref 80.0–100.0)
Monocytes Absolute: 1.4 10*3/uL — ABNORMAL HIGH (ref 0.1–1.0)
Monocytes Relative: 10 %
Neutro Abs: 12.4 10*3/uL — ABNORMAL HIGH (ref 1.7–7.7)
Neutrophils Relative %: 83 %
Platelets: 204 10*3/uL (ref 150–400)
RBC: 5.42 MIL/uL (ref 4.22–5.81)
RDW: 13.5 % (ref 11.5–15.5)
WBC: 14.7 10*3/uL — ABNORMAL HIGH (ref 4.0–10.5)
nRBC: 0 % (ref 0.0–0.2)

## 2019-08-10 LAB — GLUCOSE, CAPILLARY
Glucose-Capillary: 297 mg/dL — ABNORMAL HIGH (ref 70–99)
Glucose-Capillary: 274 mg/dL — ABNORMAL HIGH (ref 70–99)
Glucose-Capillary: 309 mg/dL — ABNORMAL HIGH (ref 70–99)
Glucose-Capillary: 349 mg/dL — ABNORMAL HIGH (ref 70–99)
Glucose-Capillary: 393 mg/dL — ABNORMAL HIGH (ref 70–99)

## 2019-08-10 LAB — URINALYSIS, ROUTINE W REFLEX MICROSCOPIC
Bacteria, UA: NONE SEEN
Bilirubin Urine: NEGATIVE
Glucose, UA: 50 mg/dL — AB
Ketones, ur: NEGATIVE mg/dL
Leukocytes,Ua: NEGATIVE
Nitrite: NEGATIVE
Protein, ur: NEGATIVE mg/dL
Specific Gravity, Urine: 1.011 (ref 1.005–1.030)
pH: 5 (ref 5.0–8.0)

## 2019-08-10 LAB — C-REACTIVE PROTEIN: CRP: 1.1 mg/dL — ABNORMAL HIGH (ref <1.0)

## 2019-08-10 LAB — FERRITIN: Ferritin: 940 ng/mL — ABNORMAL HIGH (ref 24–336)

## 2019-08-10 LAB — SODIUM, URINE, RANDOM: Sodium, Ur: 76 mmol/L

## 2019-08-10 LAB — MAGNESIUM: Magnesium: 3.5 mg/dL — ABNORMAL HIGH (ref 1.7–2.4)

## 2019-08-10 LAB — D-DIMER, QUANTITATIVE: D-Dimer, Quant: 1.13 ug/mL-FEU — ABNORMAL HIGH (ref 0.00–0.50)

## 2019-08-10 LAB — CREATININE, URINE, RANDOM: Creatinine, Urine: 93.29 mg/dL

## 2019-08-10 MED ORDER — DEXTROSE 5 % IV SOLN: INTRAVENOUS | Status: DC

## 2019-08-10 NOTE — Progress Notes (Signed)
In and out cath performed for urine culture, obtained 800 mls yellow urine without difficulty.  Patient has been wearing condom cath with urine output. Specimens sent.

## 2019-08-10 NOTE — Plan of Care (Signed)
Discussed in front of patient plan of care, mouth care and medications for bedtime with no evidence of learning at this time.

## 2019-08-10 NOTE — Progress Notes (Addendum)
PROGRESS NOTE    Lucas Hobbs  IRW:[id number] DOB: [birth year] DOA: 08/03/2019 PCP: Jiles Garter, MD   Brief Narrative:  HPI per Dr. Jimmy Picket Cortez is a 64 y.o. male with history of diabetes mellitus type 2, chronic kidney disease stage IV, hypertension, CAD status post CABG in 2019, hypertension presented to the ER because of persistent nausea vomiting and diarrhea with the last 1 week.  Patient states he has been having persistent vomiting with no blood in it and occasional diarrhea.  Denied any abdominal pain.  Had been a constant hiccups.  He still takes insulin.  When EMS was called in CBG was around 18.  Was given D10 and Zofran.  Blood sugar improved to 114.  ED Course: In the ER patient blood sugar again dropped around 60s and lab work show creatinine of 2.9 AST 78 platelets 132 EKG normal sinus rhythm chest x-ray unremarkable.  Covid test pending.  On exam abdomen appears benign.  Patient was started on D5 normal saline and admitted for hypoglycemia and persistent nausea vomiting.  With hiccups.  Assessment & Plan:   Principal Problem:   Hypoglycemia Active Problems:   CKD (chronic kidney disease), stage IV (HCC)   Nausea & vomiting   Essential hypertension   DM (diabetes mellitus), type 2 with renal complications (HCC)   CAD (coronary artery disease)   COVID-19   Pneumonia due to COVID-19 virus  1 COVID-19 infection/ COVID-19 positive test (U07.1, COVID-19) with Acute Pneumonia (J12.89, Other viral pneumonia) (If respiratory failure or sepsis present, add as separate assessment) Patient had presented with nausea vomiting and diarrhea and noted to be hypoglycemic on presentation.  CT abdomen and pelvis which was done showed possible infiltrates in the lung concerning for Covid pneumonia.  COVID-19 PCR positive.  Inflammatory markers are ferritin, CRP, D-dimer noted to be elevated.  CRP and D-dimer trending down.  Ferritin fluctuating.  Patient noted not to be hypoxic  with increased O2 requirements currently on 4 L nasal cannula with sats of 94 %.  BNP noted to be 127.2 from 31.2 (08/04/19).chest x-ray consistent with multifocal airspace opacity new from prior study appearance consistent with multifocal pneumonia.  Patient received a dose of Lasix 40 mg IV x1(08/07/2019) with a urine output of 0.800 L over the past 24 hours.  Patient also noted to have chronic kidney disease.  Continue IV remdesivir and Decadron.  Patient was started empirically on IV Unasyn due to concerns for aspiration.  Transitioned from IV Unasyn to Augmentin which is subsequently been discontinued.  Procalcitonin noted to be negative.  Discontinued antibiotics.  Continue Combivent inhaler, zinc, vitamin C. discontinue diuretics due to worsening renal function. Lower extremity Dopplers negative for DVT.    2.  Intractable nausea vomiting diarrhea Likely secondary to problem #1.  CT abdomen and pelvis with no acute abnormalities to explain nausea and vomiting.  Improved clinically with treatment of #1.  Patient's diet has been advanced and was tolerating a carb modified diet.  Patient currently confused and not taking any oral intake over the past few days.  No diarrhea.  Supportive care.  Follow..  3.  Hypertension Continue Norvasc.  ACE inhibitor on hold secondary to renal function.  Patient was on IV Lasix which has subsequently been discontinued due to worsening renal function.  Beta-blocker held due to bradycardia.  Follow.   4.  Coronary artery disease status post CABG/elevated troponin Currently asymptomatic.  Troponin is elevated however seems to have plateaued.  2D echo  obtained with EF of 55 to 60%, no wall motion abnormalities.  Elevated troponin likely demand ischemia secondary to problem #1.  Continue home regimen of Norvasc, statin.  Discontinue beta-blocker due to bradycardia.   5.  Diabetes mellitus type 2/Hypoglycemia Hemoglobin A1c 7.5.  Patient noted to be hypoglycemic on  presentation which was felt secondary to poor oral intake in the setting of ongoing insulin.  Patient initially was placed on D5 however due to elevated blood glucose levels D5 has been discontinued patient on normal saline.  Patient also on steroids and as such CBGs elevated.  Blood glucose level of 297 this morning.  Continue Lantus to 30 units daily.  Continue Tradjenta.  Sliding scale insulin.   6.  Acute renal failure on chronic kidney disease stage IV/urinary retention Creatinine currently at 3.45 from 2.19.  BUN elevated at 100.  Likely secondary to a prerenal azotemia versus post renal azotemia.  Patient has been confused with poor oral intake over the past 24 to 48 hours.  Per RN when I and O cath was done for urinalysis and urine electrolytes patient noted to have 800 cc of urine output.  Check a urine sodium, urine creatinine, UA with cultures and sensitivities.  Check a renal ultrasound.  Place on IV fluids.  Place a Foley catheter.  Monitor urine output.  If no significant improvement with renal function in the next 24 hours will need consultation with nephrology.  Diuretics have been discontinued.  Follow.   7.?  Confusion/acute metabolic encephalopathy Likely multifactorial secondary to hypoxia, possible steroid induced, acute infection and now acute on chronic kidney disease(?  Uremia)..  Patient for the past few days noted to be confused, combative requiring Ativan Haldol and respite all.  Patient also has been placed in soft restraints.  Ammonia levels within normal limits.  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  No abnormality noted on head CT.  Discontinued Risperdal and Haldol due to QTC prolongation.  Patient now minimally responsive however renal function has worsened significantly over the past 24 hours.  Creatinine currently at 3.45 from 2.19 with a BUN now of 100.  Check a UA with cultures and sensitivities,  check a urine sodium, check a urine creatinine.  Place on IV fluids.  Lasix has been discontinued.  Check a renal ultrasound.  Repeat UA with cultures and sensitivities.  Check blood cultures x2. Follow.    8.  QTC prolongation Magnesium level greater than 2.  Potassium at 4.3.  Repeat EKG with resolution of QTC prolongation.  Haldol, Resporal and Zofran discontinued.  Avoid QTC prolongation medications..   9.  Bradycardia Patient noted to have some bradycardic episodes in the 40s with some junctional rhythms noted per RN.  Beta-blocker discontinued.   .   DVT prophylaxis: Heparin Code Status: Full Family Communication: No family at bedside. Disposition Plan:  . Patient came from: Home            . Anticipated d/c place: Home. . Barriers to d/c OR conditions which need to be met to effect a safe d/c: Likely home when clinically improved, completion of IV remdesivir, improvement with hypoxia and confusion and mental status back to baseline and one more alert..   Consultants:   None  Procedures:   CT renal stone protocol 08/04/2019  Chest x-ray 08/03/2019  Abdominal ultrasound 08/04/2019  2D echo 08/04/2019  Chest x-ray 08/07/2019  CT head 08/06/2019  Abdominal  films 08/09/2019  Renal ultrasound pending 08/10/2019  Antimicrobials:  IV Unasyn 08/04/2019>>>> 08/06/2019  Augmentin 08/06/2019>>>>> 08/08/2019   Subjective: Patient minimally responsive.  Patient arousable but drifts back off to sleep.  Mittens on.      Objective: Vitals:   08/10/19 0315 08/10/19 0331 08/10/19 0615 08/10/19 0715  BP:   (!) 195/105 133/71  Pulse:   73 62  Resp: 14  (!) 23   Temp:   97.9 F (36.6 C)   TempSrc:   Oral   SpO2:   94% 94%  Weight:  68.6 kg    Height:        Intake/Output Summary (Last 24 hours) at 08/10/2019 1122 Last data filed at 08/10/2019 1008 Gross per 24 hour  Intake 370 ml  Output 1625 ml  Net -1255 ml   Filed Weights   08/07/19 0905 08/08/19 0322 08/10/19 0331  Weight:  73.8 kg 75.4 kg 68.6 kg    Examination:  General exam: Minimally responsive. Respiratory system: Clear to auscultation bilaterally anterior lung fields.  Decreased breath sounds in the bases.  No wheezing.  No use of accessory muscles of respiration.   Cardiovascular system: Regular rate rhythm no murmurs rubs or gallops.  No JVD.  No lower extremity edema.   Gastrointestinal system: Abdomen is soft, bilateral adb flank distension L > R.nontender, positive bowel sounds.  No rebound.  No guarding.  Central nervous system: Patient minimally responsive.  No focal neurological deficits.   Extremities: Symmetric 5 x 5 power. Skin: No rashes, lesions or ulcers Psychiatry: Judgement and insight appear poor. Mood & affect appropriate.     Data Reviewed: I have personally reviewed following labs and imaging studies  CBC: Recent Labs  Lab 08/06/19 0321 08/07/19 0809 08/08/19 0346 08/09/19 0423 08/10/19 0403  WBC 8.9 9.9 10.1 10.9* 14.7*  NEUTROABS 7.5 8.4* 8.7* 8.9* 12.4*  HGB 14.6 12.8* 12.6* 14.4 15.3  HCT 46.5 38.5* 37.8* 43.2 47.4  MCV 90.3 86.7 85.3 85.7 87.5  PLT 157 157 159 166 536   Basic Metabolic Panel: Recent Labs  Lab 08/05/19 0302 08/05/19 0302 08/06/19 0321 08/07/19 0809 08/08/19 0346 08/09/19 0423 08/10/19 0403  NA 140   < > 144 146* 146* 146* 148*  K 4.1   < > 4.2 4.2 3.6 4.3 4.3  CL 107   < > 109 114* 111 111 111  CO2 24   < > 19* '24 23 24 24  ' GLUCOSE 347*   < > 318* 203* 148* 256* 320*  BUN 77*   < > 73* 79* 71* 78* 100*  CREATININE 2.78*   < > 2.53* 2.22* 2.23* 2.19* 3.45*  CALCIUM 7.8*   < > 8.1* 8.0* 8.4* 8.8* 8.8*  MG 2.5*   < > 2.7* 2.8* 2.6* 3.0* 3.5*  PHOS 3.8  --  3.7 3.7 3.6 4.5  --    < > = values in this interval not displayed.   GFR: Estimated Creatinine Clearance: 20.5 mL/min (A) (by C-G formula based on SCr of 3.45 mg/dL (H)). Liver Function Tests: Recent Labs  Lab 08/06/19 0321 08/07/19 0809 08/08/19 0346 08/09/19 0423  08/10/19 0403  AST 60* 89* 168* 209* 118*  ALT 39 46* 74* 114* 120*  ALKPHOS 46 48 60 67 68  BILITOT 0.7 1.0 1.1 1.3* 0.9  PROT 5.8* 5.2* 5.3* 5.9* 6.3*  ALBUMIN 2.8* 2.7* 3.0* 3.3* 3.3*   No results for input(s): LIPASE, AMYLASE in the last 168 hours. Recent Labs  Lab  08/06/19 1722 08/09/19 1107  AMMONIA 14 25   Coagulation Profile: No results for input(s): INR, PROTIME in the last 168 hours. Cardiac Enzymes: No results for input(s): CKTOTAL, CKMB, CKMBINDEX, TROPONINI in the last 168 hours. BNP (last 3 results) No results for input(s): PROBNP in the last 8760 hours. HbA1C: No results for input(s): HGBA1C in the last 72 hours. CBG: Recent Labs  Lab 08/09/19 0741 08/09/19 1157 08/09/19 1716 08/09/19 2105 08/10/19 0650  GLUCAP 240* 248* 244* 338* 297*   Lipid Profile: No results for input(s): CHOL, HDL, LDLCALC, TRIG, CHOLHDL, LDLDIRECT in the last 72 hours. Thyroid Function Tests: No results for input(s): TSH, T4TOTAL, FREET4, T3FREE, THYROIDAB in the last 72 hours. Anemia Panel: Recent Labs    08/09/19 0423 08/10/19 0403  FERRITIN 901* 940*   Sepsis Labs: Recent Labs  Lab 08/04/19 1102 08/07/19 0809 08/08/19 0346 08/09/19 0423  PROCALCITON 0.17 <0.10 <0.10 0.10    Recent Results (from the past 240 hour(s))  SARS CORONAVIRUS 2 (TAT 6-24 HRS) Nasopharyngeal Nasopharyngeal Swab     Status: Abnormal   Collection Time: 08/04/19 12:53 AM   Specimen: Nasopharyngeal Swab  Result Value Ref Range Status   SARS Coronavirus 2 POSITIVE (A) NEGATIVE Final    Comment: RESULT CALLED TO, READ BACK BY AND VERIFIED WITH: Ivin Poot [id number] 08/04/2019 T. TYSOR (NOTE) SARS-CoV-2 target nucleic acids are DETECTED. The SARS-CoV-2 RNA is generally detectable in upper and lower respiratory specimens during the acute phase of infection. Positive results are indicative of the presence of SARS-CoV-2 RNA. Clinical correlation with patient history and other diagnostic information  is  necessary to determine patient infection status. Positive results do not rule out bacterial infection or co-infection with other viruses.  The expected result is Negative. Fact Sheet for Patients: SugarRoll.be Fact Sheet for Healthcare Providers: https://www.woods-mathews.com/ This test is not yet approved or cleared by the [place] FDA and  has been authorized for detection and/or diagnosis of SARS-CoV-2 by FDA under an Emergency Use Authorization (EUA). This EUA will remain  in effect (meaning this test can be used) for th e duration of the COVID-19 declaration under Section 564(b)(1) of the Act, 21 U.S.C. section 360bbb-3(b)(1), unless the authorization is terminated or revoked sooner. Performed at [facility], [place] [address] [place], [place] [address]   Respiratory Panel by PCR     Status: None   Collection Time: 08/04/19 12:53 AM   Specimen: Nasopharyngeal Swab; Respiratory  Result Value Ref Range Status   Adenovirus NOT DETECTED NOT DETECTED Final   Coronavirus 229E NOT DETECTED NOT DETECTED Final    Comment: (NOTE) The Coronavirus on the Respiratory Panel, DOES NOT test for the novel  Coronavirus (2019 nCoV)    Coronavirus HKU1 NOT DETECTED NOT DETECTED Final   Coronavirus NL63 NOT DETECTED NOT DETECTED Final   Coronavirus OC43 NOT DETECTED NOT DETECTED Final   Metapneumovirus NOT DETECTED NOT DETECTED Final   Rhinovirus / Enterovirus NOT DETECTED NOT DETECTED Final   Influenza A NOT DETECTED NOT DETECTED Final   Influenza B NOT DETECTED NOT DETECTED Final   Parainfluenza Virus 1 NOT DETECTED NOT DETECTED Final   Parainfluenza Virus 2 NOT DETECTED NOT DETECTED Final   Parainfluenza Virus 3 NOT DETECTED NOT DETECTED Final   Parainfluenza Virus 4 NOT DETECTED NOT DETECTED Final   Respiratory Syncytial Virus NOT DETECTED NOT DETECTED Final   Bordetella pertussis NOT DETECTED NOT DETECTED Final   Chlamydophila  pneumoniae NOT DETECTED NOT DETECTED Final  Mycoplasma pneumoniae NOT DETECTED NOT DETECTED Final    Comment: Performed at [facility], [place] [address] [place], [place] [address]  Culture, Urine     Status: None   Collection Time: 08/04/19  3:08 PM   Specimen: Urine, Random  Result Value Ref Range Status   Specimen Description   Final    URINE, RANDOM Performed at [place] [address], [place], [place] [address]    Special Requests   Final    NONE Performed at [facility], [place] [address] [place], [place] [address]    Culture   Final    NO GROWTH Performed at [facility], [place] [address] [place], [place] [address]    Report Status 08/05/2019 FINAL  Final  Culture, Urine     Status: None   Collection Time: 08/07/19  4:36 PM   Specimen: Urine, Clean Catch  Result Value Ref Range Status   Specimen Description   Final    URINE, CLEAN CATCH Performed at [facility], [place] [address] [place], [place] [address]    Special Requests   Final    NONE Performed at [facility], [place] [address] [place], [place] [address]    Culture   Final    NO GROWTH Performed at [facility], [place] [address], [place], [place] [address]    Report Status 08/09/2019 FINAL  Final         Radiology Studies: DG Abd Portable 2V  Result Date: 08/09/2019 CLINICAL DATA:  COVID-19 positive. EXAM: PORTABLE ABDOMEN - 2 VIEW COMPARISON:  None. FINDINGS: The bowel gas pattern is normal. There is no evidence of free air. No radio-opaque calculi or other significant radiographic abnormality is seen. IMPRESSION: No evidence of bowel obstruction or ileus. Electronically Signed   By: Marijo Conception M.D.   On: 08/09/2019 12:08        Scheduled Meds: . amLODipine  10 mg Oral Daily  . vitamin C  500 mg Oral Daily  . aspirin  81 mg Oral Daily  . atorvastatin  40 mg Oral QPM  . dexamethasone (DECADRON)  injection  6 mg Intravenous Daily  . heparin  5,000 Units Subcutaneous Q8H  . insulin aspart  0-5 Units Subcutaneous QHS  . insulin aspart  0-6 Units Subcutaneous TID WC  . insulin glargine  30 Units Subcutaneous Daily  . Ipratropium-Albuterol  1 puff Inhalation TID  . linagliptin  5 mg Oral Daily  . zinc sulfate  220 mg Oral Daily   Continuous Infusions: . dextrose 75 mL/hr at 08/10/19 0954  . famotidine (PEPCID) IV Stopped (08/09/19 1850)     LOS: 6 days    Time spent: 40 minutes    Irine Seal, MD [facility]   To contact the attending provider between 7A-7P or the covering provider during after hours 7P-7A, please log into the web site [email or web address] and access using universal [place] password for that web site. If you do not have the password, please call the hospital operator.  08/10/2019, 11:22 AM

## 2019-08-10 NOTE — Progress Notes (Signed)
Inpatient Diabetes Program Recommendations  AACE/ADA: New Consensus Statement on Inpatient Glycemic Control (2015)  Target Ranges:  Prepandial:   less than 140 mg/dL      Peak postprandial:   less than 180 mg/dL (1-2 hours)      Critically ill patients:  140 - 180 mg/dL   Lab Results  Component Value Date   GLUCAP 297 (H) 08/10/2019   HGBA1C 7.5 (H) 08/05/2019    Review of Glycemic Control  Diabetes history: DM2 Outpatient Diabetes medications: NPH 70/30 38 units in am and 36 units in pm Current orders for Inpatient glycemic control: Lantus 30 units QD, Novolog 0-6 units tidwc and HS, tradjenta 5 mg QD  HgbA1C - 7.5% - likely has lows at home. Covid 19+ on Decadron 6 mg QD Creat 3.45 this am.  Inpatient Diabetes Program Recommendations:     Increase Lantus to 35 units QD Add Novolog 3 units tidwc if pt eats > 50% meal May need to increase Novolog correction to 0-9 units tidwc   Follow closely.  Thank you. Lorenda Peck, RD, LDN, CDE Inpatient Diabetes Coordinator [phone]

## 2019-08-11 ENCOUNTER — Inpatient Hospital Stay (HOSPITAL_COMMUNITY): Payer: 59

## 2019-08-11 LAB — URINE CULTURE: Culture: NO GROWTH

## 2019-08-11 LAB — RENAL FUNCTION PANEL
Albumin: 3.2 g/dL — ABNORMAL LOW (ref 3.5–5.0)
Anion gap: 11 (ref 5–15)
BUN: 110 mg/dL — ABNORMAL HIGH (ref 8–23)
CO2: 28 mmol/L (ref 22–32)
Calcium: 8.8 mg/dL — ABNORMAL LOW (ref 8.9–10.3)
Chloride: 108 mmol/L (ref 98–111)
Creatinine, Ser: 3.11 mg/dL — ABNORMAL HIGH (ref 0.61–1.24)
GFR calc Af Amer: 23 mL/min — ABNORMAL LOW (ref >60)
GFR calc non Af Amer: 20 mL/min — ABNORMAL LOW (ref >60)
Glucose, Bld: 387 mg/dL — ABNORMAL HIGH (ref 70–99)
Phosphorus: 5.8 mg/dL — ABNORMAL HIGH (ref 2.5–4.6)
Potassium: 4.4 mmol/L (ref 3.5–5.1)
Sodium: 147 mmol/L — ABNORMAL HIGH (ref 135–145)

## 2019-08-11 LAB — CBC WITH DIFFERENTIAL/PLATELET
Abs Immature Granulocytes: 0.41 10*3/uL — ABNORMAL HIGH (ref 0.00–0.07)
Basophils Absolute: 0.1 10*3/uL (ref 0.0–0.1)
Basophils Relative: 0 %
Eosinophils Absolute: 0 10*3/uL (ref 0.0–0.5)
Eosinophils Relative: 0 %
HCT: 47.7 % (ref 39.0–52.0)
Hemoglobin: 15.1 g/dL (ref 13.0–17.0)
Immature Granulocytes: 3 %
Lymphocytes Relative: 4 %
Lymphs Abs: 0.5 10*3/uL — ABNORMAL LOW (ref 0.7–4.0)
MCH: 28.2 pg (ref 26.0–34.0)
MCHC: 31.7 g/dL (ref 30.0–36.0)
MCV: 89 fL (ref 80.0–100.0)
Monocytes Absolute: 1 10*3/uL (ref 0.1–1.0)
Monocytes Relative: 7 %
Neutro Abs: 11.8 10*3/uL — ABNORMAL HIGH (ref 1.7–7.7)
Neutrophils Relative %: 86 %
Platelets: 186 10*3/uL (ref 150–400)
RBC: 5.36 MIL/uL (ref 4.22–5.81)
RDW: 13.5 % (ref 11.5–15.5)
WBC: 13.8 10*3/uL — ABNORMAL HIGH (ref 4.0–10.5)
nRBC: 0.1 % (ref 0.0–0.2)

## 2019-08-11 LAB — GLUCOSE, CAPILLARY
Glucose-Capillary: 329 mg/dL — ABNORMAL HIGH (ref 70–99)
Glucose-Capillary: 371 mg/dL — ABNORMAL HIGH (ref 70–99)
Glucose-Capillary: 361 mg/dL — ABNORMAL HIGH (ref 70–99)

## 2019-08-11 LAB — MAGNESIUM: Magnesium: 3.5 mg/dL — ABNORMAL HIGH (ref 1.7–2.4)

## 2019-08-11 MED ORDER — HYDRALAZINE HCL 20 MG/ML IJ SOLN
10.0000 mg | Freq: Once | INTRAMUSCULAR | Status: DC
  Filled 2019-08-11: qty 1

## 2019-08-11 MED ORDER — TAMSULOSIN HCL 0.4 MG PO CAPS
0.4000 mg | ORAL_CAPSULE | Freq: Every day | ORAL | Status: DC
  Administered 2019-08-12 – 2019-08-17 (×6): 0.4 mg via ORAL
  Filled 2019-08-11 (×7): qty 1

## 2019-08-11 MED ORDER — INSULIN GLARGINE 100 UNIT/ML ~~LOC~~ SOLN
35.0000 [IU] | Freq: Every day | SUBCUTANEOUS | Status: DC
  Administered 2019-08-12 – 2019-08-14 (×3): 35 [IU] via SUBCUTANEOUS
  Filled 2019-08-11 (×3): qty 0.35

## 2019-08-11 MED ORDER — SODIUM CHLORIDE 0.9 % IV SOLN: INTRAVENOUS | Status: DC

## 2019-08-11 NOTE — Progress Notes (Signed)
PROGRESS NOTE    Thorne Wirz  ZOX:[id number] DOB: 1955/09/21 DOA: 08/03/2019 PCP: Jiles Garter, MD  Brief Narrative: HPI per Dr. Jimmy Picket Brownis a 64 y.o.malewithhistory of diabetes mellitus type 2, chronic kidney disease stage IV, hypertension, CAD status post CABG in 2019, hypertension presented to the ER because of persistent nausea vomiting and diarrhea with the last 1 week. Patient states he has been having persistent vomiting with no blood in it and occasional diarrhea. Denied any abdominal pain. Had been a constant hiccups. He still takes insulin. When EMS was called in CBG was around 53. Was given D10 and Zofran. Blood sugar improved to 114.  ED Course:In the ER patient blood sugar again dropped around 60s and lab work show creatinine of 2.9 AST 78 platelets 132 EKG normal sinus rhythm chest x-ray unremarkable. Covid test pending. On exam abdomen appears benign. Patient was started on D5 normal saline and admitted for hypoglycemia and persistent nausea vomiting. With hiccups.   Assessment & Plan:   Principal Problem:   Hypoglycemia Active Problems:   CKD (chronic kidney disease), stage IV (HCC)   Nausea & vomiting   Essential hypertension   DM (diabetes mellitus), type 2 with renal complications (HCC)   CAD (coronary artery disease)   COVID-19   Pneumonia due to COVID-19 virus   #1 acute hypoxic respiratory failure secondary to Covid pneumonia -covid 19 positive-he is admitted with nausea vomiting and diarrhea and hypoglycemia. He is noted to be hypoxic with increasing oxygen requirement 4 L with saturation 94%. S/p remdesvir On decadron Was treated for possible aspiration pneumonia with Unasyn/Augmentin.  This was stopped as his procalcitonin was negative. Chest x-ray from 08/07/2019 multifocal pneumonia. Titrate oxygen recheck chest x-ray.  #2 intractable nausea vomiting and diarrhea likely secondary to Covid resolving CT of the abdomen and  pelvis shows no acute abnormalities.  #3 hypertension continue norvasc blood pressure 138/70  #4 CAD status post CABG-had elevated troponin likely from demand ischemia secondary to Covid.  Continue statins and Norvasc.  Beta-blocker stopped due to bradycardia.  #5 type 2 diabetes with hyperglycemia-he was hypoglycemic on presentation now with being on steroids his blood sugar has been running high.  Appreciate diabetic coordinator input.  Will increase Lantus to 35 units.  #6 AKI on CKD stage IV creatinine slowly improving continue IV fluids.  Renal ultrasound shows no hydronephrosis.  However his bladder is distended with Foley catheter in place.  Restart Flomax.  #7 acute metabolic encephalopathy-likely multifactorial steroids/hypoxia/uremia-ammonia levels normal.  CT head shows no acute changes.  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  He was treated with Risperdal and Haldol which was stopped due to QTC prolongation. He had an episode of unresponsiveness last night and rapid response notes reviewed.  #8 QT prolongation-keep electrolytes normal hold antipsychotics.  Hold Zofran.  Magnesium level high  #9 bradycardia  Resolved  #10 hypermagnesemia-patient is not on any magnesium supplements.  Check mag level today.  Magnesium 3.5 as of yesterday.paged renal.  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   Pressure Injury 08/09/19 Buttocks Left Stage 1 -  Intact skin with non-blanchable redness of a localized area usually over a bony prominence. (Active)  08/09/19 2230  Location: Buttocks  Location Orientation: Left  Staging: Stage 1 -  Intact skin with non-blanchable redness of a localized area usually over a bony prominence.  Wound Description (Comments):   Present on Admission:         Estimated body mass index is 23.69 kg/m as calculated  from the following:   Height as of this encounter: 5\' 7"  (1.702 m).   Weight as of this encounter: 68.6 kg.  DVT prophylaxis: heparin Code Status: full Family  Communication:none Disposition Plan: Patient came from home Anticipated DC place home Barrier to discharge patient with confusion new onset and persistent hypoxia  Consultants: None   Procedures: None Antimicrobials:  None Subjective: He is resting in bed does not open his eyes when I call his name however he responds to pain  Objective: Vitals:   08/11/19 0140 08/11/19 0242 08/11/19 0629 08/11/19 1119  BP: (!) 140/109 (!) 133/59 (!) 144/63 138/70  Pulse: 64 (!) 57 (!) 56 65  Resp:    14  Temp:   97.8 F (36.6 C) 97.6 F (36.4 C)  TempSrc:   Axillary Oral  SpO2: 98%  94% 97%  Weight:      Height:        Intake/Output Summary (Last 24 hours) at 08/11/2019 1319 Last data filed at 08/11/2019 1305 Gross per 24 hour  Intake 659.17 ml  Output 2575 ml  Net -1915.83 ml   Filed Weights   08/07/19 0905 08/08/19 0322 08/10/19 0331  Weight: 73.8 kg 75.4 kg 68.6 kg    Examination:  General exam: Appears calm and comfortable  Respiratory system: Clear to auscultation. Respiratory effort normal. Cardiovascular system: S1 & S2 heard, RRR. No JVD, murmurs, rubs, gallops or clicks. No pedal edema. Gastrointestinal system: Abdomen is nondistended, soft and nontender. No organomegaly or masses felt. Normal bowel sounds heard. Central nervous system: Confused  extremities: Symmetric 5 x 5 power. Skin: No rashes, lesions or ulcers Psychiatry: Unable to assess  Data Reviewed: I have personally reviewed following labs and imaging studies  CBC: Recent Labs  Lab 08/07/19 0809 08/08/19 0346 08/09/19 0423 08/10/19 0403 08/11/19 0407  WBC 9.9 10.1 10.9* 14.7* 13.8*  NEUTROABS 8.4* 8.7* 8.9* 12.4* 11.8*  HGB 12.8* 12.6* 14.4 15.3 15.1  HCT 38.5* 37.8* 43.2 47.4 47.7  MCV 86.7 85.3 85.7 87.5 89.0  PLT 157 159 166 204 950   Basic Metabolic Panel: Recent Labs  Lab 08/06/19 0321 08/06/19 0321 08/07/19 0809 08/08/19 0346 08/09/19 0423 08/10/19 0403 08/11/19 0407  NA 144   < >  146* 146* 146* 148* 147*  K 4.2   < > 4.2 3.6 4.3 4.3 4.4  CL 109   < > 114* 111 111 111 108  CO2 19*   < > 24 23 24 24 28   GLUCOSE 318*   < > 203* 148* 256* 320* 387*  BUN 73*   < > 79* 71* 78* 100* 110*  CREATININE 2.53*   < > 2.22* 2.23* 2.19* 3.45* 3.11*  CALCIUM 8.1*   < > 8.0* 8.4* 8.8* 8.8* 8.8*  MG 2.7*  --  2.8* 2.6* 3.0* 3.5*  --   PHOS 3.7  --  3.7 3.6 4.5  --  5.8*   < > = values in this interval not displayed.   GFR: Estimated Creatinine Clearance: 22.7 mL/min (A) (by C-G formula based on SCr of 3.11 mg/dL (H)). Liver Function Tests: Recent Labs  Lab 08/06/19 0321 08/06/19 0321 08/07/19 0809 08/08/19 0346 08/09/19 0423 08/10/19 0403 08/11/19 0407  AST 60*  --  89* 168* 209* 118*  --   ALT 39  --  46* 74* 114* 120*  --   ALKPHOS 46  --  48 60 67 68  --   BILITOT 0.7  --  1.0 1.1 1.3*  0.9  --   PROT 5.8*  --  5.2* 5.3* 5.9* 6.3*  --   ALBUMIN 2.8*   < > 2.7* 3.0* 3.3* 3.3* 3.2*   < > = values in this interval not displayed.   No results for input(s): LIPASE, AMYLASE in the last 168 hours. Recent Labs  Lab 08/06/19 1722 08/09/19 1107  AMMONIA 14 25   Coagulation Profile: No results for input(s): INR, PROTIME in the last 168 hours. Cardiac Enzymes: No results for input(s): CKTOTAL, CKMB, CKMBINDEX, TROPONINI in the last 168 hours. BNP (last 3 results) No results for input(s): PROBNP in the last 8760 hours. HbA1C: No results for input(s): HGBA1C in the last 72 hours. CBG: Recent Labs  Lab 08/10/19 1735 08/10/19 2040 08/11/19 0136 08/11/19 0803 08/11/19 1116  GLUCAP 349* 393* 329* 371* 361*   Lipid Profile: No results for input(s): CHOL, HDL, LDLCALC, TRIG, CHOLHDL, LDLDIRECT in the last 72 hours. Thyroid Function Tests: No results for input(s): TSH, T4TOTAL, FREET4, T3FREE, THYROIDAB in the last 72 hours. Anemia Panel: Recent Labs    08/09/19 0423 08/10/19 0403  FERRITIN 901* 940*   Sepsis Labs: Recent Labs  Lab 08/07/19 0809  08/08/19 0346 08/09/19 0423  PROCALCITON <0.10 <0.10 0.10    Recent Results (from the past 240 hour(s))  SARS CORONAVIRUS 2 (TAT 6-24 HRS) Nasopharyngeal Nasopharyngeal Swab     Status: Abnormal   Collection Time: 08/04/19 12:53 AM   Specimen: Nasopharyngeal Swab  Result Value Ref Range Status   SARS Coronavirus 2 POSITIVE (A) NEGATIVE Final    Comment: RESULT CALLED TO, READ BACK BY AND VERIFIED WITH: Ivin Poot 0109 08/04/2019 T. TYSOR (NOTE) SARS-CoV-2 target nucleic acids are DETECTED. The SARS-CoV-2 RNA is generally detectable in upper and lower respiratory specimens during the acute phase of infection. Positive results are indicative of the presence of SARS-CoV-2 RNA. Clinical correlation with patient history and other diagnostic information is  necessary to determine patient infection status. Positive results do not rule out bacterial infection or co-infection with other viruses.  The expected result is Negative. Fact Sheet for Patients: SugarRoll.be Fact Sheet for Healthcare Providers: https://www.woods-.com/ This test is not yet approved or cleared by the [place] FDA and  has been authorized for detection and/or diagnosis of SARS-CoV-2 by FDA under an Emergency Use Authorization (EUA). This EUA will remain  in effect (meaning this test can be used) for th e duration of the COVID-19 declaration under Section 564(b)(1) of the Act, 21 U.S.C. section 360bbb-3(b)(1), unless the authorization is terminated or revoked sooner. Performed at [facility], [place] [address] [place] [address], [place], [place] [address]   Respiratory Panel by PCR     Status: None   Collection Time: 08/04/19 12:53 AM   Specimen: Nasopharyngeal Swab; Respiratory  Result Value Ref Range Status   Adenovirus NOT DETECTED NOT DETECTED Final   Coronavirus 229E NOT DETECTED NOT DETECTED Final    Comment: (NOTE) The Coronavirus on the Respiratory Panel, DOES NOT  test for the novel  Coronavirus (2019 nCoV)    Coronavirus HKU1 NOT DETECTED NOT DETECTED Final   Coronavirus NL63 NOT DETECTED NOT DETECTED Final   Coronavirus OC43 NOT DETECTED NOT DETECTED Final   Metapneumovirus NOT DETECTED NOT DETECTED Final   Rhinovirus / Enterovirus NOT DETECTED NOT DETECTED Final   Influenza A NOT DETECTED NOT DETECTED Final   Influenza B NOT DETECTED NOT DETECTED Final   Parainfluenza Virus 1 NOT DETECTED NOT DETECTED Final   Parainfluenza Virus 2  NOT DETECTED NOT DETECTED Final   Parainfluenza Virus 3 NOT DETECTED NOT DETECTED Final   Parainfluenza Virus 4 NOT DETECTED NOT DETECTED Final   Respiratory Syncytial Virus NOT DETECTED NOT DETECTED Final   Bordetella pertussis NOT DETECTED NOT DETECTED Final   Chlamydophila pneumoniae NOT DETECTED NOT DETECTED Final   Mycoplasma pneumoniae NOT DETECTED NOT DETECTED Final    Comment: Performed at [facility], [place] [address], [place], [place] [address]  Culture, Urine     Status: None   Collection Time: 08/04/19  3:08 PM   Specimen: Urine, Random  Result Value Ref Range Status   Specimen Description   Final    URINE, RANDOM Performed at [place] [address], [place], [place] [address]    Special Requests   Final    NONE Performed at [facility], [place] [address], [place], [place] [address]    Culture   Final    NO GROWTH Performed at [facility], [place] [address] [place], [place] [address]    Report Status 08/05/2019 FINAL  Final  Culture, Urine     Status: None   Collection Time: 08/07/19  4:36 PM   Specimen: Urine, Clean Catch  Result Value Ref Range Status   Specimen Description   Final    URINE, CLEAN CATCH Performed at [facility], [place] [address] [place], [place] [address]    Special Requests   Final    NONE Performed at [facility], [place] [address] [place], [place] [address]    Culture   Final     NO GROWTH Performed at [facility], [place] [address] [place], [place] [address]    Report Status 08/09/2019 FINAL  Final  Culture, blood (Routine X 2) w Reflex to ID Panel     Status: None (Preliminary result)   Collection Time: 08/10/19  9:44 AM   Specimen: BLOOD  Result Value Ref Range Status   Specimen Description   Final    BLOOD LEFT ANTECUBITAL Performed at [place] [address], [place], [place] [address]    Special Requests   Final    BOTTLES DRAWN AEROBIC AND ANAEROBIC Blood Culture adequate volume Performed at [place] [address] [place], [place] [address]    Culture   Final    NO GROWTH < 24 HOURS Performed at [place] [address], [place], [place] [address]    Report Status PENDING  Incomplete  Culture, blood (Routine X 2) w Reflex to ID Panel     Status: None (Preliminary result)   Collection Time: 08/10/19  9:44 AM   Specimen: BLOOD  Result Value Ref Range Status   Specimen Description   Final    BLOOD RIGHT ANTECUBITAL Performed at [place] [address], [place], [place] [address]    Special Requests   Final    BOTTLES DRAWN AEROBIC AND ANAEROBIC Blood Culture adequate volume Performed at [place] [address], [place], [place] [address]    Culture   Final    NO GROWTH < 24 HOURS Performed at [place] [address], [place],  [address]    Report Status PENDING  Incomplete  Culture, Urine     Status: None   Collection Time: 08/10/19  9:59 AM   Specimen: Urine, Catheterized  Result Value Ref Range Status  Specimen Description   Final    URINE, CATHETERIZED Performed at [facility], [place] [address] [place], [place] [address]    Special Requests   Final    NONE Performed at [facility], [place] [address], [place], [place] [address]    Culture   Final    NO GROWTH Performed  at [facility], [place] [address], [place], [place] [address]    Report Status 08/11/2019 FINAL  Final         Radiology Studies: US RENAL  Result Date: 08/10/2019 CLINICAL DATA:  Acute renal failure EXAM: RENAL / URINARY TRACT ULTRASOUND COMPLETE COMPARISON:  Ultrasound abdomen 08/04/2019 FINDINGS: Right Kidney: Renal measurements: 10.2 x 5.2 x 6.8 cm = volume: 190 mL. Negative for renal obstruction. Several small renal cysts are present largest measuring 1.5 cm. Normal cortical echogenicity. Left Kidney: Renal measurements: 9.8 x 4.9 x 4.8 cm = volume: 121 mL. Echogenicity within normal limits. No mass or hydronephrosis visualized. Bladder: Normal bladder. The bladder is full with a Foley catheter inflated in the bladder. Other: None. IMPRESSION: No renal obstruction.  Small right renal cyst noted. Bladder is full with a Foley catheter in place. Electronically Signed   By: Franchot Gallo M.D.   On: 08/10/2019 14:56        Scheduled Meds: . amLODipine  10 mg Oral Daily  . vitamin C  500 mg Oral Daily  . aspirin  81 mg Oral Daily  . atorvastatin  40 mg Oral QPM  . dexamethasone (DECADRON) injection  6 mg Intravenous Daily  . heparin  5,000 Units Subcutaneous Q8H  . hydrALAZINE  10 mg Intravenous Once  . insulin aspart  0-5 Units Subcutaneous QHS  . insulin aspart  0-6 Units Subcutaneous TID WC  . insulin glargine  30 Units Subcutaneous Daily  . Ipratropium-Albuterol  1 puff Inhalation TID  . linagliptin  5 mg Oral Daily  . zinc sulfate  220 mg Oral Daily   Continuous Infusions: . dextrose 100 mL/hr at 08/11/19 0517  . famotidine (PEPCID) IV 20 mg (08/11/19 1122)     LOS: 7 days     Georgette Shell, MD  08/11/2019, 1:19 PM

## 2019-08-11 NOTE — Progress Notes (Signed)
At start of shift, patient found up and walking towards bathroom. Redirected and placed back in bed. Pt continues to state that he needs the bathroom. Bladder scanned and 53ml found and foley producing clear urine. Called patient wife via facetime and patient responded minimally to her and continued to bite mittens and try to get OOB while on phone with wife. Ativan given at 2038 and patient continued to bite tele wires, IV tubing, attempted to bite me, hit me while doing scheduled injections and pulling on foley. Foley insertion site irritated and bloody. Foley care completed and placed back in leg strap. Multiple attempts to redirect patient (by myself, CNA and tele-sitter and pt not redirectable. Covering Provider notified of the above at 2200 and soft wrist restraints and roll belt ordered for patient safety. Charge RN made aware. Will discontinue when patient meets discontinuation criteria.

## 2019-08-11 NOTE — Progress Notes (Signed)
Attempted to feed patient lunch - patient did take a few small sips of tea.  Tried food, but patient held teeth together and shook head "no".  Tried to give more tea, but patient just let it run back out of his mouth.  Will continue to encourage PO intake.

## 2019-08-11 NOTE — Progress Notes (Signed)
Patient alert this AM with eyes open but not following commands or instructions.  Patient non-verbal with orientation questions, but when asked what his wife's name was, he did mumble "Stanton Kidney".  When asked if [birth year] was his birthday he said "mmhmm".  Patient mumbled "mmhmm" with a couple of other questions but would not answer with words.  Attempted to feed breakfast, but holds mouth tight and will not open, unable to give any PO meds.  Patient did allow brushing the front of his teeth, but would not open mouth enough to clean inside of mouth well.  Patient able to turn independently  - turned self to R side after taking blood sugar.  CHG bath given and foley care completed - patient did get irritated with foley care and tried to push away. Patient appears comfortable at this time with mitts in place.

## 2019-08-11 NOTE — Progress Notes (Signed)
Inpatient Diabetes Program Recommendations  AACE/ADA: New Consensus Statement on Inpatient Glycemic Control (2015)  Target Ranges:  Prepandial:   less than 140 mg/dL      Peak postprandial:   less than 180 mg/dL (1-2 hours)      Critically ill patients:  140 - 180 mg/dL   Lab Results  Component Value Date   GLUCAP 371 (H) 08/11/2019   HGBA1C 7.5 (H) 08/05/2019    Review of Glycemic Control  Diabetes history: DM2 Outpatient Diabetes medications: NPH 70/30 38 units in am and 36 units in pm Current orders for Inpatient glycemic control: Lantus 30 units QD, Novolog 0-6 units tidwc and HS, tradjenta 5 mg QD  HgbA1C - 7.5% - likely has lows at home. Covid 19+ on Decadron 6 mg QD Creat 3.11 this am, down from 3.45  Inpatient Diabetes Program Recommendations:      Increase Lantus to 35 units QD Add Novolog 3 units tidwc if pt eats > 50% meal May need to increase Novolog correction to 0-9 units tidwc  Follow closely.  Thank you. Lorenda Peck, RD, LDN, CDE Inpatient Diabetes Coordinator [phone]

## 2019-08-11 NOTE — Progress Notes (Signed)
Got a call from tele monitored that pt. was tangle in cords. Nurse enter room to find pt. Wide eyed and " froze like" and shaking.Pt. Was not responded to nurse and not following commands. Pupils were sluggish, BP elevated. Earlier in shift that was able to left arms apporicate for BP and CBG check. And physically turning from side to side in the bed.  Called RRT nurse for second opinion of pt. Ruled out anything neuro. Called covering provider who informed me that pt. Had presented in this state before. PRN order for elevated BP. BP resulted before nurse gave hydralazine. MD said keep eye out for morning lab results. Will continue to monitor.

## 2019-08-11 NOTE — Progress Notes (Signed)
Face timed wife with patient while in room - patient did speak a few words to wife but still continues to not answer questions from staff other than occasionally mumbling "mmhmm"

## 2019-08-11 NOTE — Significant Event (Signed)
Rapid Response Event Note  Overview: Time Called: 0126 Arrival Time: 0135 Event Type: Neurologic  Initial Focused Assessment:   Called to bedside in reference to decreased LOC.  Primary nurse advises that patient had eyes open but was not responding to her.  She described it as the patient froze, which was a marked change from his previous behavior.  Per notes, patient has been confused but more on the restless and agitated side.  Reviewed Hx: diabetes mellitus type 2, chronic kidney disease stage IV, hypertension, CAD status post CABG in 2019, hypertension presented to the ER because of persistent nausea vomiting and diarrhea with the last 1 week.  Pt admitted 7 days ago with a list of Diagnosis one that included confusion / acute metabolic encephalopathy.  Ammonia levels on admission WNL.  BUN and Creatine elevated but has CKD stage 4.  On my assessment:  Patient laying in bed, completely still, not responding to my voice.  I elicited a pain response by pinching trapezius on right side.  Patient immediately grimaced and attempted to remove my hand.  When patient grimacing noticed facial symmetry.  Arm strength was weak but equal on both sides.  On command patient would not move legs but when touched bottom of feet patient moved both equally.  CBG was done, which was elevated @ 329.  VS done which were stable with BP being elevated to 140/109.  Sats 98% on 4L-[place].  No respiratory distress noted.  Do not see anything that would lead me to believe stroke, or any other protocols that can be carried out by rapid response.  I advised primary nurse to call the [facility] NP on call and advise her of the situation and have her thoughts on the matter.  Interventions:  CBG (in process on my arrival), VS (done by primary nurse), Rapid Response nurse assessment  Plan of Care (if not transferred):  Ardith Dark paged by primary nurse.  Ardith Dark ordered med for BP, but advised some previous notes had described  his neuro status in a similar fashion.  Ardith Dark advised she would wait for am labs to be drawn and reassess at that point.  I advised primary nurse, if patient again has a decrease in LOC or she sees any signs of deterioration, to call rapid response back at [phone].   [place] / Care Coordinator / Rapid Response Nurse ICU/SD Unit ([facility]) [place], [facility] Rapid Response Number:  [phone] ICU Charge Nurse Number:  [phone]

## 2019-08-12 LAB — COMPREHENSIVE METABOLIC PANEL
ALT: 102 U/L — ABNORMAL HIGH (ref 0–44)
AST: 61 U/L — ABNORMAL HIGH (ref 15–41)
Albumin: 2.9 g/dL — ABNORMAL LOW (ref 3.5–5.0)
Alkaline Phosphatase: 61 U/L (ref 38–126)
Anion gap: 10 (ref 5–15)
BUN: 84 mg/dL — ABNORMAL HIGH (ref 8–23)
CO2: 26 mmol/L (ref 22–32)
Calcium: 8.5 mg/dL — ABNORMAL LOW (ref 8.9–10.3)
Chloride: 112 mmol/L — ABNORMAL HIGH (ref 98–111)
Creatinine, Ser: 2.37 mg/dL — ABNORMAL HIGH (ref 0.61–1.24)
GFR calc Af Amer: 33 mL/min — ABNORMAL LOW (ref >60)
GFR calc non Af Amer: 28 mL/min — ABNORMAL LOW (ref >60)
Glucose, Bld: 191 mg/dL — ABNORMAL HIGH (ref 70–99)
Potassium: 4.6 mmol/L (ref 3.5–5.1)
Sodium: 148 mmol/L — ABNORMAL HIGH (ref 135–145)
Total Bilirubin: 1.7 mg/dL — ABNORMAL HIGH (ref 0.3–1.2)
Total Protein: 5.9 g/dL — ABNORMAL LOW (ref 6.5–8.1)

## 2019-08-12 LAB — GLUCOSE, CAPILLARY
Glucose-Capillary: 396 mg/dL — ABNORMAL HIGH (ref 70–99)
Glucose-Capillary: 276 mg/dL — ABNORMAL HIGH (ref 70–99)
Glucose-Capillary: 178 mg/dL — ABNORMAL HIGH (ref 70–99)
Glucose-Capillary: 230 mg/dL — ABNORMAL HIGH (ref 70–99)
Glucose-Capillary: 295 mg/dL — ABNORMAL HIGH (ref 70–99)
Glucose-Capillary: 265 mg/dL — ABNORMAL HIGH (ref 70–99)

## 2019-08-12 LAB — MAGNESIUM: Magnesium: 3 mg/dL — ABNORMAL HIGH (ref 1.7–2.4)

## 2019-08-12 MED ORDER — CHLORHEXIDINE GLUCONATE CLOTH 2 % EX PADS
6.0000 | MEDICATED_PAD | Freq: Every day | CUTANEOUS | Status: DC
  Administered 2019-08-12 – 2019-08-16 (×2): 6 via TOPICAL

## 2019-08-12 NOTE — Progress Notes (Signed)
Patients restraints removed at 1400 - patient has been calm and restful, more oriented this afternoon.  Will continue to monitor

## 2019-08-12 NOTE — Progress Notes (Signed)
Patient more alert this morning, saying good morning and answering questions.  Patient disoriented to place and situation - asking "how high are we going?"  When asked what he means he states "in the rocket".  Knows that it is year 2021 and states his correct birthday.  Patient eating and drinking small sips and bites.

## 2019-08-12 NOTE — Progress Notes (Signed)
Pt with short burst of SVT(rate 152bpm) and then converted back to NSR. Pt asymptomatic, other vitals remain stable. Provider made aware.

## 2019-08-12 NOTE — Progress Notes (Addendum)
PROGRESS NOTE    Lucas Hobbs  HDQ:[id number] DOB: 27-Mar-1956 DOA: 08/03/2019 PCP: Jiles Garter, MD  Brief Narrative: HPI per Dr. Jimmy Picket Brownis a 64 y.o.malewithhistory of diabetes mellitus type 2, chronic kidney disease stage IV, hypertension, CAD status post CABG in 2019, hypertension presented to the ER because of persistent nausea vomiting and diarrhea with the last 1 week. Patient states he has been having persistent vomiting with no blood in it and occasional diarrhea. Denied any abdominal pain. Had been a constant hiccups. He still takes insulin. When EMS was called in CBG was around 26. Was given D10 and Zofran. Blood sugar improved to 114.  ED Course:In the ER patient blood sugar again dropped around 60s and lab work show creatinine of 2.9 AST 78 platelets 132 EKG normal sinus rhythm chest x-ray unremarkable. Covid test pending. On exam abdomen appears benign. Patient was started on D5 normal saline and admitted for hypoglycemia and persistent nausea vomiting. With hiccups.  Assessment & Plan:   Principal Problem:   Hypoglycemia Active Problems:   CKD (chronic kidney disease), stage IV (HCC)   Nausea & vomiting   Essential hypertension   DM (diabetes mellitus), type 2 with renal complications (HCC)   CAD (coronary artery disease)   COVID-19   Pneumonia due to COVID-19 virus   #1 acute hypoxic respiratory failure secondary to Covid pneumonia -covid 19 positive-he is admitted with nausea vomiting and diarrhea and hypoglycemia. He is noted to be hypoxic with increasing oxygen requirement 4 L with saturation 94%. S/p remdesvir On decadron Was treated for possible aspiration pneumonia with Unasyn/Augmentin.  This was stopped as his procalcitonin was negative. Chest x-ray from 08/07/2019 multifocal pneumonia. Titrate oxygen  chest x-ray4/7 -improving b/l lung opacities   #2 intractable nausea vomiting and diarrhea likely secondary to Covid resolving  CT of the abdomen and pelvis shows no acute abnormalities.  #3 hypertension-blood pressure 139/80 continue Norvasc.  #4 CAD status post CABG-had elevated troponin likely from demand ischemia secondary to Covid.  Continue statins and Norvasc.  Beta-blocker stopped due to bradycardia.  #5 type 2 diabetes with hyperglycemia-he was hypoglycemic on presentation now with being on steroids his blood sugar has been running high.  Appreciate diabetic coordinator input.  Will increase Lantus to 35 units. CBG (last 3)  Recent Labs    08/11/19 2121 08/12/19 0806 08/12/19 1145  GLUCAP 276* 178* 230*     #6 AKI on CKD stage IV creatinine slowly improving.  Creatinine 2.3 down from 3.45.  Continue IV fluids.  Renal ultrasound shows no hydronephrosis.  However his bladder is distended with Foley catheter in place.  Restarted Flomax.  #7 acute metabolic encephalopathy-likely multifactorial steroids/hypoxia/uremia-ammonia levels normal.  CT head shows no acute changes.  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  He was treated with Risperdal and Haldol which was stopped due to QTC prolongation.  #8 QT prolongation-keep electrolytes normal hold antipsychotics.  Hold Zofran.  Magnesium level high 3.5 trending down.  #9 bradycardia  Resolved  #10 hypermagnesemia-patient is not on any magnesium supplements. mag level today 3.0.   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  Pressure Injury 08/09/19 Buttocks Left Stage 1 -  Intact skin with non-blanchable redness of a localized area usually over a bony prominence. (Active)  08/09/19 2230  Location: Buttocks  Location Orientation: Left  Staging: Stage 1 -  Intact skin with non-blanchable redness of a localized area usually over a bony prominence.  Wound Description (Comments):   Present on Admission:  Estimated body mass index is 23.55 kg/m as calculated from the following:   Height as of this encounter: 5\' 7"  (1.702 m).   Weight as of this encounter: 68.2 kg.   DVT prophylaxis: heparin Code Status: full Family Communication: Discussed with his wife [phone]   [place] who lives in [place] Disposition Plan: Patient came from home Anticipated DC place unknown.  PT evaluation pending.  Patient lived with his mother at home in [place].  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  His wife lives in [place].   Barrier to discharge patient with confusion new onset and persistent hypoxia  Consultants: None   Procedures: None Antimicrobials:  None   Subjective:  Patient is awake today he thinks he is in [place] however he tells me he lives in [place] He asked for water to drink  Objective: Vitals:   08/12/19 0307 08/12/19 0500 08/12/19 0544 08/12/19 1204  BP: 132/87  (!) 160/74 139/80  Pulse:   85 97  Resp:    19  Temp:   98.3 F (36.8 C) 97.6 F (36.4 C)  TempSrc:   Oral Axillary  SpO2:   94% 93%  Weight:  68.2 kg    Height:        Intake/Output Summary (Last 24 hours) at 08/12/2019 1425 Last data filed at 08/12/2019 1201 Gross per 24 hour  Intake 3479.03 ml  Output 2075 ml  Net 1404.03 ml   Filed Weights   08/08/19 0322 08/10/19 0331 08/12/19 0500  Weight: 75.4 kg 68.6 kg 68.2 kg    Examination:  General exam: Appears calm and comfortable  Respiratory system: Clear to auscultation. Respiratory effort normal. Cardiovascular system: S1 & S2 heard, RRR. No JVD, murmurs, rubs, gallops or clicks. No pedal edema. Gastrointestinal system: Abdomen is nondistended, soft and nontender. No organomegaly or masses felt. Normal bowel sounds heard. Central nervous system: Alert and oriented. No focal neurological deficits. Extremities: Symmetric 5 x 5 power. Skin: No rashes, lesions or ulcers Psychiatry: Judgement and insight appear normal. Mood & affect appropriate.     Data Reviewed: I have personally reviewed following labs and imaging studies  CBC: Recent Labs  Lab 08/07/19 0809 08/08/19 0346 08/09/19  0423 08/10/19 0403 08/11/19 0407  WBC 9.9 10.1 10.9* 14.7* 13.8*  NEUTROABS 8.4* 8.7* 8.9* 12.4* 11.8*  HGB 12.8* 12.6* 14.4 15.3 15.1  HCT 38.5* 37.8* 43.2 47.4 47.7  MCV 86.7 85.3 85.7 87.5 89.0  PLT 157 159 166 204 017   Basic Metabolic Panel: Recent Labs  Lab 08/06/19 0321 08/06/19 0321 08/07/19 0809 08/07/19 0809 08/08/19 0346 08/09/19 0423 08/10/19 0403 08/11/19 0407 08/12/19 0424  NA 144   < > 146*   < > 146* 146* 148* 147* 148*  K 4.2   < > 4.2   < > 3.6 4.3 4.3 4.4 4.6  CL 109   < > 114*   < > 111 111 111 108 112*  CO2 19*   < > 24   < > 23 24 24 28 26   GLUCOSE 318*   < > 203*   < > 148* 256* 320* 387* 191*  BUN 73*   < > 79*   < > 71* 78* 100* 110* 84*  CREATININE 2.53*   < > 2.22*   < > 2.23* 2.19* 3.45* 3.11* 2.37*  CALCIUM 8.1*   < > 8.0*   < > 8.4* 8.8* 8.8* 8.8* 8.5*  MG 2.7*   < >  2.8*   < > 2.6* 3.0* 3.5* 3.5* 3.0*  PHOS 3.7  --  3.7  --  3.6 4.5  --  5.8*  --    < > = values in this interval not displayed.   GFR: Estimated Creatinine Clearance: 29.8 mL/min (A) (by C-G formula based on SCr of 2.37 mg/dL (H)). Liver Function Tests: Recent Labs  Lab 08/07/19 0809 08/07/19 0809 08/08/19 0346 08/09/19 0423 08/10/19 0403 08/11/19 0407 08/12/19 0424  AST 89*  --  168* 209* 118*  --  61*  ALT 46*  --  74* 114* 120*  --  102*  ALKPHOS 48  --  60 67 68  --  61  BILITOT 1.0  --  1.1 1.3* 0.9  --  1.7*  PROT 5.2*  --  5.3* 5.9* 6.3*  --  5.9*  ALBUMIN 2.7*   < > 3.0* 3.3* 3.3* 3.2* 2.9*   < > = values in this interval not displayed.   No results for input(s): LIPASE, AMYLASE in the last 168 hours. Recent Labs  Lab 08/06/19 1722 08/09/19 1107  AMMONIA 14 25   Coagulation Profile: No results for input(s): INR, PROTIME in the last 168 hours. Cardiac Enzymes: No results for input(s): CKTOTAL, CKMB, CKMBINDEX, TROPONINI in the last 168 hours. BNP (last 3 results) No results for input(s): PROBNP in the last 8760 hours. HbA1C: No results for  input(s): HGBA1C in the last 72 hours. CBG: Recent Labs  Lab 08/11/19 1116 08/11/19 1608 08/11/19 2121 08/12/19 0806 08/12/19 1145  GLUCAP 361* 396* 276* 178* 230*   Lipid Profile: No results for input(s): CHOL, HDL, LDLCALC, TRIG, CHOLHDL, LDLDIRECT in the last 72 hours. Thyroid Function Tests: No results for input(s): TSH, T4TOTAL, FREET4, T3FREE, THYROIDAB in the last 72 hours. Anemia Panel: Recent Labs    08/10/19 0403  FERRITIN 940*   Sepsis Labs: Recent Labs  Lab 08/07/19 0809 08/08/19 0346 08/09/19 0423  PROCALCITON <0.10 <0.10 0.10    Recent Results (from the past 240 hour(s))  SARS CORONAVIRUS 2 (TAT 6-24 HRS) Nasopharyngeal Nasopharyngeal Swab     Status: Abnormal   Collection Time: 08/04/19 12:53 AM   Specimen: Nasopharyngeal Swab  Result Value Ref Range Status   SARS Coronavirus 2 POSITIVE (A) NEGATIVE Final    Comment: RESULT CALLED TO, READ BACK BY AND VERIFIED WITH: Ivin Poot [id number] 08/04/2019 T. TYSOR (NOTE) SARS-CoV-2 target nucleic acids are DETECTED. The SARS-CoV-2 RNA is generally detectable in upper and lower respiratory specimens during the acute phase of infection. Positive results are indicative of the presence of SARS-CoV-2 RNA. Clinical correlation with patient history and other diagnostic information is  necessary to determine patient infection status. Positive results do not rule out bacterial infection or co-infection with other viruses.  The expected result is Negative. Fact Sheet for Patients: SugarRoll.be Fact Sheet for Healthcare Providers: https://www.woods-.com/ This test is not yet approved or cleared by the [place] FDA and  has been authorized for detection and/or diagnosis of SARS-CoV-2 by FDA under an Emergency Use Authorization (EUA). This EUA will remain  in effect (meaning this test can be used) for th e duration of the COVID-19 declaration under Section 564(b)(1) of  the Act, 21 U.S.C. section 360bbb-3(b)(1), unless the authorization is terminated or revoked sooner. Performed at [facility], [place] [address], [place], [place] [address]   Respiratory Panel by PCR     Status: None   Collection Time: 08/04/19 12:53 AM   Specimen: Nasopharyngeal  Swab; Respiratory  Result Value Ref Range Status   Adenovirus NOT DETECTED NOT DETECTED Final   Coronavirus 229E NOT DETECTED NOT DETECTED Final    Comment: (NOTE) The Coronavirus on the Respiratory Panel, DOES NOT test for the novel  Coronavirus (2019 nCoV)    Coronavirus HKU1 NOT DETECTED NOT DETECTED Final   Coronavirus NL63 NOT DETECTED NOT DETECTED Final   Coronavirus OC43 NOT DETECTED NOT DETECTED Final   Metapneumovirus NOT DETECTED NOT DETECTED Final   Rhinovirus / Enterovirus NOT DETECTED NOT DETECTED Final   Influenza A NOT DETECTED NOT DETECTED Final   Influenza B NOT DETECTED NOT DETECTED Final   Parainfluenza Virus 1 NOT DETECTED NOT DETECTED Final   Parainfluenza Virus 2 NOT DETECTED NOT DETECTED Final   Parainfluenza Virus 3 NOT DETECTED NOT DETECTED Final   Parainfluenza Virus 4 NOT DETECTED NOT DETECTED Final   Respiratory Syncytial Virus NOT DETECTED NOT DETECTED Final   Bordetella pertussis NOT DETECTED NOT DETECTED Final   Chlamydophila pneumoniae NOT DETECTED NOT DETECTED Final   Mycoplasma pneumoniae NOT DETECTED NOT DETECTED Final    Comment: Performed at [facility], [place] [address], [place], [place] [address]  Culture, Urine     Status: None   Collection Time: 08/04/19  3:08 PM   Specimen: Urine, Random  Result Value Ref Range Status   Specimen Description   Final    URINE, RANDOM Performed at [facility] [address], [place], [place] [address]    Special Requests   Final    NONE Performed at [facility], [place] [address] [place], [place] [address]    Culture   Final    NO GROWTH Performed at [facility],  [place] [address], [place], [place] [address]    Report Status 08/05/2019 FINAL  Final  Culture, Urine     Status: None   Collection Time: 08/07/19  4:36 PM   Specimen: Urine, Clean Catch  Result Value Ref Range Status   Specimen Description   Final    URINE, CLEAN CATCH Performed at [facility], [place] [address], [place], [place] [address]    Special Requests   Final    NONE Performed at [facility], [place] [address] [place], [place] [address]    Culture   Final    NO GROWTH Performed at [facility], [place] [address], [place], [place] [address]    Report Status 08/09/2019 FINAL  Final  Culture, blood (Routine X 2) w Reflex to ID Panel     Status: None (Preliminary result)   Collection Time: 08/10/19  9:44 AM   Specimen: BLOOD  Result Value Ref Range Status   Specimen Description   Final    BLOOD LEFT ANTECUBITAL Performed at [facility] [address] [place], [place] [address]    Special Requests   Final    BOTTLES DRAWN AEROBIC AND ANAEROBIC Blood Culture adequate volume Performed at [facility] [address] [place], [place] [address]    Culture   Final    NO GROWTH 2 DAYS Performed at [facility] [address] [place], [place] [address]    Report Status PENDING  Incomplete  Culture, blood (Routine X 2) w Reflex to ID Panel     Status: None (Preliminary result)   Collection Time: 08/10/19  9:44 AM   Specimen: BLOOD  Result Value Ref Range Status  Specimen Description   Final    BLOOD RIGHT ANTECUBITAL Performed at [place] [address], [place], [place] [address]    Special Requests   Final    BOTTLES DRAWN AEROBIC AND ANAEROBIC Blood Culture adequate volume Performed at [facility] [address], [place], [place] [address]    Culture   Final    NO GROWTH 2 DAYS Performed at [facility] [address]  [place], [place] [address]    Report Status PENDING  Incomplete  Culture, Urine     Status: None   Collection Time: 08/10/19  9:59 AM   Specimen: Urine, Catheterized  Result Value Ref Range Status   Specimen Description   Final    URINE, CATHETERIZED Performed at [place] [address], [place], [place] [address]    Special Requests   Final    NONE Performed at [facility], [place] [address], [place], [place] [address]    Culture   Final    NO GROWTH Performed at [facility], [place] [address], [place],  [address]    Report Status 08/11/2019 FINAL  Final         Radiology Studies: DG Chest 1 View  Result Date: 08/11/2019 CLINICAL DATA:  COVID-19 positive, hypoxemia. EXAM: CHEST  1 VIEW COMPARISON:  August 07, 2019. FINDINGS: Stable cardiomediastinal silhouette. Sternotomy wires are noted. No pneumothorax or pleural effusion is noted. Bilateral lung opacities are decreased compared to prior exam suggesting improving multifocal pneumonia. Bony thorax is unremarkable. IMPRESSION: Improving bilateral lung opacities are noted suggesting improving multifocal pneumonia. Electronically Signed   By: Marijo Conception M.D.   On: 08/11/2019 15:57   US RENAL  Result Date: 08/10/2019 CLINICAL DATA:  Acute renal failure EXAM: RENAL / URINARY TRACT ULTRASOUND COMPLETE COMPARISON:  Ultrasound abdomen 08/04/2019 FINDINGS: Right Kidney: Renal measurements: 10.2 x 5.2 x 6.8 cm = volume: 190 mL. Negative for renal obstruction. Several small renal cysts are present largest measuring 1.5 cm. Normal cortical echogenicity. Left Kidney: Renal measurements: 9.8 x 4.9 x 4.8 cm = volume: 121 mL. Echogenicity within normal limits. No mass or hydronephrosis visualized. Bladder: Normal bladder. The bladder is full with a Foley catheter inflated in the bladder. Other: None. IMPRESSION: No renal obstruction.  Small right renal cyst noted. Bladder is full with a Foley catheter  in place. Electronically Signed   By: Franchot Gallo M.D.   On: 08/10/2019 14:56        Scheduled Meds: . amLODipine  10 mg Oral Daily  . vitamin C  500 mg Oral Daily  . aspirin  81 mg Oral Daily  . atorvastatin  40 mg Oral QPM  . Chlorhexidine Gluconate Cloth  6 each Topical Daily  . dexamethasone (DECADRON) injection  6 mg Intravenous Daily  . heparin  5,000 Units Subcutaneous Q8H  . hydrALAZINE  10 mg Intravenous Once  . insulin aspart  0-5 Units Subcutaneous QHS  . insulin aspart  0-6 Units Subcutaneous TID WC  . insulin glargine  35 Units Subcutaneous Daily  . Ipratropium-Albuterol  1 puff Inhalation TID  . linagliptin  5 mg Oral Daily  . tamsulosin  0.4 mg Oral Daily  . zinc sulfate  220 mg Oral Daily   Continuous Infusions: . sodium chloride 100 mL/hr at 08/12/19 1149  . famotidine (PEPCID) IV 20 mg (08/12/19 1149)     LOS: 8 days     Benjamine Mola  Orlie Pollen, MD 08/12/2019, 2:25 PM

## 2019-08-12 NOTE — Plan of Care (Signed)
  Problem: Clinical Measurements: Goal: Respiratory complications will improve Outcome: Completed/Met  Patient weaned to RA - sats in 90s on RA

## 2019-08-13 LAB — BASIC METABOLIC PANEL
Anion gap: 10 (ref 5–15)
BUN: 59 mg/dL — ABNORMAL HIGH (ref 8–23)
CO2: 25 mmol/L (ref 22–32)
Calcium: 8.2 mg/dL — ABNORMAL LOW (ref 8.9–10.3)
Chloride: 114 mmol/L — ABNORMAL HIGH (ref 98–111)
Creatinine, Ser: 1.91 mg/dL — ABNORMAL HIGH (ref 0.61–1.24)
GFR calc Af Amer: 42 mL/min — ABNORMAL LOW (ref >60)
GFR calc non Af Amer: 36 mL/min — ABNORMAL LOW (ref >60)
Glucose, Bld: 164 mg/dL — ABNORMAL HIGH (ref 70–99)
Potassium: 4 mmol/L (ref 3.5–5.1)
Sodium: 149 mmol/L — ABNORMAL HIGH (ref 135–145)

## 2019-08-13 LAB — GLUCOSE, CAPILLARY
Glucose-Capillary: 148 mg/dL — ABNORMAL HIGH (ref 70–99)
Glucose-Capillary: 225 mg/dL — ABNORMAL HIGH (ref 70–99)

## 2019-08-13 MED ORDER — ONDANSETRON HCL 4 MG/2ML IJ SOLN
4.0000 mg | Freq: Four times a day (QID) | INTRAMUSCULAR | Status: DC | PRN
  Administered 2019-08-13 – 2019-08-14 (×2): 4 mg via INTRAVENOUS
  Filled 2019-08-13 (×2): qty 2

## 2019-08-13 MED ORDER — BISACODYL 5 MG PO TBEC
10.0000 mg | DELAYED_RELEASE_TABLET | Freq: Once | ORAL | Status: AC
  Administered 2019-08-13: 10 mg via ORAL
  Filled 2019-08-13: qty 2

## 2019-08-13 NOTE — Progress Notes (Addendum)
PROGRESS NOTE    Lucas Hobbs  TIR:[id number] DOB: [birth year] DOA: 08/03/2019 PCP: Jiles Garter, MD  Brief Narrative:63 y.o.malewithhistory of diabetes mellitus type 2, chronic kidney disease stage IV, hypertension, CAD status post CABG in 2019, hypertension presented to the ER because of persistent nausea vomiting and diarrhea with the last 1 week. Patient states he has been having persistent vomiting with no blood in it and occasional diarrhea. Denied any abdominal pain. Had been a constant hiccups. He still takes insulin. When EMS was called in CBG was around 21. Was given D10 and Zofran. Blood sugar improved to 114.  ED Course:In the ER patient blood sugar again dropped around 60s and lab work show creatinine of 2.9 AST 78 platelets 132 EKG normal sinus rhythm chest x-ray unremarkable. Covid test pending. On exam abdomen appears benign. Patient was started on D5 normal saline and admitted for hypoglycemia and persistent nausea vomiting. With hiccups.   Assessment & Plan:   Principal Problem:   Hypoglycemia Active Problems:   CKD (chronic kidney disease), stage IV (HCC)   Nausea & vomiting   Essential hypertension   DM (diabetes mellitus), type 2 with renal complications (HCC)   CAD (coronary artery disease)   COVID-19   Pneumonia due to COVID-19 virus     Pressure Injury 08/09/19 Buttocks Left Stage 1 -  Intact skin with non-blanchable redness of a localized area usually over a bony prominence. (Active)  08/09/19 2230  Location: Buttocks  Location Orientation: Left  Staging: Stage 1 -  Intact skin with non-blanchable redness of a localized area usually over a bony prominence.  Wound Description (Comments):   Present on Admission:    #1acute hypoxic respiratory failure secondary to Covid pneumonia-covid 19 positive-he is admitted with nausea vomiting and diarrhea and hypoglycemia. He is noted to be hypoxic with increasing oxygen requirement 4 L with  saturation 87%.  S/p remdesvir On decadron Was treated for possible aspiration pneumonia with Unasyn/Augmentin. This was stopped as his procalcitonin was negative. Chest x-ray from 08/07/2019 multifocal pneumonia. Titrate oxygen  chest x-ray4/7 -improving b/l lung opacities   #2 intractable nausea vomiting and diarrhea likely secondary to Covid resolving CT of the abdomen and pelvis shows no acute abnormalities.  #3 hypertension-blood pressure 139/80 continue Norvasc.  #4 CAD status post CABG-had elevated troponin likely from demand ischemia secondary to Covid. Continue statins and Norvasc. Beta-blocker stopped due to bradycardia.  #5 type 2 diabetes with hyperglycemia-he was hypoglycemic on presentation now with being on steroids his blood sugar has been running high. Appreciate diabetic coordinator input. Will increase Lantus to 35 units.  #6 AKI on CKD stage IV creatinine slowly improving.  Creatinine 1.9 down from 3.45.  Continue IV fluids. Renal ultrasound shows no hydronephrosis. However his bladder is distended with Foley catheter in place. Restarted Flomax.  #7 acute metabolic encephalopathy-likely multifactorial steroids/hypoxia/uremia-ammonia levels normal. CT head shows no acute changes. 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 He was treated with Risperdal and Haldol which was stopped due to QTC prolongation. Discussed with his wife.  Patient's baseline is normal mental status he walked from home and he took care of his mother at home.  Unfortunately he still remains confused.  #8 QT prolongation-keep electrolytes normal hold antipsychotics. Hold Zofran. Magnesium level high 3.5 trending down.  #9 bradycardia Resolved  #10 hypermagnesemia-patient is not on any magnesium supplements. mag level today 3.0.  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    Estimated body mass index is 23.58 kg/m as calculated from the  following:   Height as of this encounter: 5\' 7"  (1.702 m).    Weight as of this encounter: 68.3 kg.  DVT prophylaxis:heparin Code Status:full Family Communication: Discussed with his wife [address] who lives in [place] came from home Anticipated DC place unknown.  PT evaluation pending.  Patient lived with his mother at home in [place].  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  His wife lives in [place].   Barrier to discharge patient with confusion new onset and persistent hypoxia  Consultants:None   Procedures:None Antimicrobials: None  Subjective: Patient more awake than yesterday however still confused and slow to respond  Objective: Vitals:   08/13/19 0500 08/13/19 0520 08/13/19 0901 08/13/19 1128  BP:  (!) 178/86 (!) 123/53 (!) 173/71  Pulse:   68 72  Resp:    20  Temp:    97.7 F (36.5 C)  TempSrc:    Oral  SpO2:    95%  Weight: 68.3 kg     Height:        Intake/Output Summary (Last 24 hours) at 08/13/2019 1454 Last data filed at 08/13/2019 1448 Gross per 24 hour  Intake 2941.76 ml  Output 1475 ml  Net 1466.76 ml   Filed Weights   08/10/19 0331 08/12/19 0500 08/13/19 0500  Weight: 68.6 kg 68.2 kg 68.3 kg    Examination:  General exam: Appears calm and comfortable  Respiratory system: Clear to auscultation. Respiratory effort normal. Cardiovascular system: S1 & S2 heard, RRR. No JVD, murmurs, rubs, gallops or clicks. No pedal edema. Gastrointestinal system: Abdomen is nondistended, soft and nontender. No organomegaly or masses felt. Normal bowel sounds heard. Central nervous system: Alert and oriented. No focal neurological deficits. Extremities: Symmetric 5 x 5 power. Skin: No rashes, lesions or ulcers Psychiatry: Judgement and insight appear normal. Mood & affect appropriate.     Data Reviewed: I have personally reviewed following labs and imaging studies  CBC: Recent Labs  Lab 08/07/19 0809 08/08/19 0346 08/09/19 0423 08/10/19 0403 08/11/19 0407  WBC  9.9 10.1 10.9* 14.7* 13.8*  NEUTROABS 8.4* 8.7* 8.9* 12.4* 11.8*  HGB 12.8* 12.6* 14.4 15.3 15.1  HCT 38.5* 37.8* 43.2 47.4 47.7  MCV 86.7 85.3 85.7 87.5 89.0  PLT 157 159 166 204 702   Basic Metabolic Panel: Recent Labs  Lab 08/07/19 0809 08/07/19 0809 08/08/19 0346 08/08/19 0346 08/09/19 0423 08/10/19 0403 08/11/19 0407 08/12/19 0424 08/13/19 0714  NA 146*   < > 146*   < > 146* 148* 147* 148* 149*  K 4.2   < > 3.6   < > 4.3 4.3 4.4 4.6 4.0  CL 114*   < > 111   < > 111 111 108 112* 114*  CO2 24   < > 23   < > 24 24 28 26 25   GLUCOSE 203*   < > 148*   < > 256* 320* 387* 191* 164*  BUN 79*   < > 71*   < > 78* 100* 110* 84* 59*  CREATININE 2.22*   < > 2.23*   < > 2.19* 3.45* 3.11* 2.37* 1.91*  CALCIUM 8.0*   < > 8.4*   < > 8.8* 8.8* 8.8* 8.5* 8.2*  MG 2.8*   < > 2.6*  --  3.0* 3.5* 3.5* 3.0*  --   PHOS 3.7  --  3.6  --  4.5  --  5.8*  --   --    < > =  values in this interval not displayed.   GFR: Estimated Creatinine Clearance: 37 mL/min (A) (by C-G formula based on SCr of 1.91 mg/dL (H)). Liver Function Tests: Recent Labs  Lab 08/07/19 0809 08/07/19 0809 08/08/19 0346 08/09/19 0423 08/10/19 0403 08/11/19 0407 08/12/19 0424  AST 89*  --  168* 209* 118*  --  61*  ALT 46*  --  74* 114* 120*  --  102*  ALKPHOS 48  --  60 67 68  --  61  BILITOT 1.0  --  1.1 1.3* 0.9  --  1.7*  PROT 5.2*  --  5.3* 5.9* 6.3*  --  5.9*  ALBUMIN 2.7*   < > 3.0* 3.3* 3.3* 3.2* 2.9*   < > = values in this interval not displayed.   No results for input(s): LIPASE, AMYLASE in the last 168 hours. Recent Labs  Lab 08/06/19 1722 08/09/19 1107  AMMONIA 14 25   Coagulation Profile: No results for input(s): INR, PROTIME in the last 168 hours. Cardiac Enzymes: No results for input(s): CKTOTAL, CKMB, CKMBINDEX, TROPONINI in the last 168 hours. BNP (last 3 results) No results for input(s): PROBNP in the last 8760 hours. HbA1C: No results for input(s): HGBA1C in the last 72  hours. CBG: Recent Labs  Lab 08/12/19 0806 08/12/19 1145 08/12/19 1632 08/12/19 2124 08/13/19 0806  GLUCAP 178* 230* 295* 265* 148*   Lipid Profile: No results for input(s): CHOL, HDL, LDLCALC, TRIG, CHOLHDL, LDLDIRECT in the last 72 hours. Thyroid Function Tests: No results for input(s): TSH, T4TOTAL, FREET4, T3FREE, THYROIDAB in the last 72 hours. Anemia Panel: No results for input(s): VITAMINB12, FOLATE, FERRITIN, TIBC, IRON, RETICCTPCT in the last 72 hours. Sepsis Labs: Recent Labs  Lab 08/07/19 0809 08/08/19 0346 08/09/19 0423  PROCALCITON <0.10 <0.10 0.10    Recent Results (from the past 240 hour(s))  SARS CORONAVIRUS 2 (TAT 6-24 HRS) Nasopharyngeal Nasopharyngeal Swab     Status: Abnormal   Collection Time: 08/04/19 12:53 AM   Specimen: Nasopharyngeal Swab  Result Value Ref Range Status   SARS Coronavirus 2 POSITIVE (A) NEGATIVE Final    Comment: RESULT CALLED TO, READ BACK BY AND VERIFIED WITH: Ivin Poot [id number] 08/04/2019 T. TYSOR (NOTE) SARS-CoV-2 target nucleic acids are DETECTED. The SARS-CoV-2 RNA is generally detectable in upper and lower respiratory specimens during the acute phase of infection. Positive results are indicative of the presence of SARS-CoV-2 RNA. Clinical correlation with patient history and other diagnostic information is  necessary to determine patient infection status. Positive results do not rule out bacterial infection or co-infection with other viruses.  The expected result is Negative. Fact Sheet for Patients: SugarRoll.be Fact Sheet for Healthcare Providers: https://www.woods-.com/ This test is not yet approved or cleared by the [place] FDA and  has been authorized for detection and/or diagnosis of SARS-CoV-2 by FDA under an Emergency Use Authorization (EUA). This EUA will remain  in effect (meaning this test can be used) for th e duration of the COVID-19 declaration under  Section 564(b)(1) of the Act, 21 U.S.C. section 360bbb-3(b)(1), unless the authorization is terminated or revoked sooner. Performed at [facility], [place] [address] [place], [place] [address]   Respiratory Panel by PCR     Status: None   Collection Time: 08/04/19 12:53 AM   Specimen: Nasopharyngeal Swab; Respiratory  Result Value Ref Range Status   Adenovirus NOT DETECTED NOT DETECTED Final   Coronavirus 229E NOT DETECTED NOT DETECTED Final    Comment: (NOTE) The  Coronavirus on the Respiratory Panel, DOES NOT test for the novel  Coronavirus (2019 nCoV)    Coronavirus HKU1 NOT DETECTED NOT DETECTED Final   Coronavirus NL63 NOT DETECTED NOT DETECTED Final   Coronavirus OC43 NOT DETECTED NOT DETECTED Final   Metapneumovirus NOT DETECTED NOT DETECTED Final   Rhinovirus / Enterovirus NOT DETECTED NOT DETECTED Final   Influenza A NOT DETECTED NOT DETECTED Final   Influenza B NOT DETECTED NOT DETECTED Final   Parainfluenza Virus 1 NOT DETECTED NOT DETECTED Final   Parainfluenza Virus 2 NOT DETECTED NOT DETECTED Final   Parainfluenza Virus 3 NOT DETECTED NOT DETECTED Final   Parainfluenza Virus 4 NOT DETECTED NOT DETECTED Final   Respiratory Syncytial Virus NOT DETECTED NOT DETECTED Final   Bordetella pertussis NOT DETECTED NOT DETECTED Final   Chlamydophila pneumoniae NOT DETECTED NOT DETECTED Final   Mycoplasma pneumoniae NOT DETECTED NOT DETECTED Final    Comment: Performed at [facility], [place] [address], [place], [place] [address]  Culture, Urine     Status: None   Collection Time: 08/04/19  3:08 PM   Specimen: Urine, Random  Result Value Ref Range Status   Specimen Description   Final    URINE, RANDOM Performed at [place] [address] [place], [place] [address]    Special Requests   Final    NONE Performed at [facility], [place] [address] [place], [place] [address]    Culture   Final    NO GROWTH Performed at [facility], [place] [address] [place], [place] [address]    Report Status 08/05/2019 FINAL  Final  Culture, Urine     Status: None   Collection Time: 08/07/19  4:36 PM   Specimen: Urine, Clean Catch  Result Value Ref Range Status   Specimen Description   Final    URINE, CLEAN CATCH Performed at [facility], [place] [address] [place], [place] [address]    Special Requests   Final    NONE Performed at [facility], [place] [address], [place], [place] [address]    Culture   Final    NO GROWTH Performed at [facility], [place] [address], [place], [place] [address]    Report Status 08/09/2019 FINAL  Final  Culture, blood (Routine X 2) w Reflex to ID Panel     Status: None (Preliminary result)   Collection Time: 08/10/19  9:44 AM   Specimen: BLOOD  Result Value Ref Range Status   Specimen Description   Final    BLOOD LEFT ANTECUBITAL Performed at [place] [address] [place], [place] [address]    Special Requests   Final    BOTTLES DRAWN AEROBIC AND ANAEROBIC Blood Culture adequate volume Performed at [place] [address] [place], [place] [address]    Culture   Final    NO GROWTH 3 DAYS Performed at [facility], [place] [address], [place], [place] [address]    Report Status PENDING  Incomplete  Culture, blood (Routine X 2) w Reflex to ID Panel     Status: None (Preliminary result)   Collection Time: 08/10/19  9:44 AM   Specimen: BLOOD  Result Value Ref Range Status   Specimen Description   Final    BLOOD RIGHT ANTECUBITAL Performed at [place] [address], [place], [place] [address]    Special Requests  Final    BOTTLES DRAWN AEROBIC AND ANAEROBIC Blood Culture adequate volume Performed at [place] [address] [place], [place] [address]    Culture   Final    NO GROWTH 3 DAYS Performed at [facility], [place]  [address], [place], [place] [address]    Report Status PENDING  Incomplete  Culture, Urine     Status: None   Collection Time: 08/10/19  9:59 AM   Specimen: Urine, Catheterized  Result Value Ref Range Status   Specimen Description   Final    URINE, CATHETERIZED Performed at [place] [address], [place], [place] [address]    Special Requests   Final    NONE Performed at [facility], [place] [address], [place], [place] [address]    Culture   Final    NO GROWTH Performed at [facility], [place] [address], [place], [place] [address]    Report Status 08/11/2019 FINAL  Final         Radiology Studies: DG Chest 1 View  Result Date: 08/11/2019 CLINICAL DATA:  COVID-19 positive, hypoxemia. EXAM: CHEST  1 VIEW COMPARISON:  August 07, 2019. FINDINGS: Stable cardiomediastinal silhouette. Sternotomy wires are noted. No pneumothorax or pleural effusion is noted. Bilateral lung opacities are decreased compared to prior exam suggesting improving multifocal pneumonia. Bony thorax is unremarkable. IMPRESSION: Improving bilateral lung opacities are noted suggesting improving multifocal pneumonia. Electronically Signed   By: Marijo Conception M.D.   On: 08/11/2019 15:57        Scheduled Meds: . amLODipine  10 mg Oral Daily  . vitamin C  500 mg Oral Daily  . aspirin  81 mg Oral Daily  . atorvastatin  40 mg Oral QPM  . Chlorhexidine Gluconate Cloth  6 each Topical Daily  . heparin  5,000 Units Subcutaneous Q8H  . hydrALAZINE  10 mg Intravenous Once  . insulin aspart  0-5 Units Subcutaneous QHS  . insulin aspart  0-6 Units Subcutaneous TID WC  . insulin glargine  35 Units Subcutaneous Daily  . Ipratropium-Albuterol  1 puff Inhalation TID  . linagliptin  5 mg Oral Daily  . tamsulosin  0.4 mg Oral Daily  . zinc sulfate  220 mg Oral Daily   Continuous Infusions: . famotidine (PEPCID) IV 20 mg (08/13/19 1203)     LOS: 9 days     Georgette Shell, MD 08/13/2019, 2:54 PM

## 2019-08-13 NOTE — Plan of Care (Signed)
Pt A&O x 4 this shift. No negative behavior noted.

## 2019-08-14 ENCOUNTER — Inpatient Hospital Stay (HOSPITAL_COMMUNITY): Payer: 59

## 2019-08-14 DIAGNOSIS — L8990 Pressure ulcer of unspecified site, unspecified stage: Secondary | ICD-10-CM | POA: Insufficient documentation

## 2019-08-14 LAB — GLUCOSE, CAPILLARY
Glucose-Capillary: 142 mg/dL — ABNORMAL HIGH (ref 70–99)
Glucose-Capillary: 222 mg/dL — ABNORMAL HIGH (ref 70–99)
Glucose-Capillary: 146 mg/dL — ABNORMAL HIGH (ref 70–99)
Glucose-Capillary: 139 mg/dL — ABNORMAL HIGH (ref 70–99)
Glucose-Capillary: 147 mg/dL — ABNORMAL HIGH (ref 70–99)
Glucose-Capillary: 87 mg/dL (ref 70–99)

## 2019-08-14 LAB — URINALYSIS, ROUTINE W REFLEX MICROSCOPIC
Bacteria, UA: NONE SEEN
Bilirubin Urine: NEGATIVE
Glucose, UA: >=500 mg/dL — AB
Ketones, ur: NEGATIVE mg/dL
Leukocytes,Ua: NEGATIVE
Nitrite: NEGATIVE
Protein, ur: NEGATIVE mg/dL
Specific Gravity, Urine: 1.016 (ref 1.005–1.030)
pH: 5 (ref 5.0–8.0)

## 2019-08-14 LAB — BASIC METABOLIC PANEL
Anion gap: 6 (ref 5–15)
BUN: 58 mg/dL — ABNORMAL HIGH (ref 8–23)
CO2: 26 mmol/L (ref 22–32)
Calcium: 8.4 mg/dL — ABNORMAL LOW (ref 8.9–10.3)
Chloride: 114 mmol/L — ABNORMAL HIGH (ref 98–111)
Creatinine, Ser: 1.95 mg/dL — ABNORMAL HIGH (ref 0.61–1.24)
GFR calc Af Amer: 41 mL/min — ABNORMAL LOW (ref >60)
GFR calc non Af Amer: 36 mL/min — ABNORMAL LOW (ref >60)
Glucose, Bld: 168 mg/dL — ABNORMAL HIGH (ref 70–99)
Potassium: 4.4 mmol/L (ref 3.5–5.1)
Sodium: 146 mmol/L — ABNORMAL HIGH (ref 135–145)

## 2019-08-14 MED ORDER — BISACODYL 10 MG RE SUPP
10.0000 mg | Freq: Once | RECTAL | Status: AC
  Administered 2019-08-14: 18:00:00 10 mg via RECTAL
  Filled 2019-08-14: qty 1

## 2019-08-14 MED ORDER — TAMSULOSIN HCL 0.4 MG PO CAPS
0.4000 mg | ORAL_CAPSULE | Freq: Once | ORAL | Status: AC
  Administered 2019-08-14: 02:00:00 0.4 mg via ORAL

## 2019-08-14 MED ORDER — LACTULOSE 10 GM/15ML PO SOLN
20.0000 g | Freq: Once | ORAL | Status: AC
  Administered 2019-08-14: 20 g via ORAL

## 2019-08-14 MED ORDER — BISACODYL 5 MG PO TBEC
10.0000 mg | DELAYED_RELEASE_TABLET | Freq: Every day | ORAL | Status: DC
  Administered 2019-08-14 – 2019-08-17 (×4): 10 mg via ORAL
  Filled 2019-08-14 (×4): qty 2

## 2019-08-14 MED ORDER — SORBITOL 70 % SOLN
960.0000 mL | TOPICAL_OIL | Freq: Once | ORAL | Status: AC
  Administered 2019-08-14: 960 mL via RECTAL
  Filled 2019-08-14: qty 473

## 2019-08-14 MED ORDER — SENNA 8.6 MG PO TABS
2.0000 | ORAL_TABLET | Freq: Every day | ORAL | Status: DC
  Administered 2019-08-14 – 2019-08-16 (×3): 17.2 mg via ORAL
  Filled 2019-08-14 (×3): qty 2

## 2019-08-14 MED ORDER — INSULIN GLARGINE 100 UNIT/ML ~~LOC~~ SOLN
15.0000 [IU] | Freq: Every day | SUBCUTANEOUS | Status: DC
  Filled 2019-08-14: qty 0.15

## 2019-08-14 MED ORDER — SODIUM CHLORIDE 0.9 % IV SOLN: INTRAVENOUS | Status: DC

## 2019-08-14 MED ORDER — INSULIN GLARGINE 100 UNIT/ML ~~LOC~~ SOLN: 20.0000 [IU] | Freq: Every day | SUBCUTANEOUS | Status: DC

## 2019-08-14 MED ORDER — PROMETHAZINE HCL 25 MG/ML IJ SOLN: 12.5000 mg | Freq: Four times a day (QID) | INTRAMUSCULAR | Status: DC | PRN

## 2019-08-14 NOTE — Progress Notes (Signed)
PROGRESS NOTE    Lucas Hobbs  JKK:[id number] DOB: [birth year] DOA: 08/03/2019 PCP: Jiles Garter, MD   Brief Narrative:64 y.o.malewithhistory of diabetes mellitus type 2, chronic kidney disease stage IV, hypertension, CAD status post CABG in 2019, hypertension presented to the ER because of persistent nausea vomiting and diarrhea with the last 1 week. Patient states he has been having persistent vomiting with no blood in it and occasional diarrhea. Denied any abdominal pain. Had been a constant hiccups. He still takes insulin. When EMS was called in CBG was around 38. Was given D10 and Zofran. Blood sugar improved to 114.  ED Course:In the ER patient blood sugar again dropped around 60s and lab work show creatinine of 2.9 AST 78 platelets 132 EKG normal sinus rhythm chest x-ray unremarkable. Covid test pending. On exam abdomen appears benign. Patient was started on D5 normal saline and admitted for hypoglycemia and persistent nausea vomiting. With hiccups.   Assessment & Plan:   Principal Problem:   Hypoglycemia Active Problems:   CKD (chronic kidney disease), stage IV (HCC)   Nausea & vomiting   Essential hypertension   DM (diabetes mellitus), type 2 with renal complications (HCC)   CAD (coronary artery disease)   COVID-19   Pneumonia due to COVID-19 virus   Pressure injury of skin  #1acute hypoxic respiratory failure secondary to Covid pneumonia-stable S/p remdesvir On decadron Was treated for possible aspiration pneumonia with Unasyn/Augmentin. This was stopped as his procalcitonin was negative. Chest x-ray from 08/07/2019 multifocal pneumonia. On room air saturating normal chest x-ray4/7 -improving b/l lung opacities  #2 intractable nausea vomiting and diarrhea likely secondary to Covid resolving CT of the abdomen and pelvis shows no acute abnormalities. Patient started with nausea and vomiting again today 08/14/2019.  He was also orthostatic with  lightheadedness.  Night staff reported that he became pale when he stood up last night and they put him back to bed with return of color.  I will start him on IV fluids.  Keep him n.p.o. till his vomiting resolves.  Get KUB and add Phenergan. UA and urine culture today.  #3 hypertension-his blood pressure is low 93/65 hold Norvasc.    #4 CAD status post CABG-had elevated troponin likely from demand ischemia secondary to Covid. Continue statins and Norvasc. Beta-blocker stopped due to bradycardia.  #5 type 2 diabetes with hyperglycemia-he was hypoglycemic on presentation.   CBG (last 3)  Recent Labs    08/13/19 1726 08/14/19 0800 08/14/19 1147  GLUCAP 225* 146* 139*  Decrease Lantus since he is n.p.o. and with nausea and vomiting.  #6 AKI on CKD stage IV creatinine slowly improving. Creatinine 1.9 down from 3.45. Continue IV fluids. Renal ultrasound shows no hydronephrosis.   #7 acute metabolic encephalopathy-patient continues with confusion and delirium.  Likely multifactorial steroids/hypoxia/uremia-ammonia levels normal. CT head shows no acute changes. 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 He was treated with Risperdal and Haldol which was stopped due to QTC prolongation. Discussed with his wife.  Patient's baseline is normal mental status he walked from home and he took care of his mother at home.  Unfortunately he still remains confused.  #8 BPH/urinary retention acute check UA urine culture will place Foley back.  He is retaining urine with bladder scan over 300 cc.  #9 left buttock stage I pressure ulcer continue local care   Pressure Injury 08/09/19 Buttocks Left Stage 1 -  Intact skin with non-blanchable redness of a localized area usually over a bony prominence. (Active)  08/09/19 2230  Location: Buttocks  Location Orientation: Left  Staging: Stage 1 -  Intact skin with non-blanchable redness of a localized area usually over a bony prominence.  Wound Description  (Comments):   Present on Admission:     Estimated body mass index is 23.31 kg/m as calculated from the following:   Height as of this encounter: 5\' 7"  (1.702 m).   Weight as of this encounter: 67.5 kg.  DVT prophylaxis:heparin Code Status:full Family Communication:Discussed with his wife [phone] who lives in [place] came from home Anticipated DC placeunknown. PT evaluation pending. Patient lived with his mother at home in [place]. 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 His wife lives in [place].  Barrier to discharge patient with confusion new onset and persistent hypoxia  Consultants:None   Procedures:None Antimicrobials:None   Subjective: He is resting in bed he is awake confused not sure where he is moves all extremities Orthostatic this morning.  Objective: Vitals:   08/14/19 0549 08/14/19 0937 08/14/19 0938 08/14/19 0940  BP: 136/72 (!) 156/74 114/79 93/65  Pulse: 99 69 100 98  Resp: 20     Temp: 98.3 F (36.8 C)     TempSrc: Oral     SpO2: 93%     Weight:      Height:        Intake/Output Summary (Last 24 hours) at 08/14/2019 1223 Last data filed at 08/14/2019 1212 Gross per 24 hour  Intake 290 ml  Output 900 ml  Net -610 ml   Filed Weights   08/12/19 0500 08/13/19 0500 08/14/19 0516  Weight: 68.2 kg 68.3 kg 67.5 kg    Examination:  General exam: Appears calm and comfortable  Respiratory system: Clear to auscultation. Respiratory effort normal. Cardiovascular system: S1 & S2 heard, RRR. No JVD, murmurs, rubs, gallops or clicks. No pedal edema. Gastrointestinal system: Abdomen is nondistended, soft and nontender. No organomegaly or masses felt. Normal bowel sounds heard. Central nervous system: Alert and oriented. No focal neurological deficits. Extremities: Symmetric 5 x 5 power. Skin: No rashes, lesions or ulcers Psychiatry: Judgement and insight appear normal. Mood & affect appropriate.      Data Reviewed: I have personally reviewed following labs and imaging studies  CBC: Recent Labs  Lab 08/08/19 0346 08/09/19 0423 08/10/19 0403 08/11/19 0407  WBC 10.1 10.9* 14.7* 13.8*  NEUTROABS 8.7* 8.9* 12.4* 11.8*  HGB 12.6* 14.4 15.3 15.1  HCT 37.8* 43.2 47.4 47.7  MCV 85.3 85.7 87.5 89.0  PLT 159 166 204 588   Basic Metabolic Panel: Recent Labs  Lab 08/08/19 0346 08/08/19 0346 08/09/19 0423 08/09/19 0423 08/10/19 0403 08/11/19 0407 08/12/19 0424 08/13/19 0714 08/14/19 0837  NA 146*   < > 146*   < > 148* 147* 148* 149* 146*  K 3.6   < > 4.3   < > 4.3 4.4 4.6 4.0 4.4  CL 111   < > 111   < > 111 108 112* 114* 114*  CO2 23   < > 24   < > 24 28 26 25 26   GLUCOSE 148*   < > 256*   < > 320* 387* 191* 164* 168*  BUN 71*   < > 78*   < > 100* 110* 84* 59* 58*  CREATININE 2.23*   < > 2.19*   < > 3.45* 3.11* 2.37* 1.91* 1.95*  CALCIUM 8.4*   < > 8.8*   < > 8.8* 8.8* 8.5* 8.2* 8.4*  MG 2.6*  --  3.0*  --  3.5* 3.5* 3.0*  --   --   PHOS 3.6  --  4.5  --   --  5.8*  --   --   --    < > = values in this interval not displayed.   GFR: Estimated Creatinine Clearance: 36.3 mL/min (A) (by C-G formula based on SCr of 1.95 mg/dL (H)). Liver Function Tests: Recent Labs  Lab 08/08/19 0346 08/09/19 0423 08/10/19 0403 08/11/19 0407 08/12/19 0424  AST 168* 209* 118*  --  61*  ALT 74* 114* 120*  --  102*  ALKPHOS 60 67 68  --  61  BILITOT 1.1 1.3* 0.9  --  1.7*  PROT 5.3* 5.9* 6.3*  --  5.9*  ALBUMIN 3.0* 3.3* 3.3* 3.2* 2.9*   No results for input(s): LIPASE, AMYLASE in the last 168 hours. Recent Labs  Lab 08/09/19 1107  AMMONIA 25   Coagulation Profile: No results for input(s): INR, PROTIME in the last 168 hours. Cardiac Enzymes: No results for input(s): CKTOTAL, CKMB, CKMBINDEX, TROPONINI in the last 168 hours. BNP (last 3 results) No results for input(s): PROBNP in the last 8760 hours. HbA1C: No results for input(s): HGBA1C in the last 72 hours. CBG: Recent Labs   Lab 08/12/19 2124 08/13/19 0806 08/13/19 1726 08/14/19 0800 08/14/19 1147  GLUCAP 265* 148* 225* 146* 139*   Lipid Profile: No results for input(s): CHOL, HDL, LDLCALC, TRIG, CHOLHDL, LDLDIRECT in the last 72 hours. Thyroid Function Tests: No results for input(s): TSH, T4TOTAL, FREET4, T3FREE, THYROIDAB in the last 72 hours. Anemia Panel: No results for input(s): VITAMINB12, FOLATE, FERRITIN, TIBC, IRON, RETICCTPCT in the last 72 hours. Sepsis Labs: Recent Labs  Lab 08/08/19 0346 08/09/19 0423  PROCALCITON <0.10 0.10    Recent Results (from the past 240 hour(s))  Culture, Urine     Status: None   Collection Time: 08/04/19  3:08 PM   Specimen: Urine, Random  Result Value Ref Range Status   Specimen Description   Final    URINE, RANDOM Performed at [place] [address] [place], [place] [address]    Special Requests   Final    NONE Performed at [facility], [place] [address], [place], [place] [address]    Culture   Final    NO GROWTH Performed at [facility], [place] [address] [place], [place] [address]    Report Status 08/05/2019 FINAL  Final  Culture, Urine     Status: None   Collection Time: 08/07/19  4:36 PM   Specimen: Urine, Clean Catch  Result Value Ref Range Status   Specimen Description   Final    URINE, CLEAN CATCH Performed at [facility], [place] [address] [place], [place] [address]    Special Requests   Final    NONE Performed at [facility], [place] [address] [place], [place] [address]    Culture   Final    NO GROWTH Performed at [facility], [place] [address] [place], [place] [address]    Report Status 08/09/2019 FINAL  Final  Culture, blood (Routine X 2) w Reflex to ID Panel     Status: None (Preliminary result)   Collection Time: 08/10/19  9:44 AM   Specimen: BLOOD  Result Value Ref Range Status   Specimen Description   Final    BLOOD LEFT  ANTECUBITAL Performed at [facility],  [place] [address] [place], [place] [address]    Special Requests   Final    BOTTLES DRAWN AEROBIC AND ANAEROBIC Blood Culture adequate volume Performed at [place] [address], [place], [place] [address]    Culture   Final    NO GROWTH 4 DAYS Performed at [facility], [place] [address] [place], [place] [address]    Report Status PENDING  Incomplete  Culture, blood (Routine X 2) w Reflex to ID Panel     Status: None (Preliminary result)   Collection Time: 08/10/19  9:44 AM   Specimen: BLOOD  Result Value Ref Range Status   Specimen Description   Final    BLOOD RIGHT ANTECUBITAL Performed at [facility] [address] [place], [place] [address]    Special Requests   Final    BOTTLES DRAWN AEROBIC AND ANAEROBIC Blood Culture adequate volume Performed at [place] [address] [place], [place] [address]    Culture   Final    NO GROWTH 4 DAYS Performed at [facility], [place] [address], [place], [place] [address]    Report Status PENDING  Incomplete  Culture, Urine     Status: None   Collection Time: 08/10/19  9:59 AM   Specimen: Urine, Catheterized  Result Value Ref Range Status   Specimen Description   Final    URINE, CATHETERIZED Performed at [place] [address] [place], [place] [address]    Special Requests   Final    NONE Performed at [facility], [place] [address], [place], [place] [address]    Culture   Final    NO GROWTH Performed at [facility], [place] [address], [place], [place] [address]    Report Status 08/11/2019 FINAL  Final         Radiology Studies: No results found.      Scheduled Meds: . amLODipine  10 mg Oral Daily  . vitamin C  500 mg Oral Daily  . aspirin  81 mg Oral Daily  . atorvastatin  40 mg Oral QPM  . bisacodyl  10 mg Oral Daily  .  Chlorhexidine Gluconate Cloth  6 each Topical Daily  . heparin  5,000 Units Subcutaneous Q8H  . hydrALAZINE  10 mg Intravenous Once  . insulin aspart  0-5 Units Subcutaneous QHS  . insulin aspart  0-6 Units Subcutaneous TID WC  . insulin glargine  35 Units Subcutaneous Daily  . Ipratropium-Albuterol  1 puff Inhalation TID  . linagliptin  5 mg Oral Daily  . senna  2 tablet Oral QHS  . tamsulosin  0.4 mg Oral Daily  . zinc sulfate  220 mg Oral Daily   Continuous Infusions: . sodium chloride 150 mL/hr at 08/14/19 1142  . famotidine (PEPCID) IV 20 mg (08/14/19 1143)     LOS: 10 days     Georgette Shell, MD 08/14/2019, 12:23 PM

## 2019-08-14 NOTE — Progress Notes (Signed)
PT Cancellation Note  Patient Details Name: Raydin Bielinski MRN: [id number] DOB: [birth year]   Cancelled Treatment:    Reason Eval/Treat Not Completed: Medical issues which prohibited therapy(per RN, pt is orthostatic with dizziness in standing, and HR in 130s in standing. Pt is receiving a bolus.  Will hold today and check back tomorrow.)  Philomena Doheny PT 08/14/2019  Acute Rehabilitation Services Pager [phone] Office [phone]

## 2019-08-15 LAB — CULTURE, BLOOD (ROUTINE X 2)
Culture: NO GROWTH
Culture: NO GROWTH
Special Requests: ADEQUATE
Special Requests: ADEQUATE

## 2019-08-15 LAB — URINE CULTURE
Culture: NO GROWTH
Special Requests: NORMAL

## 2019-08-15 LAB — GLUCOSE, CAPILLARY
Glucose-Capillary: 166 mg/dL — ABNORMAL HIGH (ref 70–99)
Glucose-Capillary: 34 mg/dL — CL (ref 70–99)
Glucose-Capillary: 126 mg/dL — ABNORMAL HIGH (ref 70–99)
Glucose-Capillary: 151 mg/dL — ABNORMAL HIGH (ref 70–99)
Glucose-Capillary: 87 mg/dL (ref 70–99)

## 2019-08-15 MED ORDER — DEXTROSE 50 % IV SOLN
INTRAVENOUS | Status: AC
  Filled 2019-08-15: qty 50

## 2019-08-15 MED ORDER — AMLODIPINE BESYLATE 5 MG PO TABS
2.5000 mg | ORAL_TABLET | Freq: Every day | ORAL | Status: DC
  Administered 2019-08-15 – 2019-08-17 (×3): 2.5 mg via ORAL
  Filled 2019-08-15 (×3): qty 1

## 2019-08-15 MED ORDER — DEXTROSE 50 % IV SOLN: 25.0000 g | INTRAVENOUS | Status: AC

## 2019-08-15 MED ORDER — FAMOTIDINE 20 MG PO TABS
20.0000 mg | ORAL_TABLET | Freq: Every day | ORAL | Status: DC
  Administered 2019-08-16 – 2019-08-17 (×2): 20 mg via ORAL
  Filled 2019-08-15 (×2): qty 1

## 2019-08-15 NOTE — Progress Notes (Addendum)
Hypoglycemic Event  CBG: 34  Treatment: D50 50 mL (25 gm)  Symptoms: Pt more lethargic  Follow-up CBG: [id number] CBG Result:166  Possible Reasons for Event: Other: Pt NPO for past 12 hours and having diminished oral intake  Comments/MD notified: Rodena Piety, MD notified    Ellin Saba

## 2019-08-15 NOTE — Evaluation (Signed)
Physical Therapy Evaluation Patient Details Name: Lucas Hobbs MRN: [id number] DOB: 1955-12-24 Today's Date: 08/15/2019   History of Present Illness  64 yo male admitted with COVID pna, hypoglycemia. Hx of Asperger's, CAD, CABG, CKD, DM  Clinical Impression  On eval, pt required Min assist for mobility. He walked ~40 feet around the room. Pt denied dizziness during session. He was minimally communicative. Some encouragement needed to get him to participate. He required increased time to complete tasks. He is unsteady when ambulating. Will continue to follow and progress activity as tolerated.     Follow Up Recommendations Home health PT;Supervision - Intermittent    Equipment Recommendations  None recommended by PT    Recommendations for Other Services       Precautions / Restrictions Precautions Precautions: Fall Restrictions Weight Bearing Restrictions: No      Mobility  Bed Mobility Overal bed mobility: Needs Assistance Bed Mobility: Supine to Sit;Sit to Supine     Supine to sit: Supervision Sit to supine: Modified independent (Device/Increase time)   General bed mobility comments: Increased time.  Transfers Overall transfer level: Needs assistance   Transfers: Sit to/from Stand Sit to Stand: Min guard         General transfer comment: Close guard for safety. Increased time.  Ambulation/Gait Ambulation/Gait assistance: Min Web designer (Feet): 40 Feet Assistive device: IV Pole Gait Pattern/deviations: Step-through pattern;Decreased stride length     General Gait Details: Unsteady. No c/o dizziness. 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)             Home Equipment: None      Prior Function Level of Independence: Independent               Hand Dominance        Extremity/Trunk Assessment   Upper Extremity Assessment Upper Extremity Assessment: Overall WFL for tasks assessed    Lower Extremity Assessment Lower Extremity Assessment: Generalized weakness    Cervical / Trunk Assessment Cervical / Trunk Assessment: Normal  Communication   Communication: (very minimal communication)  Cognition Arousal/Alertness: Awake/alert Behavior During Therapy: Flat affect Overall Cognitive Status: History of cognitive impairments - at baseline(Asperger's (per chart))                                        General Comments      Exercises     Assessment/Plan    PT Assessment Patient needs continued PT services  PT Problem List Decreased mobility       PT Treatment Interventions Gait training;Therapeutic activities;Therapeutic exercise;Patient/family education;Balance training;Functional mobility training    PT Goals (Current goals can be found in the Care Plan section)  Acute Rehab PT Goals Patient Stated Goal: none stated PT Goal Formulation: With patient Time For Goal Achievement: 08/29/19 Potential to  Achieve Goals: Good    Frequency Min 3X/week   Barriers to discharge Decreased caregiver support      Co-evaluation               AM-PAC PT "6 Clicks" Mobility  Outcome Measure Help needed turning from your back to your side while in a flat bed without using bedrails?: A Little Help needed moving from lying on your back to sitting on the side of a flat bed without using bedrails?: A Little Help needed moving to and from a bed to a chair (including a wheelchair)?: A Little Help needed standing up from a chair using your arms (e.g., wheelchair or  bedside chair)?: A Little Help needed to walk in hospital room?: A Little Help needed climbing 3-5 steps with a railing? : A Little 6 Click Score: 18    End of Session Equipment Utilized During Treatment: Gait belt Activity Tolerance: Patient tolerated treatment well Patient left: in bed;with call bell/phone within reach;with bed alarm set   PT Visit Diagnosis: Difficulty in walking, not elsewhere classified (R26.2);Muscle weakness (generalized) (M62.81)    Time: 1440-1450 PT Time Calculation (min) (ACUTE ONLY): 10 min   Charges:   PT Evaluation $PT Eval Moderate Complexity: 1 Mod            Kentavious Michele P, PT Acute Rehabilitation

## 2019-08-15 NOTE — Progress Notes (Signed)
PROGRESS NOTE    Ritchard Paragas  PNT:[id number] DOB: 1956/04/27 DOA: 08/03/2019 PCP: Jiles Garter, MD  Brief Narrative: 64 y.o.malewithhistory of diabetes mellitus type 2, chronic kidney disease stage IV, hypertension, CAD status post CABG in 2019, hypertension presented to the ER because of persistent nausea vomiting and diarrhea with the last 1 week. Patient states he has been having persistent vomiting with no blood in it and occasional diarrhea. Denied any abdominal pain. Had been a constant hiccups. He still takes insulin. When EMS was called in CBG was around 76. Was given D10 and Zofran. Blood sugar improved to 114.  ED Course:In the ER patient blood sugar again dropped around 60s and lab work show creatinine of 2.9 AST 78 platelets 132 EKG normal sinus rhythm chest x-ray unremarkable. Covid test pending. On exam abdomen appears benign. Patient was started on D5 normal saline and admitted for hypoglycemia and persistent nausea vomiting. With hiccups.  Assessment & Plan:   Principal Problem:   Hypoglycemia Active Problems:   CKD (chronic kidney disease), stage IV (HCC)   Nausea & vomiting   Essential hypertension   DM (diabetes mellitus), type 2 with renal complications (HCC)   CAD (coronary artery disease)   COVID-19   Pneumonia due to COVID-19 virus   Pressure injury of skin   #1acute hypoxic respiratory failure secondary to Covid pneumonia-stableS/p remdesvir On decadron Was treated for possible aspiration pneumonia with Unasyn/Augmentin. This was stopped as his procalcitonin was negative. Chest x-ray from 08/07/2019 multifocal pneumonia. On room air saturating normal chest x-ray4/7 -improving b/l lung opacities  #2 intractable nausea vomiting and diarrhea likely secondary to Covid  resolving CT of the abdomen and pelvis shows no acute abnormalities. Patient started with nausea and vomiting again today 08/14/2019.  08/15/2019 nausea vomiting improved  patient had p.m. last night after smog enema.  Started him on carb modified diet.   #3 hypertension-his blood pressure is very labile between 183/86 200/70.  He likely has autonomic neuropathy.  .    #4 CAD status post CABG-had elevated troponin likely from demand ischemia secondary to Covid.  Continue statins.  Beta-blocker stopped due to bradycardia.  Norvasc was stopped due to orthostatic hypotension.   #5 type 2 diabetes with hyperglycemia-he was hypoglycemic on presentation.   Patient hypoglycemic again this morning.  Given D50.  Diet restarted.  Hold Lantus and Tradjenta today.  #6 AKI on CKD stage IV creatinine slowly improving. Creatinine1.9down from 3.45. Continue IV fluids. Renal ultrasound shows no hydronephrosis.   #7 acute metabolic encephalopathy-patient continues with confusion and delirium.  Likely multifactorial steroids/hypoxia/uremia-ammonia levels normal. CT head shows no acute changes. 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 He was treated with Risperdal and Haldol which was stopped due to QTC prolongation. Discussed with his wife. Patient's baseline is normal mental status he walked from home and he took care of his mother at home. Unfortunately he still remains confused.  #8 BPH/urinary retention acute check UA urine culture will place Foley back.  He is retaining urine with bladder scan over 300 cc.  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  Symptomatic treatment.  PT consult pending.  Pressure Injury 08/09/19 Buttocks Left Stage 1 -  Intact skin with non-blanchable redness of a localized area usually over a bony prominence. (Active)  08/09/19 2230  Location: Buttocks  Location Orientation: Left  Staging: Stage 1 -  Intact skin with non-blanchable redness of  a localized area usually over a bony prominence.  Wound Description (Comments):   Present on  Admission:    DVT prophylaxis:heparin Code Status:full Family Communication:Discussed with his wife [phone] who lives in [place] came from home Anticipated DC placeunknown. PT evaluation pending. Patient lived with his mother at home in [place]. 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 His wife lives in [place].  Barrier to discharge patient with confusion new onset and persistent hypoxia  Consultants:None   Procedures:None Antimicrobials:None  Estimated body mass index is 23.51 kg/m as calculated from the following:   Height as of this encounter: 5\' 7"  (1.702 m).   Weight as of this encounter: 68.1 kg.   DVT prophylaxis:heparin Code Status:full Family Communication:Discussed with his wife [phone] who lives in [place] came from home Anticipated DC placeunknown. PT evaluation pending. Patient lived with his mother at home in [place]. 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 His wife lives in [place].  Barrier to discharge patient with confusion new onset and persistent hypoxia  Consultants:None   Procedures:None Antimicrobials:None   Subjective:  He is awake slow confused Low blood sugar noted  Objective: Vitals:   08/14/19 1329 08/14/19 2120 08/15/19 0540 08/15/19 1145  BP:   (!) 149/66 100/70  Pulse:   99   Resp: 17 15 18    Temp: 97.9 F (36.6 C) 98.3 F (36.8 C) 98.1 F (36.7 C)   TempSrc: Oral Oral Oral   SpO2: 93% 94% 92%   Weight:   68.1 kg   Height:        Intake/Output Summary (Last 24 hours) at 08/15/2019 1158 Last data filed at 08/15/2019 0500 Gross per 24 hour  Intake 2024.59 ml  Output 1600 ml  Net 424.59 ml   Filed Weights   08/13/19 0500 08/14/19 0516 08/15/19 0540  Weight: 68.3 kg 67.5 kg 68.1 kg    Examination:  General exam: Appears confused Respiratory system: Clear to auscultation. Respiratory effort normal.  Cardiovascular system: S1 & S2 heard, RRR. No JVD, murmurs, rubs, gallops or clicks. No pedal edema. Gastrointestinal system: Abdomen is nondistended, soft and nontender. No organomegaly or masses felt. Normal bowel sounds heard. Central nervous system: Confused moves all extremities.   Extremities: Symmetric 5 x 5 power. Skin: No rashes, lesions or ulcers Psychiatry: Judgement and insight appear normal. Mood & affect appropriate.     Data Reviewed: I have personally reviewed following labs and imaging studies  CBC: Recent Labs  Lab 08/09/19 0423 08/10/19 0403 08/11/19 0407  WBC 10.9* 14.7* 13.8*  NEUTROABS 8.9* 12.4* 11.8*  HGB 14.4 15.3 15.1  HCT 43.2 47.4 47.7  MCV 85.7 87.5 89.0  PLT 166 204 923   Basic Metabolic Panel: Recent Labs  Lab 08/09/19 0423 08/09/19 0423 08/10/19 0403 08/11/19 0407 08/12/19 0424 08/13/19 0714 08/14/19 0837  NA 146*   < > 148* 147* 148* 149* 146*  K 4.3   < > 4.3 4.4 4.6 4.0 4.4  CL 111   < > 111 108 112* 114* 114*  CO2 24   < > 24 28 26 25 26   GLUCOSE 256*   < > 320* 387* 191* 164* 168*  BUN 78*   < > 100* 110* 84* 59* 58*  CREATININE 2.19*   < > 3.45* 3.11* 2.37* 1.91* 1.95*  CALCIUM 8.8*   < > 8.8* 8.8* 8.5* 8.2* 8.4*  MG 3.0*  --  3.5* 3.5* 3.0*  --   --  PHOS 4.5  --   --  5.8*  --   --   --    < > = values in this interval not displayed.   GFR: Estimated Creatinine Clearance: 36.3 mL/min (A) (by C-G formula based on SCr of 1.95 mg/dL (H)). Liver Function Tests: Recent Labs  Lab 08/09/19 0423 08/10/19 0403 08/11/19 0407 08/12/19 0424  AST 209* 118*  --  61*  ALT 114* 120*  --  102*  ALKPHOS 67 68  --  61  BILITOT 1.3* 0.9  --  1.7*  PROT 5.9* 6.3*  --  5.9*  ALBUMIN 3.3* 3.3* 3.2* 2.9*   No results for input(s): LIPASE, AMYLASE in the last 168 hours. Recent Labs  Lab 08/09/19 1107  AMMONIA 25   Coagulation Profile: No results for input(s): INR, PROTIME in the last 168 hours. Cardiac Enzymes: No results for  input(s): CKTOTAL, CKMB, CKMBINDEX, TROPONINI in the last 168 hours. BNP (last 3 results) No results for input(s): PROBNP in the last 8760 hours. HbA1C: No results for input(s): HGBA1C in the last 72 hours. CBG: Recent Labs  Lab 08/14/19 2114 08/15/19 0827 08/15/19 0831 08/15/19 0907 08/15/19 1151  GLUCAP 87 36* 34* 166* 126*   Lipid Profile: No results for input(s): CHOL, HDL, LDLCALC, TRIG, CHOLHDL, LDLDIRECT in the last 72 hours. Thyroid Function Tests: No results for input(s): TSH, T4TOTAL, FREET4, T3FREE, THYROIDAB in the last 72 hours. Anemia Panel: No results for input(s): VITAMINB12, FOLATE, FERRITIN, TIBC, IRON, RETICCTPCT in the last 72 hours. Sepsis Labs: Recent Labs  Lab 08/09/19 0423  PROCALCITON 0.10    Recent Results (from the past 240 hour(s))  Culture, Urine     Status: None   Collection Time: 08/07/19  4:36 PM   Specimen: Urine, Clean Catch  Result Value Ref Range Status   Specimen Description   Final    URINE, CLEAN CATCH Performed at [facility], [place] [address] [place], [place] [address]    Special Requests   Final    NONE Performed at [facility], [place] [address] [place], [place] [address]    Culture   Final    NO GROWTH Performed at [facility], [place] [address] [place], [place] [address]    Report Status 08/09/2019 FINAL  Final  Culture, blood (Routine X 2) w Reflex to ID Panel     Status: None   Collection Time: 08/10/19  9:44 AM   Specimen: BLOOD  Result Value Ref Range Status   Specimen Description   Final    BLOOD LEFT ANTECUBITAL Performed at [facility] [address], [place], [place] [address]    Special Requests   Final    BOTTLES DRAWN AEROBIC AND ANAEROBIC Blood Culture adequate volume Performed at [facility] [address], [place], [place] [address]    Culture   Final    NO GROWTH 5 DAYS Performed at [facility], [place]  [address] [place], [place] [address]    Report Status 08/15/2019 FINAL  Final  Culture, blood (Routine X 2) w Reflex to ID Panel     Status: None   Collection Time: 08/10/19  9:44 AM   Specimen: BLOOD  Result Value Ref Range Status   Specimen Description   Final    BLOOD RIGHT ANTECUBITAL Performed at [facility] [address], [place], [place] [address]    Special Requests   Final  BOTTLES DRAWN AEROBIC AND ANAEROBIC Blood Culture adequate volume Performed at [facility] [address] [place], [place] [address]    Culture   Final    NO GROWTH 5 DAYS Performed at [facility], [place] [address] [place], [place] [address]    Report Status 08/15/2019 FINAL  Final  Culture, Urine     Status: None   Collection Time: 08/10/19  9:59 AM   Specimen: Urine, Catheterized  Result Value Ref Range Status   Specimen Description   Final    URINE, CATHETERIZED Performed at [facility] [address], [place], [place] [address]    Special Requests   Final    NONE Performed at [facility], [place] [address], [place], [place] [address]    Culture   Final    NO GROWTH Performed at [facility], [place] [address], [place], [place] [address]    Report Status 08/11/2019 FINAL  Final         Radiology Studies: DG Abd 1 View  Result Date: 08/14/2019 CLINICAL DATA:  Nausea vomiting EXAM: ABDOMEN - 1 VIEW COMPARISON:  Abdominal film 08/09/2019 FINDINGS: Post median sternotomy. Lung bases with scattered airspace disease similar to prior chest x-ray for abdominal series. No signs of bowel obstruction. Stool and gas throughout the colon. Stool and rectal gas. No acute bone finding. No signs of abnormal calcification. IMPRESSION: Scattered basilar airspace disease better assessed on previous dedicated chest radiography. No signs of bowel obstruction. Electronically Signed   By: Zetta Bills M.D.   On: 08/14/2019  15:34        Scheduled Meds: . vitamin C  500 mg Oral Daily  . aspirin  81 mg Oral Daily  . atorvastatin  40 mg Oral QPM  . bisacodyl  10 mg Oral Daily  . Chlorhexidine Gluconate Cloth  6 each Topical Daily  . dextrose  25 g Intravenous STAT  . heparin  5,000 Units Subcutaneous Q8H  . hydrALAZINE  10 mg Intravenous Once  . insulin aspart  0-5 Units Subcutaneous QHS  . insulin aspart  0-6 Units Subcutaneous TID WC  . insulin glargine  15 Units Subcutaneous Daily  . Ipratropium-Albuterol  1 puff Inhalation TID  . linagliptin  5 mg Oral Daily  . senna  2 tablet Oral QHS  . tamsulosin  0.4 mg Oral Daily  . zinc sulfate  220 mg Oral Daily   Continuous Infusions: . sodium chloride 150 mL/hr at 08/15/19 1105  . famotidine (PEPCID) IV 20 mg (08/15/19 1122)     LOS: 11 days     Georgette Shell, MD 08/15/2019, 11:58 AM

## 2019-08-16 LAB — CBC
HCT: 35.1 % — ABNORMAL LOW (ref 39.0–52.0)
Hemoglobin: 11.1 g/dL — ABNORMAL LOW (ref 13.0–17.0)
MCH: 28.3 pg (ref 26.0–34.0)
MCHC: 31.6 g/dL (ref 30.0–36.0)
MCV: 89.5 fL (ref 80.0–100.0)
Platelets: 128 10*3/uL — ABNORMAL LOW (ref 150–400)
RBC: 3.92 MIL/uL — ABNORMAL LOW (ref 4.22–5.81)
RDW: 13.3 % (ref 11.5–15.5)
WBC: 10.4 10*3/uL (ref 4.0–10.5)
nRBC: 0 % (ref 0.0–0.2)

## 2019-08-16 LAB — COMPREHENSIVE METABOLIC PANEL
ALT: 46 U/L — ABNORMAL HIGH (ref 0–44)
AST: 35 U/L (ref 15–41)
Albumin: 2.3 g/dL — ABNORMAL LOW (ref 3.5–5.0)
Alkaline Phosphatase: 46 U/L (ref 38–126)
Anion gap: 9 (ref 5–15)
BUN: 27 mg/dL — ABNORMAL HIGH (ref 8–23)
CO2: 23 mmol/L (ref 22–32)
Calcium: 7.7 mg/dL — ABNORMAL LOW (ref 8.9–10.3)
Chloride: 107 mmol/L (ref 98–111)
Creatinine, Ser: 1.48 mg/dL — ABNORMAL HIGH (ref 0.61–1.24)
GFR calc Af Amer: 58 mL/min — ABNORMAL LOW (ref >60)
GFR calc non Af Amer: 50 mL/min — ABNORMAL LOW (ref >60)
Glucose, Bld: 163 mg/dL — ABNORMAL HIGH (ref 70–99)
Potassium: 4 mmol/L (ref 3.5–5.1)
Sodium: 139 mmol/L (ref 135–145)
Total Bilirubin: 0.8 mg/dL (ref 0.3–1.2)
Total Protein: 4.8 g/dL — ABNORMAL LOW (ref 6.5–8.1)

## 2019-08-16 LAB — GLUCOSE, CAPILLARY
Glucose-Capillary: 64 mg/dL — ABNORMAL LOW (ref 70–99)
Glucose-Capillary: 57 mg/dL — ABNORMAL LOW (ref 70–99)
Glucose-Capillary: 68 mg/dL — ABNORMAL LOW (ref 70–99)
Glucose-Capillary: 93 mg/dL (ref 70–99)
Glucose-Capillary: 167 mg/dL — ABNORMAL HIGH (ref 70–99)
Glucose-Capillary: 186 mg/dL — ABNORMAL HIGH (ref 70–99)
Glucose-Capillary: 95 mg/dL (ref 70–99)
Glucose-Capillary: 191 mg/dL — ABNORMAL HIGH (ref 70–99)

## 2019-08-16 NOTE — Plan of Care (Signed)
  Problem: Respiratory: Goal: Complications related to the disease process, condition or treatment will be avoided or minimized Outcome: Progressing   Problem: Education: Goal: Knowledge of General Education information will improve Description: Including pain rating scale, medication(s)/side effects and non-pharmacologic comfort measures Outcome: Progressing   Problem: Health Behavior/Discharge Planning: Goal: Ability to manage health-related needs will improve Outcome: Progressing   Problem: Clinical Measurements: Goal: Ability to maintain clinical measurements within normal limits will improve Outcome: Progressing Goal: Will remain free from infection Outcome: Progressing Goal: Diagnostic test results will improve Outcome: Progressing Goal: Cardiovascular complication will be avoided Outcome: Progressing   Problem: Activity: Goal: Risk for activity intolerance will decrease Outcome: Progressing   Problem: Nutrition: Goal: Adequate nutrition will be maintained Outcome: Progressing   Problem: Coping: Goal: Level of anxiety will decrease Outcome: Progressing   Problem: Elimination: Goal: Will not experience complications related to bowel motility Outcome: Progressing   Problem: Pain Managment: Goal: General experience of comfort will improve Outcome: Progressing   Problem: Safety: Goal: Ability to remain free from injury will improve Outcome: Progressing   Problem: Skin Integrity: Goal: Risk for impaired skin integrity will decrease Outcome: Progressing

## 2019-08-16 NOTE — Progress Notes (Signed)
PROGRESS NOTE    Lucas Hobbs  MVE:[id number] DOB: [birth year] DOA: 08/03/2019 PCP: Jiles Garter, MD   Brief Narrative: 64 y.o.malewithhistory of diabetes mellitus type 2, chronic kidney disease stage IV, hypertension, CAD status post CABG in 2019, hypertension presented to the ER because of persistent nausea vomiting and diarrhea with the last 1 week. Patient states he has been having persistent vomiting with no blood in it and occasional diarrhea. Denied any abdominal pain. Had been a constant hiccups. He still takes insulin. When EMS was called in CBG was around 71. Was given D10 and Zofran. Blood sugar improved to 114.  ED Course:In the ER patient blood sugar again dropped around 60s and lab work show creatinine of 2.9 AST 78 platelets 132 EKG normal sinus rhythm chest x-ray unremarkable. Covid test pending. On exam abdomen appears benign. Patient was started on D5 normal saline and admitted for hypoglycemia and persistent nausea vomiting. With hiccups.  Assessment & Plan:   Principal Problem:   Hypoglycemia Active Problems:   CKD (chronic kidney disease), stage IV (HCC)   Nausea & vomiting   Essential hypertension   DM (diabetes mellitus), type 2 with renal complications (HCC)   CAD (coronary artery disease)   COVID-19   Pneumonia due to COVID-19 virus   Pressure injury of skin   #1acute hypoxic respiratory failure secondary to Covid pneumonia-stableS/p remdesvir And decadron Was treated for possible aspiration pneumonia with Unasyn/Augmentin. This was stopped as his procalcitonin was negative. Chest x-ray from 08/07/2019 multifocal pneumonia. On room air saturating normal chest x-ray4/7 -improving b/l lung opacities  #2 intractable nausea vomiting and diarrhea likely secondary to Covid  resolving CT of the abdomen and pelvis shows no acute abnormalities. Patient started with nausea and vomiting again today 08/14/2019.  08/15/2019 nausea vomiting improved  patient had p.m. last night after smog enema.  Started him on carb modified diet.  #3 hypertension-his blood pressure is very labile.  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  Restarted Norvasc at low-dose 2.5 mg daily with as needed hydralazine.    .  #4 CAD status post CABG-had elevated troponin likely from demand ischemia secondary to Covid.  Continue statins.  Beta-blocker stopped due to bradycardia.  Norvasc was stopped due to orthostatic hypotension, restarting Norvasc 2.5 mg daily.  #5 type 2 diabetes with hyperglycemia -patient with poor p.o. intake we will hold Lantus and Tradjenta.  His blood sugar was 64 today.  He was given 2 apple juices and graham crackers.  CBG done 30 minutes later  still showed a CBG of 57 and follow-up CBG 93. CBG (last 3)  Recent Labs    08/16/19 0331 08/16/19 0553 08/16/19 0751  GLUCAP 93 167* 186*     #6 AKI on CKD stage IV creatinine slowly improving. Creatinine1.4down from 3.45. Continue IV fluids. Renal ultrasound shows no hydronephrosis.   #7 acute metabolic encephalopathy-his confusion has improved though he is still has periods of confusion.  Patient continues with confusion and delirium. Likely multifactorial steroids/hypoxia/uremia-ammonia levels normal. CT head shows no acute changes. 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 He was treated with Risperdal and Haldol which was stopped due to QTC prolongation. Discussed with his wife. Patient's baseline is normal mental status he walked from home and he took care of his mother at home.   #8 BPH/urinary retention acute check UA urine culture will place Foley back. He is retaining urine with bladder scan over 300 cc.  #9 left buttock stage I pressure ulcer continue  local care  #10 orthostatic hypotension with hypertension likely secondary to autonomic dysfunction.  Symptomatic treatment.  PT consult recommends home health PT. Pressure Injury 08/09/19 Buttocks Left Stage 1 -   Intact skin with non-blanchable redness of a localized area usually over a bony prominence. (Active)  08/09/19 2230  Location: Buttocks  Location Orientation: Left  Staging: Stage 1 -  Intact skin with non-blanchable redness of a localized area usually over a bony prominence.  Wound Description (Comments):   Present on Admission:     Estimated body mass index is 24.17 kg/m as calculated from the following:   Height as of this encounter: 5\' 7"  (1.702 m).   Weight as of this encounter: 70 kg.  DVT prophylaxis:heparin Code Status:full Family Communication:Discussed with his wife [phone] who lives in [place] came from home Anticipated DC placeunknown. PT evaluation pending. Patient lived with his mother at home in [place]. 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 His wife lives in [place].  Barrier to discharge patient with confusion new onset and persistent hypoxia  Consultants:None   Procedures:None Antimicrobials:None Subjective:   Objective: Vitals:   08/15/19 2244 08/16/19 0358 08/16/19 0811 08/16/19 1100  BP: (!) 153/66 (!) 161/72 (!) 163/72 (!) 163/68  Pulse: 79 82 79 68  Resp: 18 16 16    Temp: 98.4 F (36.9 C) 98.6 F (37 C) 98.6 F (37 C)   TempSrc: Oral Oral Oral   SpO2: 97% 96% 94%   Weight:  70 kg    Height:        Intake/Output Summary (Last 24 hours) at 08/16/2019 1149 Last data filed at 08/16/2019 0400 Gross per 24 hour  Intake 3316.38 ml  Output 975 ml  Net 2341.38 ml   Filed Weights   08/14/19 0516 08/15/19 0540 08/16/19 0358  Weight: 67.5 kg 68.1 kg 70 kg    Examination:  General exam: Appears calm and comfortable  Respiratory system: Clear to auscultation. Respiratory effort normal. Cardiovascular system: S1 & S2 heard, RRR. No JVD, murmurs, rubs, gallops or clicks. No pedal edema. Gastrointestinal system: Abdomen is nondistended, soft and nontender. No organomegaly or masses felt.  Normal bowel sounds heard. Central nervous system: Alert and oriented. No focal neurological deficits. Extremities: Symmetric 5 x 5 power. Skin: No rashes, lesions or ulcers Psychiatry: Judgement and insight appear normal. Mood & affect appropriate.     Data Reviewed: I have personally reviewed following labs and imaging studies  CBC: Recent Labs  Lab 08/10/19 0403 08/11/19 0407 08/16/19 0948  WBC 14.7* 13.8* 10.4  NEUTROABS 12.4* 11.8*  --   HGB 15.3 15.1 11.1*  HCT 47.4 47.7 35.1*  MCV 87.5 89.0 89.5  PLT 204 186 025*   Basic Metabolic Panel: Recent Labs  Lab 08/10/19 0403 08/10/19 0403 08/11/19 0407 08/12/19 0424 08/13/19 0714 08/14/19 0837 08/16/19 0948  NA 148*   < > 147* 148* 149* 146* 139  K 4.3   < > 4.4 4.6 4.0 4.4 4.0  CL 111   < > 108 112* 114* 114* 107  CO2 24   < > 28 26 25 26 23   GLUCOSE 320*   < > 387* 191* 164* 168* 163*  BUN 100*   < > 110* 84* 59* 58* 27*  CREATININE 3.45*   < > 3.11* 2.37* 1.91* 1.95* 1.48*  CALCIUM 8.8*   < > 8.8* 8.5* 8.2* 8.4* 7.7*  MG 3.5*  --  3.5* 3.0*  --   --   --  PHOS  --   --  5.8*  --   --   --   --    < > = values in this interval not displayed.   GFR: Estimated Creatinine Clearance: 47.8 mL/min (A) (by C-G formula based on SCr of 1.48 mg/dL (H)). Liver Function Tests: Recent Labs  Lab 08/10/19 0403 08/11/19 0407 08/12/19 0424 08/16/19 0948  AST 118*  --  61* 35  ALT 120*  --  102* 46*  ALKPHOS 68  --  61 46  BILITOT 0.9  --  1.7* 0.8  PROT 6.3*  --  5.9* 4.8*  ALBUMIN 3.3* 3.2* 2.9* 2.3*   No results for input(s): LIPASE, AMYLASE in the last 168 hours. No results for input(s): AMMONIA in the last 168 hours. Coagulation Profile: No results for input(s): INR, PROTIME in the last 168 hours. Cardiac Enzymes: No results for input(s): CKTOTAL, CKMB, CKMBINDEX, TROPONINI in the last 168 hours. BNP (last 3 results) No results for input(s): PROBNP in the last 8760 hours. HbA1C: No results for input(s):  HGBA1C in the last 72 hours. CBG: Recent Labs  Lab 08/16/19 0241 08/16/19 0306 08/16/19 0331 08/16/19 0553 08/16/19 0751  GLUCAP 57* 68* 93 167* 186*   Lipid Profile: No results for input(s): CHOL, HDL, LDLCALC, TRIG, CHOLHDL, LDLDIRECT in the last 72 hours. Thyroid Function Tests: No results for input(s): TSH, T4TOTAL, FREET4, T3FREE, THYROIDAB in the last 72 hours. Anemia Panel: No results for input(s): VITAMINB12, FOLATE, FERRITIN, TIBC, IRON, RETICCTPCT in the last 72 hours. Sepsis Labs: No results for input(s): PROCALCITON, LATICACIDVEN in the last 168 hours.  Recent Results (from the past 240 hour(s))  Culture, Urine     Status: None   Collection Time: 08/07/19  4:36 PM   Specimen: Urine, Clean Catch  Result Value Ref Range Status   Specimen Description   Final    URINE, CLEAN CATCH Performed at [facility], [place] [address], [place], [place] [address]    Special Requests   Final    NONE Performed at [facility], [place] [address], [place], [place] [address]    Culture   Final    NO GROWTH Performed at [facility], [place] [address] [place], [place] [address]    Report Status 08/09/2019 FINAL  Final  Culture, blood (Routine X 2) w Reflex to ID Panel     Status: None   Collection Time: 08/10/19  9:44 AM   Specimen: BLOOD  Result Value Ref Range Status   Specimen Description   Final    BLOOD LEFT ANTECUBITAL Performed at [place] [address], [place], [place] [address]    Special Requests   Final    BOTTLES DRAWN AEROBIC AND ANAEROBIC Blood Culture adequate volume Performed at [place] [address] [place], [place] [address]    Culture   Final    NO GROWTH 5 DAYS Performed at [facility], [place] [address], [place], [place] [address]    Report Status 08/15/2019 FINAL  Final  Culture, blood (Routine X 2) w Reflex to ID Panel     Status: None   Collection Time:  08/10/19  9:44 AM   Specimen: BLOOD  Result Value Ref Range Status   Specimen Description   Final    BLOOD RIGHT ANTECUBITAL Performed at [place] [address] [place], [place] [address]    Special Requests   Final  BOTTLES DRAWN AEROBIC AND ANAEROBIC Blood Culture adequate volume Performed at [place] [address], [place], [place] [address]    Culture   Final    NO GROWTH 5 DAYS Performed at [facility], [place] [address] [place], [place] [address]    Report Status 08/15/2019 FINAL  Final  Culture, Urine     Status: None   Collection Time: 08/10/19  9:59 AM   Specimen: Urine, Catheterized  Result Value Ref Range Status   Specimen Description   Final    URINE, CATHETERIZED Performed at [place] [address], [place], [place] [address]    Special Requests   Final    NONE Performed at [facility], [place] [address] [place], [place] [address]    Culture   Final    NO GROWTH Performed at [facility], [place] [address], [place], [place] [address]    Report Status 08/11/2019 FINAL  Final  Culture, Urine     Status: None   Collection Time: 08/14/19 12:29 PM   Specimen: Urine, Catheterized  Result Value Ref Range Status   Specimen Description   Final    URINE, CATHETERIZED Performed at [place] [address], [place], [place] [address]    Special Requests   Final    Normal Performed at [facility], [place] [address], [place], [place] [address]    Culture   Final    NO GROWTH Performed at [facility], [place] [address], [place], [place] [address]    Report Status 08/15/2019 FINAL  Final         Radiology Studies: DG Abd 1 View  Result Date: 08/14/2019 CLINICAL DATA:  Nausea vomiting EXAM: ABDOMEN - 1 VIEW COMPARISON:  Abdominal film 08/09/2019 FINDINGS: Post median sternotomy. Lung bases with scattered airspace  disease similar to prior chest x-ray for abdominal series. No signs of bowel obstruction. Stool and gas throughout the colon. Stool and rectal gas. No acute bone finding. No signs of abnormal calcification. IMPRESSION: Scattered basilar airspace disease better assessed on previous dedicated chest radiography. No signs of bowel obstruction. Electronically Signed   By: Zetta Bills M.D.   On: 08/14/2019 15:34        Scheduled Meds: . amLODipine  2.5 mg Oral Daily  . vitamin C  500 mg Oral Daily  . aspirin  81 mg Oral Daily  . atorvastatin  40 mg Oral QPM  . bisacodyl  10 mg Oral Daily  . Chlorhexidine Gluconate Cloth  6 each Topical Daily  . famotidine  20 mg Oral Daily  . heparin  5,000 Units Subcutaneous Q8H  . hydrALAZINE  10 mg Intravenous Once  . insulin aspart  0-5 Units Subcutaneous QHS  . insulin aspart  0-6 Units Subcutaneous TID WC  . Ipratropium-Albuterol  1 puff Inhalation TID  . senna  2 tablet Oral QHS  . tamsulosin  0.4 mg Oral Daily  . zinc sulfate  220 mg Oral Daily   Continuous Infusions: . sodium chloride 100 mL/hr at 08/16/19 0807     LOS: 12 days     Georgette Shell, MD 08/16/2019, 11:49 AM

## 2019-08-16 NOTE — Evaluation (Signed)
Occupational Therapy Evaluation Patient Details Name: Lucas Hobbs MRN: [id number] DOB: Aug 20, 1955 Today's Date: 08/16/2019    History of Present Illness 64 yo male admitted with COVID pna, hypoglycemia. Hx of Asperger's, CAD, CABG, CKD, DM   Clinical Impression   Pt admitted with the above. Pt currently with functional limitations due to the deficits listed below (see OT Problem List).  Pt will benefit from skilled OT to increase their safety and independence with ADL and functional mobility for ADL to facilitate discharge to venue listed below.      Follow Up Recommendations  Home health OT;Supervision/Assistance - 24 hour    Equipment Recommendations  None recommended by OT    Recommendations for Other Services       Precautions / Restrictions Precautions Precautions: Fall      Mobility Bed Mobility Overal bed mobility: Needs Assistance Bed Mobility: Supine to Sit     Supine to sit: Supervision     General bed mobility comments: Increased time.  Transfers Overall transfer level: Needs assistance Equipment used: Rolling walker (2 wheeled) Transfers: Sit to/from [facility] Sit to Stand: Min assist Stand pivot transfers: Min assist       General transfer comment: Close guard for safety. Increased time.    Balance Overall balance assessment: Needs assistance           Standing balance-Leahy Scale: Fair                             ADL either performed or assessed with clinical judgement   ADL Overall ADL's : Needs assistance/impaired Eating/Feeding: Set up;Sitting   Grooming: Set up;Sitting   Upper Body Bathing: Set up;Sitting   Lower Body Bathing: Moderate assistance;Sit to/from stand;Cueing for compensatory techniques;Cueing for safety;Cueing for sequencing   Upper Body Dressing : Set up;Sitting   Lower Body Dressing: Moderate assistance;Sit to/from stand;Cueing for safety;Cueing for compensatory techniques;Cueing  for sequencing   Toilet Transfer: Minimal assistance;Stand-pivot;Cueing for safety;Cueing for sequencing   Toileting- Clothing Manipulation and Hygiene: Moderate assistance;Sit to/from stand;Cueing for safety;Cueing for compensatory techniques;Cueing for sequencing       Functional mobility during ADLs: Minimal assistance       Vision Patient Visual Report: No change from baseline              Pertinent Vitals/Pain Pain Assessment: No/denies pain     Hand Dominance     Extremity/Trunk Assessment Upper Extremity Assessment Upper Extremity Assessment: Generalized weakness       Cervical / Trunk Assessment Cervical / Trunk Assessment: Normal   Communication Communication Communication: No difficulties(very minimal communication)   Cognition Arousal/Alertness: Awake/alert Behavior During Therapy: Flat affect Overall Cognitive Status: History of cognitive impairments - at baseline(Asperger's (per chart))                                                Home Living Family/patient expects to be discharged to:: Private residence Living Arrangements: Parent(pt is 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) Available Help at Discharge: Family Type of Home: House       Home Layout: One level     Bathroom Shower/Tub: Chief Strategy Officer: None          Prior Functioning/Environment Level of Independence: Independent  OT Problem List: Decreased strength;Decreased activity tolerance;Impaired balance (sitting and/or standing);Decreased safety awareness;Decreased knowledge of use of DME or AE;Decreased knowledge of precautions      OT Treatment/Interventions: Self-care/ADL training;Patient/family education;Therapeutic activities    OT Goals(Current goals can be found in the care plan section) Acute Rehab OT Goals Patient Stated Goal: none stated OT Goal Formulation: With patient Time For Goal Achievement:  08/23/19 Potential to Achieve Goals: Good  OT Frequency: Min 2X/week    AM-PAC OT "6 Clicks" Daily Activity     Outcome Measure Help from another person eating meals?: None Help from another person taking care of personal grooming?: A Little Help from another person toileting, which includes using toliet, bedpan, or urinal?: A Lot Help from another person bathing (including washing, rinsing, drying)?: A Lot Help from another person to put on and taking off regular upper body clothing?: A Little Help from another person to put on and taking off regular lower body clothing?: A Lot 6 Click Score: 16   End of Session Equipment Utilized During Treatment: Rolling walker Nurse Communication: Mobility status  Activity Tolerance: Patient tolerated treatment well Patient left: in chair  OT Visit Diagnosis: Unsteadiness on feet (R26.81);Other abnormalities of gait and mobility (R26.89);Muscle weakness (generalized) (M62.81)                Time: 3254-9826 OT Time Calculation (min): 20 min Charges:  OT General Charges $OT Visit: 1 Visit OT Evaluation $OT Eval Moderate Complexity: 1 Mod  Kari Baars, OT Acute Rehabilitation Services [phone] Office- [phone], Edwena Felty D 08/16/2019, 6:04 PM

## 2019-08-16 NOTE — Progress Notes (Signed)
Call placed to [facility] to update regarding plan of care. Concerns expressed regarding discharge plan.

## 2019-08-16 NOTE — Progress Notes (Signed)
Hypoglycemic Event  CBG: 64  Treatment: Drank 2 apple juices and graham crackers  Symptoms: none  Follow-up CBG: Time: 0241 CBG Result: 57  Follow-up CBG: Time: 0331 CBG Result: 93  Possible Reasons for Event: Poor PO intake  Comments/MD notified: MD aware  Laney Potash

## 2019-08-16 NOTE — TOC Progression Note (Signed)
Transition of Care [facility]) - Progression Note    Patient Details  Name: Lucas Hobbs MRN: [id number] Date of Birth: [birth year]  Transition of [facility]) CM/SW Contact  Ross Ludwig, [place] Phone Number: 08/16/2019, 4:58 PM  Clinical Narrative:     CSW called [facility], [facility], [facility], [facility], and [facility], none of them are able to accept him.  CSW  Called [employer] and after speaking to them for 50 minutes, they said the only home health insurance in network is [place].  CSW contacted [facility] again and spoke to Santiago Glad to inform her that per insurance company [facility] is the only agency that is in network with [employer] company she will speak to her supervisor tomorrow to see if they can accept him and let CSW know.  CSW to continue to follow patient's progress throughout discharge planning.       Expected Discharge Plan and Services                                                 Social Determinants of Health (SDOH) Interventions    Readmission Risk Interventions No flowsheet data found.

## 2019-08-17 LAB — GLUCOSE, CAPILLARY
Glucose-Capillary: 100 mg/dL — ABNORMAL HIGH (ref 70–99)
Glucose-Capillary: 133 mg/dL — ABNORMAL HIGH (ref 70–99)

## 2019-08-17 MED ORDER — TAMSULOSIN HCL 0.4 MG PO CAPS: 0.4000 mg | ORAL_CAPSULE | Freq: Every day | ORAL | 3 refills | Status: DC

## 2019-08-17 MED ORDER — METOPROLOL SUCCINATE ER 100 MG PO TB24: 50.0000 mg | ORAL_TABLET | Freq: Every evening | ORAL | 3 refills | Status: DC

## 2019-08-17 MED ORDER — INSULIN NPH ISOPHANE & REGULAR (70-30) 100 UNIT/ML ~~LOC~~ SUSP: 12.0000 [IU] | Freq: Two times a day (BID) | SUBCUTANEOUS | 11 refills | Status: DC

## 2019-08-17 MED ORDER — ASCORBIC ACID 500 MG PO TABS: 500.0000 mg | ORAL_TABLET | Freq: Every day | ORAL | Status: AC

## 2019-08-17 MED ORDER — ZINC SULFATE 220 (50 ZN) MG PO CAPS: 220.0000 mg | ORAL_CAPSULE | Freq: Every day | ORAL | Status: DC

## 2019-08-17 NOTE — Progress Notes (Signed)
Patient was discharged home with home health today. Teaching was completed with patient and his mother on foley bag and leg bag. All questions were answered and patient was taken to the main entrance via wheelchair.

## 2019-08-17 NOTE — TOC Progression Note (Addendum)
Transition of Care [facility]) - Progression Note    Patient Details  Name: Lucas Hobbs MRN: [id number] Date of Birth: 17-Apr-1956  Transition of Care [facility]) CM/SW Contact  Joaquin Courts, RN Phone Number: 08/17/2019, 10:30 AM  Clinical Narrative:    CM was able to arrange HHPT/RN services with [facility].  CM could not reach patient by phone, but was able to speak with spouse and mother.  CM explained that in-network provider was not able to accept patient, but family was agreeable to cover the cost of care out of pocket if needed.  [facility] is out of network and there will be a copay/bill associated with the [facility] services but family is agreeable to cover this cost.  Patient will be transported home by family, bedside RN please call mother to arrange pick-up time when ready to dc.     Expected Discharge Plan: [place] Barriers to Discharge: No Barriers Identified  Expected Discharge Plan and Services Expected Discharge Plan: [place]   Discharge Planning Services: CM Consult Post Acute Care Choice: [place] arrangements for the past 2 months: Single Family Home Expected Discharge Date: 08/17/19               DME Arranged: N/A DME Agency: NA       HH Arranged: PT, RN [place] Agency: [place] Date [facility] Agency Contacted: 08/17/19 Time [place]: 1028 Representative spoke with at [place]: [place] ([place]) Interventions    Readmission Risk Interventions Readmission Risk Prevention Plan 08/17/2019  Transportation Screening Complete  PCP or Specialist Appt within 3-5 Days Complete  HRI or [place] Complete  Social Work Consult for [place] Planning/Counseling Complete  Palliative Care Screening Not Applicable  Medication Review Press photographer) Complete

## 2019-08-17 NOTE — Discharge Summary (Signed)
Physician Discharge Summary  Lucas Hobbs ASN:[id number] DOB: 08/27/1955 DOA: 08/03/2019  PCP: Jiles Garter, MD  Admit date: 08/03/2019 Discharge date: 08/17/2019  Admitted From:home Disposition: home  Recommendations for Outpatient Follow-up:  1. Follow up with PCP in 1-2 weeks 2. Please obtain BMP/CBC in one week  Home Health:none Equipment/Devices foley on dc  Discharge Condition:stable CODE STATUS:full Diet recommendation:carb modified Brief/Interim Summary:64 y.o.malewithhistory of diabetes mellitus type 2, chronic kidney disease stage IV, hypertension, CAD status post CABG in 2019, hypertension presented to the ER because of persistent nausea vomiting and diarrhea with the last 1 week. Patient states he has been having persistent vomiting with no blood in it and occasional diarrhea. Denied any abdominal pain. Had been a constant hiccups. He still takes insulin. When EMS was called in CBG was around 27. Was given D10 and Zofran. Blood sugar improved to 114.  ED Course:In the ER patient blood sugar again dropped around 60s and lab work show creatinine of 2.9 AST 78 platelets 132 EKG normal sinus rhythm chest x-ray unremarkable. Covid test pending. On exam abdomen appears benign. Patient was started on D5 normal saline and admitted for hypoglycemia and persistent nausea vomiting. With hiccups.  Discharge Diagnoses:  Principal Problem:   Hypoglycemia Active Problems:   CKD (chronic kidney disease), stage IV (HCC)   Nausea & vomiting   Essential hypertension   DM (diabetes mellitus), type 2 with renal complications (HCC)   CAD (coronary artery disease)   COVID-19   Pneumonia due to COVID-19 virus   Pressure injury of skin  #1acute hypoxic respiratory failure secondary to Covid pneumonia-stableS/p remdesvir And decadron Was treated for possible aspiration pneumonia with Unasyn/Augmentin. This was stopped as his procalcitonin was negative. Chest x-ray from  08/07/2019 multifocal pneumonia. On room air saturating normal chest x-ray4/7 -improving b/l lung opacities  #2 intractable nausea vomiting and diarrhea likely secondary to Covid-resolved  CT of the abdomen and pelvis shows no acute abnormalities.  #3 hypertension-his blood pressure is very labile.  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      Have stopped his Cozaar due to recent AKI, decrease the dose of beta-blocker and Norvasc.  Please adjust the dose of these medications as needed as an outpatient. .  #4 CAD status post CABG-had elevated troponin likely from demand ischemia secondary to Covid.Continue statins.   #5 type 2 diabetes with hyperglycemia and hypoglycemia.  Decrease the dose of 70/30 insulin he takes at home to 12 units twice a day.  Adjust the dose as an outpatient under PCPs care.   #6 AKI on CKD stage IV improved and resolved.  Renal ultrasound showed no evidence of hydronephrosis.   #7 acute metabolic encephalopathy-better.Likely multifactorial steroids/hypoxia/uremia-ammonia levels normal. CT head shows no acute changes. 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 He was treated with Risperdal and Haldol which was stopped due to QTC prolongation. Discussed with his wife. Patient's baseline is normal mental status he walked from home and he took care of his mother at home.   #8 BPH/urinary retention -Foley was taken out but patient was unable to urinate so Foley was placed back in.  Continue Flomax.  Will need to follow-up with [facility] in 2 to 4 weeks.    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 Symptomatic treatment. PT consult recommends home health PT. however insurance did not authorize physical therapy. Pressure Injury 08/09/19 Buttocks Left Stage 1 -  Intact skin with non-blanchable redness of a localized area usually over a bony  prominence. (Active)  08/09/19 2230  Location: Buttocks  Location Orientation: Left  Staging: Stage 1 -  Intact skin with non-blanchable redness of a localized area usually over a bony prominence.  Wound Description (Comments):   Present on Admission:     Estimated body mass index is 24.28 kg/m as calculated from the following:   Height as of this encounter: 5\' 7"  (1.702 m).   Weight as of this encounter: 70.3 kg.  Discharge Instructions  Discharge Instructions    Diet - low sodium heart healthy   Complete by: As directed    Increase activity slowly   Complete by: As directed      Allergies as of 08/17/2019      Reactions   Iodine Hives      Medication List    STOP taking these medications   HYDROcodone-acetaminophen 5-325 MG tablet Commonly known as: NORCO/VICODIN   lisinopril 20 MG tablet Commonly known as: ZESTRIL   ondansetron 4 MG disintegrating tablet Commonly known as: Zofran ODT     TAKE these medications   amLODipine 10 MG tablet Commonly known as: NORVASC Take 10 mg by mouth daily.   ascorbic acid 500 MG tablet Commonly known as: VITAMIN C Take 1 tablet (500 mg total) by mouth daily.   aspirin 81 MG chewable tablet Chew 81 mg by mouth daily.   atorvastatin 40 MG tablet Commonly known as: LIPITOR Take 40 mg by mouth every evening.   insulin NPH-regular Human (70-30) 100 UNIT/ML injection Inject 12 Units into the skin 2 (two) times daily with a meal. 38 in the morning and 36 in the evening What changed:   how much to take  when to take this   metoprolol succinate 100 MG 24 hr tablet Commonly known as: TOPROL-XL Take 0.5 tablets (50 mg total) by mouth every evening. What changed: how much to take   tamsulosin 0.4 MG Caps capsule Commonly known as: FLOMAX Take 1 capsule (0.4 mg total) by mouth daily.   zinc sulfate 220 (50 Zn) MG capsule Take 1 capsule (220 mg total) by mouth daily.      Follow-up Information    Alexis Frock, MD  Follow up.   Specialties: Anesthesiology, Cardiology Contact information: [place] [address] [phone]        Sison, Adele Dan, MD Follow up.   Specialty: Family Medicine Contact information: [place] [address] [phone]          Allergies  Allergen Reactions  . Iodine Hives    Consultations: None  Procedures/Studies: DG Chest 1 View  Result Date: 08/11/2019 CLINICAL DATA:  COVID-19 positive, hypoxemia. EXAM: CHEST  1 VIEW COMPARISON:  August 07, 2019. FINDINGS: Stable cardiomediastinal silhouette. Sternotomy wires are noted. No pneumothorax or pleural effusion is noted. Bilateral lung opacities are decreased compared to prior exam suggesting improving multifocal pneumonia. Bony thorax is unremarkable. IMPRESSION: Improving bilateral lung opacities are noted suggesting improving multifocal pneumonia. Electronically Signed   By: Marijo Conception M.D.   On: 08/11/2019 15:57   DG Chest 2 View  Result Date: 08/03/2019 CLINICAL DATA:  Vomiting, short of breath, hiccups EXAM: CHEST - 2 VIEW COMPARISON:  None. FINDINGS: Frontal and lateral views of the chest demonstrate an unremarkable cardiac silhouette. Postsurgical changes are seen from bypass surgery. No airspace disease, effusion, or pneumothorax. No acute bony abnormalities. IMPRESSION: 1. No  acute intrathoracic process. Electronically Signed   By: Randa Ngo M.D.   On: 08/03/2019 23:22   DG Abd 1 View  Result Date: 08/14/2019 CLINICAL DATA:  Nausea vomiting EXAM: ABDOMEN - 1 VIEW COMPARISON:  Abdominal film 08/09/2019 FINDINGS: Post median sternotomy. Lung bases with scattered airspace disease similar to prior chest x-ray for abdominal series. No signs of bowel obstruction. Stool and gas throughout the colon. Stool and rectal gas. No acute bone finding. No signs of abnormal calcification. IMPRESSION: Scattered basilar airspace disease better assessed on previous  dedicated chest radiography. No signs of bowel obstruction. Electronically Signed   By: Zetta Bills M.D.   On: 08/14/2019 15:34   CT HEAD WO CONTRAST  Result Date: 08/06/2019 CLINICAL DATA:  Delirium EXAM: CT HEAD WITHOUT CONTRAST TECHNIQUE: Contiguous axial images were obtained from the base of the skull through the vertex without intravenous contrast. COMPARISON:  None. FINDINGS: Brain: No acute hemorrhage, acute infarction or space-occupying mass lesion is seen. Multifocal infarcts are noted on the right with encephalomalacia predominately within the distribution of the right middle cerebral artery. Vascular: No hyperdense vessel or unexpected calcification. Skull: Normal. Negative for fracture or focal lesion. Sinuses/Orbits: Mucosal retention cyst is noted in the left maxillary antrum. 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 No acute abnormality noted. Electronically Signed   By: Inez Catalina M.D.   On: 08/06/2019 20:16   US Abdomen Complete  Result Date: 08/04/2019 CLINICAL DATA:  Nausea and vomiting for 7 days EXAM: ABDOMEN ULTRASOUND COMPLETE COMPARISON:  CT from earlier today FINDINGS: Gallbladder: Cholelithiasis. No wall thickening or focal tenderness. Common bile duct: Diameter: 4 mm Liver: No focal lesion identified. Within normal limits in parenchymal echogenicity. Portal vein is patent on color Doppler imaging with normal direction of blood flow towards the liver. IVC: No abnormality visualized. Pancreas: Obscured. Spleen: Size and appearance within normal limits. Right Kidney: Length: 12 cm. Right lower pole cyst measuring 19 mm, correlating with hemorrhagic cyst on prior CT. A thin benign-appearing septation is present. Left Kidney: Length: 11.6 cm. Echogenicity within normal limits. No mass or hydronephrosis visualized. Abdominal aorta: No aneurysm visualized.  Atherosclerosis IMPRESSION: 1. No additional  finding when compared to CT earlier the same day. 2. Cholelithiasis. Electronically Signed   By: Monte Fantasia M.D.   On: 08/04/2019 06:38   US RENAL  Result Date: 08/10/2019 CLINICAL DATA:  Acute renal failure EXAM: RENAL / URINARY TRACT ULTRASOUND COMPLETE COMPARISON:  Ultrasound abdomen 08/04/2019 FINDINGS: Right Kidney: Renal measurements: 10.2 x 5.2 x 6.8 cm = volume: 190 mL. Negative for renal obstruction. Several small renal cysts are present largest measuring 1.5 cm. Normal cortical echogenicity. Left Kidney: Renal measurements: 9.8 x 4.9 x 4.8 cm = volume: 121 mL. Echogenicity within normal limits. No mass or hydronephrosis visualized. Bladder: Normal bladder. The bladder is full with a Foley catheter inflated in the bladder. Other: None. IMPRESSION: No renal obstruction.  Small right renal cyst noted. Bladder is full with a Foley catheter in place. Electronically Signed   By: Franchot Gallo M.D.   On: 08/10/2019 14:56   DG CHEST PORT 1 VIEW  Result Date: 08/07/2019 CLINICAL DATA:  Hypoxia EXAM: PORTABLE CHEST 1 VIEW COMPARISON:  August 04, 2019 FINDINGS: There is new airspace consolidation in the left mid and lower lung zones as well as in the right upper lobe and right base. Heart is upper normal in size with pulmonary vascularity normal.  Patient is status post coronary artery bypass grafting. No adenopathy. No bone lesions. IMPRESSION: Multifocal airspace opacity, new from prior study. Appearance consistent with multifocal pneumonia. Stable cardiac silhouette. Electronically Signed   By: Lowella Grip III M.D.   On: 08/07/2019 08:40   DG Abd Portable 2V  Result Date: 08/09/2019 CLINICAL DATA:  COVID-19 positive. EXAM: PORTABLE ABDOMEN - 2 VIEW COMPARISON:  None. FINDINGS: The bowel gas pattern is normal. There is no evidence of free air. No radio-opaque calculi or other significant radiographic abnormality is seen. IMPRESSION: No evidence of bowel obstruction or ileus. Electronically Signed    By: Marijo Conception M.D.   On: 08/09/2019 12:08   ECHOCARDIOGRAM COMPLETE  Result Date: 08/04/2019    ECHOCARDIOGRAM REPORT   Patient Name:   Lucas Hobbs Date of Exam: 08/04/2019 Medical Rec #:  [id number]   Height:       67.0 in Accession #:    [id number]  Weight:       187.4 lb Date of Birth:  1956-04-30  BSA:          1.967 m Patient Age:    66 years    BP:           166/71 mmHg Patient Gender: M           HR:           75 bpm. Exam Location:  Inpatient Procedure: 2D Echo Indications:    Elevated Troponin  History:        Patient has no prior history of Echocardiogram examinations.                 Risk Factors:Diabetes and Hypertension. CKD                 Covid19 infection.  Sonographer:    Vikki Ports Turrentine Referring Phys: [place]  1. Left ventricular ejection fraction, by estimation, is 55 to 60%. The left ventricle has normal function. The left ventricle has no regional wall motion abnormalities. There is moderate concentric left ventricular hypertrophy. Left ventricular diastolic parameters are indeterminate.  2. Right ventricular systolic function is normal. The right ventricular size is normal.  3. The mitral valve is normal in structure. Trivial mitral valve regurgitation. No evidence of mitral stenosis.  4. The aortic valve has an indeterminant number of cusps. Aortic valve regurgitation is not visualized. No aortic stenosis is present.  5. The inferior vena cava is normal in size with greater than 50% respiratory variability, suggesting right atrial pressure of 3 mmHg. Comparison(s): No prior Echocardiogram. FINDINGS  Left Ventricle: Left ventricular ejection fraction, by estimation, is 55 to 60%. The left ventricle has normal function. The left ventricle has no regional wall motion abnormalities. The left ventricular internal cavity size was normal in size. There is  moderate concentric left ventricular hypertrophy. Left ventricular diastolic parameters are  indeterminate. Right Ventricle: The right ventricular size is normal. No increase in right ventricular wall thickness. Right ventricular systolic function is normal. Left Atrium: Left atrial size was normal in size. Right Atrium: Right atrial size was normal in size. Pericardium: There is no evidence of pericardial effusion. Mitral Valve: The mitral valve is normal in structure. Trivial mitral valve regurgitation. No evidence of mitral valve stenosis. Tricuspid Valve: The tricuspid valve is normal in structure. Tricuspid valve regurgitation is trivial. No evidence of tricuspid stenosis. Aortic Valve: The aortic valve has an indeterminant number of cusps. Aortic valve regurgitation is not visualized.  No aortic stenosis is present. There is mild calcification of the aortic valve. Pulmonic Valve: The pulmonic valve was not well visualized. Pulmonic valve regurgitation is not visualized. No evidence of pulmonic stenosis. Aorta: The aortic root, ascending aorta and aortic arch are all structurally normal, with no evidence of dilitation or obstruction. Pulmonary Artery: The pulmonary artery is not well seen. Venous: The inferior vena cava is normal in size with greater than 50% respiratory variability, suggesting right atrial pressure of 3 mmHg. IAS/Shunts: No atrial level shunt detected by color flow Doppler.  LEFT VENTRICLE PLAX 2D LVIDd:         4.78 cm  Diastology LVIDs:         3.10 cm  LV e' lateral:   7.72 cm/s LV PW:         1.51 cm  LV E/e' lateral: 10.5 LV IVS:        1.76 cm  LV e' medial:    5.00 cm/s LVOT diam:     1.80 cm  LV E/e' medial:  16.2 LV SV:         50 LV SV Index:   25 LVOT Area:     2.54 cm  RIGHT VENTRICLE RV S prime:     15.10 cm/s TAPSE (M-mode): 1.8 cm LEFT ATRIUM             Index       RIGHT ATRIUM           Index LA diam:        2.80 cm 1.42 cm/m  RA Area:     12.80 cm LA Vol (A2C):   51.5 ml 26.18 ml/m RA Volume:   26.90 ml  13.68 ml/m LA Vol (A4C):   30.3 ml 15.40 ml/m LA Biplane  Vol: 39.9 ml 20.28 ml/m  AORTIC VALVE LVOT Vmax:   111.00 cm/s LVOT Vmean:  78.600 cm/s LVOT VTI:    0.196 m  AORTA Ao Root diam: 3.40 cm MITRAL VALVE MV Area (PHT): 4.36 cm    SHUNTS MV Decel Time: 174 msec    Systemic VTI:  0.20 m MV E velocity: 81.00 cm/s  Systemic Diam: 1.80 cm MV A velocity: 92.40 cm/s MV E/A ratio:  0.88 Buford Dresser MD Electronically signed by Buford Dresser MD Signature Date/Time: 08/04/2019/6:42:54 PM    Final    CT RENAL STONE STUDY  Result Date: 08/04/2019 CLINICAL DATA:  Flank pain with kidney stone suspected. EXAM: CT ABDOMEN AND PELVIS WITHOUT CONTRAST TECHNIQUE: Multidetector CT imaging of the abdomen and pelvis was performed following the standard protocol without IV contrast. COMPARISON:  12/07/2018 FINDINGS: Lower chest: Patchy airspace disease in the lower lungs. Coronary atherosclerosis. Hepatobiliary: No focal liver abnormality.Minimal layering high-density in the gallbladder consistent with calculi. No wall thickening or inflammatory changes. Pancreas: Unremarkable. Spleen: Unremarkable. Adrenals/Urinary Tract: Negative adrenals. No hydronephrosis or stone. Hyperdense mass at the lower pole right kidney that is stable at 19 mm, consistent with hemorrhagic cyst given density. There is thinning of the cortex at the upper pole on the left where there is a subtle cystic density. Minimal layering calcification in the urinary bladder towards the left, not mural calcification based on prior. Stomach/Bowel:  No obstruction. No appendicitis. Vascular/Lymphatic: No acute vascular abnormality. Diffuse atherosclerotic calcification of the aorta and branch vessels. No mass or adenopathy. Reproductive:Negative Other: No ascites or pneumoperitoneum. Musculoskeletal: Spondylosis without acute finding These results will be called to the ordering clinician or representative by the Radiologist Assistant,  and communication documented in the PACS or [employer].  IMPRESSION: 1. Patchy airspace disease in the lower lungs with inflammatory appearance. Please correlate with COVID-19 status. 2. No acute intra-abdominal finding. 3. Small volume layering calculi in the gallbladder and urinary bladder. 4.  Aortic Atherosclerosis (ICD10-I70.0).  Coronary atherosclerosis Electronically Signed   By: Monte Fantasia M.D.   On: 08/04/2019 05:11   VAS [place] LOWER EXTREMITY VENOUS (DVT)  Result Date: 08/08/2019  Lower Venous DVTStudy Indications: Hypoxia, covid+.  Comparison Study: no prior Performing Technologist: Abram Sander RVS  Examination Guidelines: A complete evaluation includes B-mode imaging, spectral Doppler, color Doppler, and power Doppler as needed of all accessible portions of each vessel. Bilateral testing is considered an integral part of a complete examination. Limited examinations for reoccurring indications may be performed as noted. The reflux portion of the exam is performed with the patient in reverse Trendelenburg.  +---------+---------------+---------+-----------+----------+--------------+ RIGHT    CompressibilityPhasicitySpontaneityPropertiesThrombus Aging +---------+---------------+---------+-----------+----------+--------------+ CFV      Full           Yes      Yes                                 +---------+---------------+---------+-----------+----------+--------------+ SFJ      Full                                                        +---------+---------------+---------+-----------+----------+--------------+ FV Prox  Full                                                        +---------+---------------+---------+-----------+----------+--------------+ FV Mid   Full                                                        +---------+---------------+---------+-----------+----------+--------------+ FV DistalFull                                                         +---------+---------------+---------+-----------+----------+--------------+ PFV      Full                                                        +---------+---------------+---------+-----------+----------+--------------+ POP      Full           Yes      Yes                                 +---------+---------------+---------+-----------+----------+--------------+ PTV      Full                                                        +---------+---------------+---------+-----------+----------+--------------+  PERO     Full                                                        +---------+---------------+---------+-----------+----------+--------------+   +---------+---------------+---------+-----------+----------+--------------+ LEFT     CompressibilityPhasicitySpontaneityPropertiesThrombus Aging +---------+---------------+---------+-----------+----------+--------------+ CFV      Full           Yes      Yes                                 +---------+---------------+---------+-----------+----------+--------------+ SFJ      Full                                                        +---------+---------------+---------+-----------+----------+--------------+ FV Prox  Full                                                        +---------+---------------+---------+-----------+----------+--------------+ FV Mid   Full                                                        +---------+---------------+---------+-----------+----------+--------------+ FV DistalFull                                                        +---------+---------------+---------+-----------+----------+--------------+ PFV      Full                                                        +---------+---------------+---------+-----------+----------+--------------+ POP      Full           Yes      Yes                                  +---------+---------------+---------+-----------+----------+--------------+ PTV      Full                                                        +---------+---------------+---------+-----------+----------+--------------+ PERO     Full                                                        +---------+---------------+---------+-----------+----------+--------------+  Summary: BILATERAL: - No evidence of deep vein thrombosis seen in the lower extremities, bilaterally.   *See table(s) above for measurements and observations. Electronically signed by Curt Jews MD on 08/08/2019 at 8:17:03 AM.    Final     (Echo, Carotid, EGD, Colonoscopy, ERCP)    Subjective: Patient resting in bed anxious to go home Foley in place draining clear urine  Discharge Exam: Vitals:   08/17/19 0612 08/17/19 0703  BP: (!) 187/78 (!) 166/75  Pulse: 69 74  Resp: 20   Temp: 98.4 F (36.9 C)   SpO2: 97%    Vitals:   08/16/19 2206 08/17/19 0500 08/17/19 0612 08/17/19 0703  BP: (!) 167/72  (!) 187/78 (!) 166/75  Pulse: 92  69 74  Resp: 18  20   Temp: 98.3 F (36.8 C)  98.4 F (36.9 C)   TempSrc: Oral  Oral   SpO2: 94%  97%   Weight:  70.3 kg    Height:        General: Pt is alert, awake, not in acute distress Cardiovascular: RRR, S1/S2 +, no rubs, no gallops Respiratory: CTA bilaterally, no wheezing, no rhonchi Abdominal: Soft, NT, ND, bowel sounds + Extremities: no edema, no cyanosis    The results of significant diagnostics from this hospitalization (including imaging, microbiology, ancillary and laboratory) are listed below for reference.     Microbiology: Recent Results (from the past 240 hour(s))  Culture, Urine     Status: None   Collection Time: 08/07/19  4:36 PM   Specimen: Urine, Clean Catch  Result Value Ref Range Status   Specimen Description   Final    URINE, CLEAN CATCH Performed at [facility], [place] [address], [place], [place] [address]    Special  Requests   Final    NONE Performed at [facility], [place] [address] [place], [place] [address]    Culture   Final    NO GROWTH Performed at [facility], [place] [address] [place], [place] [address]    Report Status 08/09/2019 FINAL  Final  Culture, blood (Routine X 2) w Reflex to ID Panel     Status: None   Collection Time: 08/10/19  9:44 AM   Specimen: BLOOD  Result Value Ref Range Status   Specimen Description   Final    BLOOD LEFT ANTECUBITAL Performed at [place] [address], [place], [place] [address]    Special Requests   Final    BOTTLES DRAWN AEROBIC AND ANAEROBIC Blood Culture adequate volume Performed at [place] [address] [place], [place] [address]    Culture   Final    NO GROWTH 5 DAYS Performed at [facility], [place] [address], [place], [place] [address]    Report Status 08/15/2019 FINAL  Final  Culture, blood (Routine X 2) w Reflex to ID Panel     Status: None   Collection Time: 08/10/19  9:44 AM   Specimen: BLOOD  Result Value Ref Range Status   Specimen Description   Final    BLOOD RIGHT ANTECUBITAL Performed at [place] [address], [place], [place] [address]    Special Requests   Final    BOTTLES DRAWN AEROBIC AND ANAEROBIC Blood Culture adequate volume Performed at [facility] [address], [place], [place] [address]    Culture   Final    NO GROWTH 5 DAYS Performed at  [facility], [place] [address] [place], [place] [address]    Report Status 08/15/2019 FINAL  Final  Culture, Urine     Status: None   Collection Time: 08/10/19  9:59 AM   Specimen: Urine, Catheterized  Result Value Ref Range Status   Specimen Description   Final    URINE, CATHETERIZED Performed at [facility] [address] [place], [place] [address]    Special Requests   Final    NONE Performed at [facility], [place] [address], [place], [place] [address]    Culture   Final    NO GROWTH Performed at [facility], [place] [address] [place], [place] [address]    Report Status 08/11/2019 FINAL  Final  Culture, Urine     Status: None   Collection Time: 08/14/19 12:29 PM   Specimen: Urine, Catheterized  Result Value Ref Range Status   Specimen Description   Final    URINE, CATHETERIZED Performed at [facility] [address], [place], [place] [address]    Special Requests   Final    Normal Performed at [facility], [place] [address], [place], [place] [address]    Culture   Final    NO GROWTH Performed at [facility], [place] [address], [place], [place] [address]    Report Status 08/15/2019 FINAL  Final     Labs: BNP (last 3 results) Recent Labs    08/04/19 1102 08/07/19 0809  BNP 31.2 673.4*   Basic Metabolic Panel: Recent Labs  Lab 08/11/19 0407 08/12/19 0424 08/13/19 0714 08/14/19 0837 08/16/19 0948  NA 147* 148* 149* 146* 139  K 4.4 4.6 4.0 4.4 4.0  CL 108 112* 114* 114* 107  CO2 28 26 25 26 23   GLUCOSE 387* 191* 164* 168* 163*  BUN 110* 84* 59* 58* 27*  CREATININE 3.11* 2.37* 1.91* 1.95* 1.48*  CALCIUM 8.8* 8.5* 8.2* 8.4* 7.7*  MG 3.5* 3.0*  --   --   --   PHOS 5.8*  --   --   --   --    Liver Function Tests: Recent Labs  Lab 08/11/19 0407 08/12/19 0424 08/16/19 0948  AST  --  61* 35  ALT  --  102* 46*  ALKPHOS  --  61 46  BILITOT  --  1.7* 0.8  PROT  --  5.9* 4.8*  ALBUMIN 3.2* 2.9* 2.3*   No results for input(s): LIPASE, AMYLASE in the last 168 hours. No results for input(s): AMMONIA in the last 168 hours. CBC: Recent Labs  Lab 08/11/19 0407 08/16/19 0948  WBC 13.8* 10.4  NEUTROABS 11.8*  --   HGB 15.1 11.1*  HCT 47.7 35.1*  MCV 89.0 89.5  PLT 186 128*   Cardiac Enzymes: No results for input(s): CKTOTAL, CKMB, CKMBINDEX, TROPONINI in the last 168 hours. BNP: Invalid input(s):  POCBNP CBG: Recent Labs  Lab 08/16/19 0553 08/16/19 0751 08/16/19 1222 08/16/19 2220 08/17/19 0810  GLUCAP 167* 186* 95 191* 100*   D-Dimer No results for input(s): DDIMER in the last 72 hours. Hgb A1c No results for input(s): HGBA1C in the last 72 hours. Lipid Profile No results for input(s): CHOL, HDL, LDLCALC, TRIG, CHOLHDL, LDLDIRECT in the last 72 hours. Thyroid function studies No results for input(s): TSH, T4TOTAL, T3FREE, THYROIDAB in the last 72 hours.  Invalid input(s): FREET3 Anemia work up No results for input(s): VITAMINB12, FOLATE, FERRITIN, TIBC, IRON,  RETICCTPCT in the last 72 hours. Urinalysis    Component Value Date/Time   COLORURINE YELLOW 08/14/2019 1228   APPEARANCEUR CLOUDY (A) 08/14/2019 1228   LABSPEC 1.016 08/14/2019 1228   PHURINE 5.0 08/14/2019 1228   GLUCOSEU >=500 (A) 08/14/2019 1228   HGBUR SMALL (A) 08/14/2019 1228   BILIRUBINUR NEGATIVE 08/14/2019 1228   KETONESUR NEGATIVE 08/14/2019 1228   PROTEINUR NEGATIVE 08/14/2019 1228   NITRITE NEGATIVE 08/14/2019 1228   LEUKOCYTESUR NEGATIVE 08/14/2019 1228   Sepsis Labs Invalid input(s): PROCALCITONIN,  WBC,  LACTICIDVEN Microbiology Recent Results (from the past 240 hour(s))  Culture, Urine     Status: None   Collection Time: 08/07/19  4:36 PM   Specimen: Urine, Clean Catch  Result Value Ref Range Status   Specimen Description   Final    URINE, CLEAN CATCH Performed at [facility], [place] [address], [place], [place] [address]    Special Requests   Final    NONE Performed at [facility], [place] [address], [place], [place] [address]    Culture   Final    NO GROWTH Performed at [facility], [place] [address] [place], [place] [address]    Report Status 08/09/2019 FINAL  Final  Culture, blood (Routine X 2) w Reflex to ID Panel     Status: None   Collection Time: 08/10/19  9:44 AM   Specimen: BLOOD  Result Value Ref Range Status   Specimen  Description   Final    BLOOD LEFT ANTECUBITAL Performed at [facility] [address] [place], [place] [address]    Special Requests   Final    BOTTLES DRAWN AEROBIC AND ANAEROBIC Blood Culture adequate volume Performed at [facility] [address] [place], [place] [address]    Culture   Final    NO GROWTH 5 DAYS Performed at [facility], [place] [address] [place], [place] [address]    Report Status 08/15/2019 FINAL  Final  Culture, blood (Routine X 2) w Reflex to ID Panel     Status: None   Collection Time: 08/10/19  9:44 AM   Specimen: BLOOD  Result Value Ref Range Status   Specimen Description   Final    BLOOD RIGHT ANTECUBITAL Performed at [facility] [address], [place], [place] [address]    Special Requests   Final    BOTTLES DRAWN AEROBIC AND ANAEROBIC Blood Culture adequate volume Performed at [facility] [address], [place], [place] [address]    Culture   Final    NO GROWTH 5 DAYS Performed at [facility], [place] [address] [place], [place] [address]    Report Status 08/15/2019 FINAL  Final  Culture, Urine     Status: None   Collection Time: 08/10/19  9:59 AM   Specimen: Urine, Catheterized  Result Value Ref Range Status   Specimen Description   Final    URINE, CATHETERIZED Performed at [facility] [address] [place], [place] [address]    Special Requests   Final    NONE Performed at [facility], [place] [address], [place],  [address]    Culture   Final    NO GROWTH Performed at [facility], [place] [address], [place],  [address]    Report Status 08/11/2019 FINAL  Final  Culture, Urine     Status: None   Collection  Time: 08/14/19 12:29 PM   Specimen: Urine, Catheterized  Result Value Ref Range Status   Specimen Description   Final    URINE, CATHETERIZED Performed at [facility] [address], [place], [place] [address]    Special Requests   Final    Normal Performed at [facility], [place] [address] [place], [place] [address]    Culture   Final    NO GROWTH Performed at [facility], [place] [address] [place], [place] [address]    Report Status 08/15/2019 FINAL  Final     Time coordinating discharge:  39 minutes  SIGNED:   Georgette Shell, MD  [facility] 08/17/2019, 10:16 AM Pager   If 7PM-7AM, please contact night-coverage [email or web address] Password [email or web address]

## 2019-08-18 ENCOUNTER — Encounter (HOSPITAL_COMMUNITY): Payer: Self-pay

## 2019-08-18 ENCOUNTER — Emergency Department (HOSPITAL_COMMUNITY)
Admission: EM | Admit: 2019-08-18 | Discharge: 2019-08-18 | Disposition: A | Discharge status: Home / Self Care | Payer: 59 | Attending: Emergency Medicine | Admitting: Emergency Medicine

## 2019-08-18 ENCOUNTER — Other Ambulatory Visit: Payer: Self-pay

## 2019-08-18 DIAGNOSIS — R1110 Vomiting, unspecified: Secondary | ICD-10-CM | POA: Diagnosis present

## 2019-08-18 DIAGNOSIS — I129 Hypertensive chronic kidney disease with stage 1 through stage 4 chronic kidney disease, or unspecified chronic kidney disease: Secondary | ICD-10-CM | POA: Diagnosis not present

## 2019-08-18 DIAGNOSIS — E1122 Type 2 diabetes mellitus with diabetic chronic kidney disease: Secondary | ICD-10-CM | POA: Insufficient documentation

## 2019-08-18 DIAGNOSIS — U071 COVID-19: Secondary | ICD-10-CM | POA: Insufficient documentation

## 2019-08-18 DIAGNOSIS — Z951 Presence of aortocoronary bypass graft: Secondary | ICD-10-CM | POA: Diagnosis not present

## 2019-08-18 DIAGNOSIS — N184 Chronic kidney disease, stage 4 (severe): Secondary | ICD-10-CM | POA: Insufficient documentation

## 2019-08-18 DIAGNOSIS — Z7982 Long term (current) use of aspirin: Secondary | ICD-10-CM | POA: Diagnosis not present

## 2019-08-18 DIAGNOSIS — Z79899 Other long term (current) drug therapy: Secondary | ICD-10-CM | POA: Diagnosis not present

## 2019-08-18 DIAGNOSIS — Z794 Long term (current) use of insulin: Secondary | ICD-10-CM | POA: Insufficient documentation

## 2019-08-18 DIAGNOSIS — I2510 Atherosclerotic heart disease of native coronary artery without angina pectoris: Secondary | ICD-10-CM | POA: Diagnosis not present

## 2019-08-18 LAB — CBC WITH DIFFERENTIAL/PLATELET
Abs Immature Granulocytes: 0.2 10*3/uL — ABNORMAL HIGH (ref 0.00–0.07)
Basophils Absolute: 0 10*3/uL (ref 0.0–0.1)
Basophils Relative: 0 %
Eosinophils Absolute: 0 10*3/uL (ref 0.0–0.5)
Eosinophils Relative: 0 %
HCT: 40.3 % (ref 39.0–52.0)
Hemoglobin: 13.2 g/dL (ref 13.0–17.0)
Immature Granulocytes: 1 %
Lymphocytes Relative: 2 %
Lymphs Abs: 0.4 10*3/uL — ABNORMAL LOW (ref 0.7–4.0)
MCH: 28.7 pg (ref 26.0–34.0)
MCHC: 32.8 g/dL (ref 30.0–36.0)
MCV: 87.6 fL (ref 80.0–100.0)
Monocytes Absolute: 1.1 10*3/uL — ABNORMAL HIGH (ref 0.1–1.0)
Monocytes Relative: 5 %
Neutro Abs: 19.1 10*3/uL — ABNORMAL HIGH (ref 1.7–7.7)
Neutrophils Relative %: 92 %
Platelets: 157 10*3/uL (ref 150–400)
RBC: 4.6 MIL/uL (ref 4.22–5.81)
RDW: 13.4 % (ref 11.5–15.5)
WBC: 20.8 10*3/uL — ABNORMAL HIGH (ref 4.0–10.5)
nRBC: 0 % (ref 0.0–0.2)

## 2019-08-18 LAB — CBG MONITORING, ED: Glucose-Capillary: 216 mg/dL — ABNORMAL HIGH (ref 70–99)

## 2019-08-18 LAB — COMPREHENSIVE METABOLIC PANEL
ALT: 61 U/L — ABNORMAL HIGH (ref 0–44)
AST: 54 U/L — ABNORMAL HIGH (ref 15–41)
Albumin: 2.9 g/dL — ABNORMAL LOW (ref 3.5–5.0)
Alkaline Phosphatase: 57 U/L (ref 38–126)
Anion gap: 15 (ref 5–15)
BUN: 35 mg/dL — ABNORMAL HIGH (ref 8–23)
CO2: 23 mmol/L (ref 22–32)
Calcium: 8.7 mg/dL — ABNORMAL LOW (ref 8.9–10.3)
Chloride: 105 mmol/L (ref 98–111)
Creatinine, Ser: 1.91 mg/dL — ABNORMAL HIGH (ref 0.61–1.24)
GFR calc Af Amer: 42 mL/min — ABNORMAL LOW (ref >60)
GFR calc non Af Amer: 36 mL/min — ABNORMAL LOW (ref >60)
Glucose, Bld: 230 mg/dL — ABNORMAL HIGH (ref 70–99)
Potassium: 4.1 mmol/L (ref 3.5–5.1)
Sodium: 143 mmol/L (ref 135–145)
Total Bilirubin: 1.2 mg/dL (ref 0.3–1.2)
Total Protein: 6.1 g/dL — ABNORMAL LOW (ref 6.5–8.1)

## 2019-08-18 LAB — URINALYSIS, ROUTINE W REFLEX MICROSCOPIC
Bilirubin Urine: NEGATIVE
Glucose, UA: >=500 mg/dL — AB
Ketones, ur: 20 mg/dL — AB
Leukocytes,Ua: NEGATIVE
Nitrite: NEGATIVE
Protein, ur: 30 mg/dL — AB
Specific Gravity, Urine: 1.018 (ref 1.005–1.030)
pH: 5 (ref 5.0–8.0)

## 2019-08-18 LAB — BRAIN NATRIURETIC PEPTIDE: B Natriuretic Peptide: 155.4 pg/mL — ABNORMAL HIGH (ref 0.0–100.0)

## 2019-08-18 LAB — ETHANOL: Alcohol, Ethyl (B): <10 mg/dL (ref <10)

## 2019-08-18 LAB — LIPASE, BLOOD: Lipase: 51 U/L (ref 11–51)

## 2019-08-18 LAB — LACTIC ACID, PLASMA: Lactic Acid, Venous: 2.2 mmol/L (ref 0.5–1.9)

## 2019-08-18 MED ORDER — PROCHLORPERAZINE MALEATE 10 MG PO TABS: 10.0000 mg | ORAL_TABLET | Freq: Two times a day (BID) | ORAL | 0 refills | Status: DC | PRN

## 2019-08-18 MED ORDER — SODIUM CHLORIDE 0.9 % IV SOLN: INTRAVENOUS | Status: DC

## 2019-08-18 MED ORDER — ONDANSETRON HCL 4 MG/2ML IJ SOLN
4.0000 mg | Freq: Once | INTRAMUSCULAR | Status: AC
  Administered 2019-08-18: 13:00:00 4 mg via INTRAVENOUS
  Filled 2019-08-18: qty 2

## 2019-08-18 NOTE — ED Notes (Signed)
Brother Zachariah Pavek) [phone], is unable to pick up patient until between 430-5p. Charge RN made aware.

## 2019-08-18 NOTE — ED Triage Notes (Signed)
Pt reports he tested positive for COVID last week. Pt states he has been vomiting x3 weeks. Pt denies abdominal pain, fever, and diarrhea. Pt has hx of diabetes.

## 2019-08-18 NOTE — ED Provider Notes (Signed)
[facility] DEPT Provider Note   CSN: [id number] Arrival date & time: 08/18/19  1159     History Chief Complaint  Patient presents with  . COVID Positive  . Emesis    Lucas Hobbs is a 64 y.o. male.  HPI    Patient presents 1 day after being discharged following admission for pneumonia secondary to coronavirus now with concern for nausea and vomiting. Is unclear if the patient felt well on discharge, but he notes that since yesterday he has had nausea, vomiting.  He denies new pain.  He denies new dyspnea. It is unclear if this is a new medication for relief. History obtained by patient and chart review.  Is clear the patient was discharged yesterday after hospitalization for coronavirus infection.  Past Medical History:  Diagnosis Date  . Asperger's syndrome   . Chronic kidney disease    CKD Stage 3 05/08/2017  . Coronary artery disease   . Diabetes mellitus without complication (HCC)    IDDM  . Diabetic retinopathy ([place])   . Diabetic retinopathy ([place])   . Diabetic retinopathy ([place]) 04/17/2017  . Hyperlipidemia    mixed  . Hypertension   . NSVT (nonsustained ventricular tachycardia) ([place])   . Pulmonary nodule 03/12/2017    Patient Active Problem List   Diagnosis Date Noted  . Pressure injury of skin 08/14/2019  . Pneumonia due to COVID-19 virus   . Hypoglycemia 08/04/2019  . CKD (chronic kidney disease), stage IV ([place]) 08/04/2019  . Nausea & vomiting 08/04/2019  . Essential hypertension 08/04/2019  . DM (diabetes mellitus), type 2 with renal complications ([place]) 39/76/7341  . CAD (coronary artery disease) 08/04/2019  . COVID-19 08/04/2019  . Diabetic retinopathy [facility])     Past Surgical History:  Procedure Laterality Date  . CARDIAC CATHETERIZATION    . COLONOSCOPY W/ BIOPSIES  08/10/2016  . CORONARY ARTERY BYPASS GRAFT     CABG x2; Performed at [facility] 05/07/17  . RADICAL ORCHIECTOMY         Family History  Problem Relation  Age of Onset  . CVA Father   . Heart attack Maternal Uncle   . Heart attack Paternal Uncle     Social History   Tobacco Use  . Smoking status: Never Smoker  . Smokeless tobacco: Never Used  Substance Use Topics  . Alcohol use: Not Currently  . Drug use: Never    Home Medications Prior to Admission medications   Medication Sig Start Date End Date Taking? Authorizing Provider  amLODipine (NORVASC) 10 MG tablet Take 10 mg by mouth daily. 08/06/17   [provider]  ascorbic acid (VITAMIN C) 500 MG tablet Take 1 tablet (500 mg total) by mouth daily. 08/17/19   Georgette Shell, MD  aspirin 81 MG chewable tablet Chew 81 mg by mouth daily. 02/15/13   [provider]  atorvastatin (LIPITOR) 40 MG tablet Take 40 mg by mouth every evening.  08/06/17   [provider]  insulin NPH-regular Human (70-30) 100 UNIT/ML injection Inject 12 Units into the skin 2 (two) times daily with a meal. 38 in the morning and 36 in the evening 08/17/19   Georgette Shell, MD  metoprolol succinate (TOPROL-XL) 100 MG 24 hr tablet Take 0.5 tablets (50 mg total) by mouth every evening. 08/17/19   Georgette Shell, MD  tamsulosin (FLOMAX) 0.4 MG CAPS capsule Take 1 capsule (0.4 mg total) by mouth daily. 08/17/19   Georgette Shell, MD  zinc  sulfate 220 (50 Zn) MG capsule Take 1 capsule (220 mg total) by mouth daily. 08/17/19   Georgette Shell, MD    Allergies    Iodine  Review of Systems   Review of Systems  Constitutional:       Per HPI, otherwise negative  HENT:       Per HPI, otherwise negative  Respiratory:       Per HPI, otherwise negative  Cardiovascular:       Per HPI, otherwise negative  Gastrointestinal: Positive for nausea and vomiting.  Endocrine:       Negative aside from HPI  Genitourinary:       Neg aside from HPI   Musculoskeletal:       Per HPI, otherwise negative  Skin: Negative.   Neurological: Positive for weakness. Negative for syncope.     Physical Exam Updated Vital Signs BP (!) 186/76   Pulse 84   Temp 98.2 F (36.8 C) (Oral)   Resp 19   Ht 5\' 7"  (1.702 m)   Wt 70.3 kg   SpO2 95%   BMI 24.28 kg/m   Physical Exam Vitals and nursing note reviewed.  Constitutional:      General: He is not in acute distress.    Appearance: He is well-developed.  HENT:     Head: Normocephalic and atraumatic.  Eyes:     Conjunctiva/sclera: Conjunctivae normal.  Cardiovascular:     Rate and Rhythm: Normal rate and regular rhythm.  Pulmonary:     Effort: Pulmonary effort is normal. No respiratory distress.     Breath sounds: No stridor.  Abdominal:     General: There is no distension.  Skin:    General: Skin is warm and dry.  Neurological:     Mental Status: He is alert and oriented to person, place, and time.     ED Results / Procedures / Treatments   Labs (all labs ordered are listed, but only abnormal results are displayed) Labs Reviewed  COMPREHENSIVE METABOLIC PANEL - Abnormal; Notable for the following components:      Result Value   Glucose, Bld 230 (*)    BUN 35 (*)    Creatinine, Ser 1.91 (*)    Calcium 8.7 (*)    Total Protein 6.1 (*)    Albumin 2.9 (*)    AST 54 (*)    ALT 61 (*)    GFR calc non Af Amer 36 (*)    GFR calc Af Amer 42 (*)    All other components within normal limits  CBC WITH DIFFERENTIAL/PLATELET - Abnormal; Notable for the following components:   WBC 20.8 (*)    Neutro Abs 19.1 (*)    Lymphs Abs 0.4 (*)    Monocytes Absolute 1.1 (*)    Abs Immature Granulocytes 0.20 (*)    All other components within normal limits  URINALYSIS, ROUTINE W REFLEX MICROSCOPIC - Abnormal; Notable for the following components:   APPearance CLOUDY (*)    Glucose, UA >=500 (*)    Hgb urine dipstick SMALL (*)    Ketones, ur 20 (*)    Protein, ur 30 (*)    Bacteria, UA RARE (*)    All other components within normal limits  LACTIC ACID, PLASMA - Abnormal; Notable for the following components:    Lactic Acid, Venous 2.2 (*)    All other components within normal limits  BRAIN NATRIURETIC PEPTIDE - Abnormal; Notable for the following components:   B Natriuretic Peptide  155.4 (*)    All other components within normal limits  CBG MONITORING, ED - Abnormal; Notable for the following components:   Glucose-Capillary 216 (*)    All other components within normal limits  LIPASE, BLOOD  ETHANOL    Procedures Procedures (including critical care time)  Medications Ordered in ED Medications  0.9 %  sodium chloride infusion ( Intravenous New Bag/Given (Non-Interop) 08/18/19 1243)  ondansetron (ZOFRAN) injection 4 mg (4 mg Intravenous Given 08/18/19 1244)    ED Course  I have reviewed the triage vital signs and the nursing notes.  Pertinent labs & imaging results that were available during my care of the patient were reviewed by me and considered in my medical decision making (see chart for details).    MDM Rules/Calculators/A&P                      3:09 PM Patient awake, alert, sitting upright, in no distress, speaking clearly, no ongoing vomiting. Labs notable for slight dehydration, and he has received fluid resuscitation as well as antiemetics.  Patient has a slight elevation in creatinine, though this is similar to that several days prior to ED discharge.  Absent vomiting, new pain, fever, no evidence for decompensated disease, now that he is improved here, patient will be discharged, with outpatient resources provided yesterday for appropriate ongoing Covid follow-up care.  Patient will receive new antiemetics on discharge. Final Clinical Impression(s) / ED Diagnoses Final diagnoses:  [id number]    Rx / DC Orders ED Discharge Orders         Ordered    prochlorperazine (COMPAZINE) 10 MG tablet  2 times daily PRN     08/18/19 1514           Carmin Muskrat, MD 08/18/19 1514

## 2019-08-18 NOTE — Discharge Instructions (Signed)
As discussed, today's evaluation has been generally reassuring. There are some evidence for mild dehydration, but labs are otherwise consistent with those prior to your departure from the hospital yesterday.  It is very important that you stay well-hydrated, and monitor your condition carefully.  Return here for any concerning changes in your condition.

## 2019-08-22 ENCOUNTER — Other Ambulatory Visit: Payer: Self-pay

## 2019-08-22 ENCOUNTER — Emergency Department (HOSPITAL_COMMUNITY): Payer: 59

## 2019-08-22 ENCOUNTER — Other Ambulatory Visit (HOSPITAL_COMMUNITY): Payer: Self-pay

## 2019-08-22 ENCOUNTER — Inpatient Hospital Stay (HOSPITAL_COMMUNITY)
Admission: EM | Admit: 2019-08-22 | Discharge: 2019-08-26 | DRG: 193 | Disposition: A | Discharge status: Skilled Nursing Facility | Payer: 59 | Attending: Internal Medicine | Admitting: Internal Medicine

## 2019-08-22 ENCOUNTER — Inpatient Hospital Stay (HOSPITAL_COMMUNITY): Payer: 59

## 2019-08-22 ENCOUNTER — Encounter (HOSPITAL_COMMUNITY): Payer: Self-pay | Admitting: Emergency Medicine

## 2019-08-22 DIAGNOSIS — E11649 Type 2 diabetes mellitus with hypoglycemia without coma: Secondary | ICD-10-CM | POA: Diagnosis present

## 2019-08-22 DIAGNOSIS — Z823 Family history of stroke: Secondary | ICD-10-CM

## 2019-08-22 DIAGNOSIS — R339 Retention of urine, unspecified: Secondary | ICD-10-CM | POA: Diagnosis present

## 2019-08-22 DIAGNOSIS — J159 Unspecified bacterial pneumonia: Secondary | ICD-10-CM | POA: Diagnosis not present

## 2019-08-22 DIAGNOSIS — Z8673 Personal history of transient ischemic attack (TIA), and cerebral infarction without residual deficits: Secondary | ICD-10-CM

## 2019-08-22 DIAGNOSIS — Z79899 Other long term (current) drug therapy: Secondary | ICD-10-CM

## 2019-08-22 DIAGNOSIS — R338 Other retention of urine: Secondary | ICD-10-CM | POA: Diagnosis present

## 2019-08-22 DIAGNOSIS — U071 COVID-19: Secondary | ICD-10-CM | POA: Diagnosis not present

## 2019-08-22 DIAGNOSIS — Z8616 Personal history of COVID-19: Secondary | ICD-10-CM

## 2019-08-22 DIAGNOSIS — Z9079 Acquired absence of other genital organ(s): Secondary | ICD-10-CM

## 2019-08-22 DIAGNOSIS — T383X5A Adverse effect of insulin and oral hypoglycemic [antidiabetic] drugs, initial encounter: Secondary | ICD-10-CM | POA: Diagnosis present

## 2019-08-22 DIAGNOSIS — E1122 Type 2 diabetes mellitus with diabetic chronic kidney disease: Secondary | ICD-10-CM | POA: Diagnosis present

## 2019-08-22 DIAGNOSIS — E1136 Type 2 diabetes mellitus with diabetic cataract: Secondary | ICD-10-CM | POA: Diagnosis present

## 2019-08-22 DIAGNOSIS — Z951 Presence of aortocoronary bypass graft: Secondary | ICD-10-CM

## 2019-08-22 DIAGNOSIS — R9431 Abnormal electrocardiogram [ECG] [EKG]: Secondary | ICD-10-CM | POA: Diagnosis present

## 2019-08-22 DIAGNOSIS — F0150 Vascular dementia without behavioral disturbance: Secondary | ICD-10-CM | POA: Diagnosis present

## 2019-08-22 DIAGNOSIS — I248 Other forms of acute ischemic heart disease: Secondary | ICD-10-CM | POA: Diagnosis present

## 2019-08-22 DIAGNOSIS — N390 Urinary tract infection, site not specified: Secondary | ICD-10-CM | POA: Diagnosis present

## 2019-08-22 DIAGNOSIS — J1282 Pneumonia due to coronavirus disease 2019: Secondary | ICD-10-CM | POA: Diagnosis present

## 2019-08-22 DIAGNOSIS — I4581 Long QT syndrome: Secondary | ICD-10-CM | POA: Diagnosis present

## 2019-08-22 DIAGNOSIS — J9601 Acute respiratory failure with hypoxia: Secondary | ICD-10-CM

## 2019-08-22 DIAGNOSIS — E11319 Type 2 diabetes mellitus with unspecified diabetic retinopathy without macular edema: Secondary | ICD-10-CM | POA: Diagnosis present

## 2019-08-22 DIAGNOSIS — E160 Drug-induced hypoglycemia without coma: Secondary | ICD-10-CM | POA: Diagnosis not present

## 2019-08-22 DIAGNOSIS — F845 Asperger's syndrome: Secondary | ICD-10-CM | POA: Diagnosis present

## 2019-08-22 DIAGNOSIS — I129 Hypertensive chronic kidney disease with stage 1 through stage 4 chronic kidney disease, or unspecified chronic kidney disease: Secondary | ICD-10-CM | POA: Diagnosis present

## 2019-08-22 DIAGNOSIS — Z8249 Family history of ischemic heart disease and other diseases of the circulatory system: Secondary | ICD-10-CM

## 2019-08-22 DIAGNOSIS — Z7982 Long term (current) use of aspirin: Secondary | ICD-10-CM

## 2019-08-22 DIAGNOSIS — N1832 Chronic kidney disease, stage 3b: Secondary | ICD-10-CM | POA: Diagnosis present

## 2019-08-22 DIAGNOSIS — I69311 Memory deficit following cerebral infarction: Secondary | ICD-10-CM

## 2019-08-22 DIAGNOSIS — I2510 Atherosclerotic heart disease of native coronary artery without angina pectoris: Secondary | ICD-10-CM | POA: Diagnosis present

## 2019-08-22 DIAGNOSIS — E785 Hyperlipidemia, unspecified: Secondary | ICD-10-CM | POA: Diagnosis present

## 2019-08-22 DIAGNOSIS — G9341 Metabolic encephalopathy: Secondary | ICD-10-CM | POA: Diagnosis present

## 2019-08-22 DIAGNOSIS — E162 Hypoglycemia, unspecified: Principal | ICD-10-CM

## 2019-08-22 DIAGNOSIS — B948 Sequelae of other specified infectious and parasitic diseases: Secondary | ICD-10-CM

## 2019-08-22 DIAGNOSIS — Z8546 Personal history of malignant neoplasm of prostate: Secondary | ICD-10-CM

## 2019-08-22 DIAGNOSIS — Z794 Long term (current) use of insulin: Secondary | ICD-10-CM

## 2019-08-22 DIAGNOSIS — N401 Enlarged prostate with lower urinary tract symptoms: Secondary | ICD-10-CM | POA: Diagnosis present

## 2019-08-22 DIAGNOSIS — Y95 Nosocomial condition: Secondary | ICD-10-CM | POA: Diagnosis present

## 2019-08-22 DIAGNOSIS — R112 Nausea with vomiting, unspecified: Secondary | ICD-10-CM | POA: Diagnosis present

## 2019-08-22 DIAGNOSIS — R7401 Elevation of levels of liver transaminase levels: Secondary | ICD-10-CM | POA: Diagnosis present

## 2019-08-22 DIAGNOSIS — R001 Bradycardia, unspecified: Secondary | ICD-10-CM | POA: Diagnosis not present

## 2019-08-22 DIAGNOSIS — N183 Chronic kidney disease, stage 3 unspecified: Secondary | ICD-10-CM | POA: Diagnosis present

## 2019-08-22 HISTORY — DX: Malignant neoplasm of unspecified testis, unspecified whether descended or undescended: C62.90

## 2019-08-22 LAB — COMPREHENSIVE METABOLIC PANEL
ALT: 46 U/L — ABNORMAL HIGH (ref 0–44)
AST: 39 U/L (ref 15–41)
Albumin: 2.3 g/dL — ABNORMAL LOW (ref 3.5–5.0)
Alkaline Phosphatase: 58 U/L (ref 38–126)
Anion gap: 10 (ref 5–15)
BUN: 31 mg/dL — ABNORMAL HIGH (ref 8–23)
CO2: 27 mmol/L (ref 22–32)
Calcium: 8.3 mg/dL — ABNORMAL LOW (ref 8.9–10.3)
Chloride: 109 mmol/L (ref 98–111)
Creatinine, Ser: 1.64 mg/dL — ABNORMAL HIGH (ref 0.61–1.24)
GFR calc Af Amer: 51 mL/min — ABNORMAL LOW (ref >60)
GFR calc non Af Amer: 44 mL/min — ABNORMAL LOW (ref >60)
Glucose, Bld: 84 mg/dL (ref 70–99)
Potassium: 3.9 mmol/L (ref 3.5–5.1)
Sodium: 146 mmol/L — ABNORMAL HIGH (ref 135–145)
Total Bilirubin: 1.1 mg/dL (ref 0.3–1.2)
Total Protein: 5.6 g/dL — ABNORMAL LOW (ref 6.5–8.1)

## 2019-08-22 LAB — CBC WITH DIFFERENTIAL/PLATELET
Abs Immature Granulocytes: 0.07 10*3/uL (ref 0.00–0.07)
Basophils Absolute: 0 10*3/uL (ref 0.0–0.1)
Basophils Relative: 0 %
Eosinophils Absolute: 0 10*3/uL (ref 0.0–0.5)
Eosinophils Relative: 0 %
HCT: 39.8 % (ref 39.0–52.0)
Hemoglobin: 12.5 g/dL — ABNORMAL LOW (ref 13.0–17.0)
Immature Granulocytes: 1 %
Lymphocytes Relative: 2 %
Lymphs Abs: 0.3 10*3/uL — ABNORMAL LOW (ref 0.7–4.0)
MCH: 28.2 pg (ref 26.0–34.0)
MCHC: 31.4 g/dL (ref 30.0–36.0)
MCV: 89.6 fL (ref 80.0–100.0)
Monocytes Absolute: 0.7 10*3/uL (ref 0.1–1.0)
Monocytes Relative: 5 %
Neutro Abs: 13.9 10*3/uL — ABNORMAL HIGH (ref 1.7–7.7)
Neutrophils Relative %: 92 %
Platelets: 207 10*3/uL (ref 150–400)
RBC: 4.44 MIL/uL (ref 4.22–5.81)
RDW: 13.9 % (ref 11.5–15.5)
WBC: 14.9 10*3/uL — ABNORMAL HIGH (ref 4.0–10.5)
nRBC: 0 % (ref 0.0–0.2)

## 2019-08-22 LAB — URINALYSIS, ROUTINE W REFLEX MICROSCOPIC
Bilirubin Urine: NEGATIVE
Glucose, UA: 50 mg/dL — AB
Ketones, ur: NEGATIVE mg/dL
Leukocytes,Ua: NEGATIVE
Nitrite: NEGATIVE
Protein, ur: 100 mg/dL — AB
Specific Gravity, Urine: 1.018 (ref 1.005–1.030)
pH: 5 (ref 5.0–8.0)

## 2019-08-22 LAB — APTT: aPTT: 29 seconds (ref 24–36)

## 2019-08-22 LAB — CBG MONITORING, ED
Glucose-Capillary: 101 mg/dL — ABNORMAL HIGH (ref 70–99)
Glucose-Capillary: 16 mg/dL — CL (ref 70–99)
Glucose-Capillary: 143 mg/dL — ABNORMAL HIGH (ref 70–99)
Glucose-Capillary: 88 mg/dL (ref 70–99)

## 2019-08-22 LAB — TROPONIN I (HIGH SENSITIVITY)
Troponin I (High Sensitivity): 143 ng/L (ref <18)
Troponin I (High Sensitivity): 172 ng/L (ref <18)
Troponin I (High Sensitivity): 227 ng/L (ref <18)
Troponin I (High Sensitivity): 190 ng/L (ref <18)

## 2019-08-22 LAB — GLUCOSE, CAPILLARY
Glucose-Capillary: 130 mg/dL — ABNORMAL HIGH (ref 70–99)
Glucose-Capillary: 168 mg/dL — ABNORMAL HIGH (ref 70–99)
Glucose-Capillary: 238 mg/dL — ABNORMAL HIGH (ref 70–99)
Glucose-Capillary: 263 mg/dL — ABNORMAL HIGH (ref 70–99)

## 2019-08-22 LAB — HEPARIN LEVEL (UNFRACTIONATED): Heparin Unfractionated: 0.86 IU/mL — ABNORMAL HIGH (ref 0.30–0.70)

## 2019-08-22 LAB — PROTIME-INR
INR: 1.3 — ABNORMAL HIGH (ref 0.8–1.2)
Prothrombin Time: 16.4 seconds — ABNORMAL HIGH (ref 11.4–15.2)

## 2019-08-22 LAB — LACTIC ACID, PLASMA
Lactic Acid, Venous: 1.8 mmol/L (ref 0.5–1.9)
Lactic Acid, Venous: 1.2 mmol/L (ref 0.5–1.9)

## 2019-08-22 LAB — PROCALCITONIN: Procalcitonin: 0.52 ng/mL

## 2019-08-22 MED ORDER — AMLODIPINE BESYLATE 10 MG PO TABS
10.0000 mg | ORAL_TABLET | Freq: Every day | ORAL | Status: DC
  Administered 2019-08-22 – 2019-08-26 (×5): 10 mg via ORAL
  Filled 2019-08-22 (×5): qty 1

## 2019-08-22 MED ORDER — ACETAMINOPHEN 325 MG PO TABS: 650.0000 mg | ORAL_TABLET | Freq: Four times a day (QID) | ORAL | Status: DC | PRN

## 2019-08-22 MED ORDER — ACETAMINOPHEN 650 MG RE SUPP: 650.0000 mg | Freq: Four times a day (QID) | RECTAL | Status: DC | PRN

## 2019-08-22 MED ORDER — ALBUTEROL SULFATE (2.5 MG/3ML) 0.083% IN NEBU: 2.5000 mg | INHALATION_SOLUTION | RESPIRATORY_TRACT | Status: DC | PRN

## 2019-08-22 MED ORDER — DEXTROSE-NACL 5-0.45 % IV SOLN: INTRAVENOUS | Status: DC

## 2019-08-22 MED ORDER — DIPHENHYDRAMINE HCL 25 MG PO CAPS
50.0000 mg | ORAL_CAPSULE | Freq: Once | ORAL | Status: AC
  Administered 2019-08-22: 12:00:00 50 mg via ORAL
  Filled 2019-08-22 (×2): qty 2

## 2019-08-22 MED ORDER — ONDANSETRON HCL 4 MG/2ML IJ SOLN: 4.0000 mg | Freq: Four times a day (QID) | INTRAMUSCULAR | Status: DC | PRN

## 2019-08-22 MED ORDER — IOHEXOL 350 MG/ML SOLN
80.0000 mL | Freq: Once | INTRAVENOUS | Status: AC | PRN
  Administered 2019-08-22: 73 mL via INTRAVENOUS

## 2019-08-22 MED ORDER — ONDANSETRON HCL 4 MG PO TABS: 4.0000 mg | ORAL_TABLET | Freq: Four times a day (QID) | ORAL | Status: DC | PRN

## 2019-08-22 MED ORDER — HEPARIN (PORCINE) 25000 UT/250ML-% IV SOLN
1050.0000 [IU]/h | INTRAVENOUS | Status: DC
  Administered 2019-08-22: 1200 [IU]/h via INTRAVENOUS
  Administered 2019-08-23: 06:00:00 1050 [IU]/h via INTRAVENOUS
  Filled 2019-08-22 (×2): qty 250

## 2019-08-22 MED ORDER — TAMSULOSIN HCL 0.4 MG PO CAPS
0.4000 mg | ORAL_CAPSULE | Freq: Every day | ORAL | Status: DC
  Administered 2019-08-22 – 2019-08-24 (×3): 0.4 mg via ORAL
  Filled 2019-08-22 (×3): qty 1

## 2019-08-22 MED ORDER — SODIUM CHLORIDE 0.9% FLUSH
3.0000 mL | Freq: Two times a day (BID) | INTRAVENOUS | Status: DC
  Administered 2019-08-22 – 2019-08-26 (×8): 3 mL via INTRAVENOUS

## 2019-08-22 MED ORDER — SODIUM CHLORIDE 0.9 % IV SOLN
100.0000 mg | Freq: Two times a day (BID) | INTRAVENOUS | Status: DC
  Administered 2019-08-22 (×2): 100 mg via INTRAVENOUS
  Filled 2019-08-22 (×3): qty 100

## 2019-08-22 MED ORDER — GUAIFENESIN ER 600 MG PO TB12
600.0000 mg | ORAL_TABLET | Freq: Two times a day (BID) | ORAL | Status: DC
  Administered 2019-08-22 – 2019-08-26 (×9): 600 mg via ORAL
  Filled 2019-08-22 (×9): qty 1

## 2019-08-22 MED ORDER — HYDROCORTISONE NA SUCCINATE PF 250 MG IJ SOLR
200.0000 mg | Freq: Once | INTRAMUSCULAR | Status: AC
  Administered 2019-08-22: 200 mg via INTRAVENOUS
  Filled 2019-08-22: qty 200

## 2019-08-22 MED ORDER — ASPIRIN 81 MG PO CHEW
81.0000 mg | CHEWABLE_TABLET | Freq: Every day | ORAL | Status: DC
  Administered 2019-08-22 – 2019-08-26 (×5): 81 mg via ORAL
  Filled 2019-08-22 (×5): qty 1

## 2019-08-22 MED ORDER — DEXTROSE 50 % IV SOLN: 1.0000 | Freq: Once | INTRAVENOUS | Status: AC

## 2019-08-22 MED ORDER — HEPARIN BOLUS VIA INFUSION
4000.0000 [IU] | Freq: Once | INTRAVENOUS | Status: AC
  Administered 2019-08-22: 4000 [IU] via INTRAVENOUS
  Filled 2019-08-22: qty 4000

## 2019-08-22 MED ORDER — SCOPOLAMINE 1 MG/3DAYS TD PT72
1.0000 | MEDICATED_PATCH | TRANSDERMAL | Status: DC
  Administered 2019-08-22: 12:00:00 1.5 mg via TRANSDERMAL
  Filled 2019-08-22 (×2): qty 1

## 2019-08-22 MED ORDER — DEXTROSE 50 % IV SOLN
INTRAVENOUS | Status: AC
  Administered 2019-08-22: 50 mL via INTRAVENOUS
  Filled 2019-08-22: qty 50

## 2019-08-22 MED ORDER — DIPHENHYDRAMINE HCL 50 MG/ML IJ SOLN
50.0000 mg | Freq: Once | INTRAMUSCULAR | Status: AC
  Filled 2019-08-22: qty 1

## 2019-08-22 MED ORDER — ATORVASTATIN CALCIUM 40 MG PO TABS
40.0000 mg | ORAL_TABLET | Freq: Every evening | ORAL | Status: DC
  Administered 2019-08-22 – 2019-08-25 (×4): 40 mg via ORAL
  Filled 2019-08-22 (×4): qty 1

## 2019-08-22 MED ORDER — SCOPOLAMINE 1 MG/3DAYS TD PT72
1.0000 | MEDICATED_PATCH | TRANSDERMAL | Status: DC | PRN
  Filled 2019-08-22: qty 1

## 2019-08-22 MED ORDER — METOPROLOL SUCCINATE ER 50 MG PO TB24
50.0000 mg | ORAL_TABLET | Freq: Every evening | ORAL | Status: DC
  Administered 2019-08-22 – 2019-08-24 (×3): 50 mg via ORAL
  Filled 2019-08-22 (×3): qty 1

## 2019-08-22 MED ORDER — SODIUM CHLORIDE 0.9 % IV SOLN
2.0000 g | INTRAVENOUS | Status: DC
  Administered 2019-08-22: 09:00:00 2 g via INTRAVENOUS
  Filled 2019-08-22: qty 20

## 2019-08-22 NOTE — H&P (Addendum)
History and Physical    Lucas Hobbs YNW:[id number] DOB: October 09, 1955 DOA: 08/22/2019  Referring MD/NP/PA: Inda Merlin, MD PCP: Nelwyn Salisbury, PA-C  Patient coming from: Home via EMS  Chief Complaint: Unresponsiveness  I have personally briefly reviewed patient's old medical records in [place]   HPI: Lucas Hobbs is a 64 y.o. male with medical history significant of HTN, HLD, IDDM, CAD s/p CABG in 2019, CKD stage III, NSVT, and asperger syndrome presents after being found unresponsive at home.  History is obtained from the patient and in talks his wife over the phone.  Patient had just recently been hospitalized from 3/30-4/13, with acute respiratory failure secondary to Covid pneumonia, intractable nausea, vomiting, diarrhea, acute kidney injury, hypoglycemia, and hyperglycemia.  She reports that since being home he still not back to baseline.  His wife is out of state and reports that he is being cared for at home with his mother who is 3 years old.  She reports that he is still having memory lapses and cannot recall how to use a cell phone.  He reports that he has decreased appetite.  Notes associated symptoms of continued hiccups, nausea, vomiting, spitting up phlegm, and decreased vision.  At home he still been taking his insulin 20 to 32units twice daily and did now that it had been recommended to be decreased to 12 units twice daily.  Due to his decrease in vision he has had a difficult time giving himself insulin.  He was taken back to the emergency department 4 days ago for the symptoms, but was discharged home. Upon EMS arrival patient was reported to be unresponsive with guppy breathing.  Heart rates into the 150s, temperature 101 F, and CBG 68.  He was bagged and given D10 with improvement blood glucose to 228 temporarily.  ED Course: Upon admission into the emergency department patient was seen to be afebrile, pulse 128, respiration 20-23, and O2 saturations noted as low  as 77% on room air with improvement to 93% on a nonrebreather.  Labs significant for WBC 14.9, hemoglobin 12.5, sodium 146, BUN 33, creatinine 1.61, glucose noted as low as 16, ALT 46, troponin 143, and lactic acid 1.8.  Urinalysis was positive for many bacteria.  Patient was given an amp of D50 and started on a D5 drip.  Due to patient's hypoxia and tachycardia in setting of recent COVID-19 he was started on a heparin drip, given Benadryl, and 200 mg of hydrocortisone in efforts to obtain a CT angiogram of the chest to rule out the possibility of a pulmonary embolus.  TRH called to admit.  Review of Systems  Constitutional: Positive for malaise/fatigue. Negative for fever.  HENT: Negative for congestion.   Eyes: Positive for blurred vision. Negative for discharge.       Positive for change in vision  Respiratory: Positive for sputum production and shortness of breath.   Cardiovascular: Negative for chest pain and leg swelling.  Gastrointestinal: Positive for nausea and vomiting. Negative for melena.       Positive for hiccups  Genitourinary: Negative for dysuria and hematuria.  Musculoskeletal: Negative for falls.  Neurological: Negative for loss of consciousness.  Endo/Heme/Allergies: Positive for polydipsia.  Psychiatric/Behavioral: Positive for memory loss.    Past Medical History:  Diagnosis Date  . Asperger's syndrome   . Chronic kidney disease    CKD Stage 3 05/08/2017  . Coronary artery disease   . Diabetes mellitus without complication (HCC)    IDDM  . Diabetic  retinopathy ([place])   . Diabetic retinopathy ([place])   . Diabetic retinopathy ([place]) 04/17/2017  . Hyperlipidemia    mixed  . Hypertension   . NSVT (nonsustained ventricular tachycardia) ([place])   . Pulmonary nodule 03/12/2017    Past Surgical History:  Procedure Laterality Date  . CARDIAC CATHETERIZATION    . COLONOSCOPY W/ BIOPSIES  08/10/2016  . CORONARY ARTERY BYPASS GRAFT     CABG x2; Performed at [facility] 05/07/17  .  RADICAL ORCHIECTOMY       reports that he has never smoked. He has never used smokeless tobacco. He reports previous alcohol use. He reports that he does not use drugs.  Allergies  Allergen Reactions  . Iodine Hives    Family History  Problem Relation Age of Onset  . CVA Father   . Heart attack Maternal Uncle   . Heart attack Paternal Uncle     Prior to Admission medications   Medication Sig Start Date End Date Taking? Authorizing Provider  amLODipine (NORVASC) 10 MG tablet Take 10 mg by mouth daily. 08/06/17   [provider]  ascorbic acid (VITAMIN C) 500 MG tablet Take 1 tablet (500 mg total) by mouth daily. 08/17/19   Georgette Shell, MD  aspirin 81 MG chewable tablet Chew 81 mg by mouth daily. 02/15/13   [provider]  atorvastatin (LIPITOR) 40 MG tablet Take 40 mg by mouth every evening.  08/06/17   [provider]  insulin NPH-regular Human (70-30) 100 UNIT/ML injection Inject 12 Units into the skin 2 (two) times daily with a meal. 38 in the morning and 36 in the evening Patient taking differently: Inject 36-38 Units into the skin 2 (two) times daily with a meal. 38 in the morning and 36 in the evening  08/17/19   Georgette Shell, MD  metoprolol succinate (TOPROL-XL) 100 MG 24 hr tablet Take 0.5 tablets (50 mg total) by mouth every evening. 08/17/19   Georgette Shell, MD  prochlorperazine (COMPAZINE) 10 MG tablet Take 1 tablet (10 mg total) by mouth 2 (two) times daily as needed for nausea or vomiting. 08/18/19   Carmin Muskrat, MD  tamsulosin (FLOMAX) 0.4 MG CAPS capsule Take 1 capsule (0.4 mg total) by mouth daily. 08/17/19   Georgette Shell, MD  zinc sulfate 220 (50 Zn) MG capsule Take 1 capsule (220 mg total) by mouth daily. 08/17/19   Georgette Shell, MD    Physical Exam:  Constitutional:  Vitals:   08/22/19 0242 08/22/19 0243 08/22/19 0300 08/22/19 0330  BP: 110/71  133/88 137/76  Pulse: (!) 128  (!) 104 99  Resp: 20  20  (!) 23  Temp: 99 F (37.2 C)     TempSrc: Oral     SpO2: 95%  100% 100%  Weight:  70.3 kg    Height:  5\' 7"  (1.702 m)     Eyes: PERRL, lids and conjunctivae normal ENMT: Mucous membranes are moist. Posterior pharynx clear of any exudate or lesions.  Neck: normal, supple, no masses, no thyromegaly Respiratory: Patient currently on 3 L nasal cannula oxygen with rales appreciated throughout both lung fields. Cardiovascular: Regular rate and rhythm, no murmurs / rubs / gallops. No extremity edema. 2+ pedal pulses. No carotid bruits.  Abdomen: no tenderness, no masses palpated. No hepatosplenomegaly. Bowel sounds positive.  Foley catheter in place. Musculoskeletal: no clubbing / cyanosis. No joint deformity upper and lower extremities. Good ROM, no contractures. Normal muscle tone.  Skin: no rashes,  lesions, ulcers. No induration Neurologic: CN 2-12 grossly intact. Sensation intact, DTR normal. Strength 5/5 in all 4.  Psychiatric: Mild recent memory loss. Alert and oriented x person and situation.  Normal mood.     Labs on Admission: I have personally reviewed following labs and imaging studies  CBC: Recent Labs  Lab 08/16/19 0948 08/18/19 1219 08/22/19 0302  WBC 10.4 20.8* 14.9*  NEUTROABS  --  19.1* 13.9*  HGB 11.1* 13.2 12.5*  HCT 35.1* 40.3 39.8  MCV 89.5 87.6 89.6  PLT 128* 157 542   Basic Metabolic Panel: Recent Labs  Lab 08/16/19 0948 08/18/19 1219 08/22/19 0302  NA 139 143 146*  K 4.0 4.1 3.9  CL 107 105 109  CO2 23 23 27   GLUCOSE 163* 230* 84  BUN 27* 35* 31*  CREATININE 1.48* 1.91* 1.64*  CALCIUM 7.7* 8.7* 8.3*   GFR: Estimated Creatinine Clearance: 43.1 mL/min (A) (by C-G formula based on SCr of 1.64 mg/dL (H)). Liver Function Tests: Recent Labs  Lab 08/16/19 0948 08/18/19 1219 08/22/19 0302  AST 35 54* 39  ALT 46* 61* 46*  ALKPHOS 46 57 58  BILITOT 0.8 1.2 1.1  PROT 4.8* 6.1* 5.6*  ALBUMIN 2.3* 2.9* 2.3*   Recent Labs  Lab 08/18/19 1219    LIPASE 51   No results for input(s): AMMONIA in the last 168 hours. Coagulation Profile: Recent Labs  Lab 08/22/19 0302  INR 1.3*   Cardiac Enzymes: No results for input(s): CKTOTAL, CKMB, CKMBINDEX, TROPONINI in the last 168 hours. BNP (last 3 results) No results for input(s): PROBNP in the last 8760 hours. HbA1C: No results for input(s): HGBA1C in the last 72 hours. CBG: Recent Labs  Lab 08/18/19 1233 08/22/19 0237 08/22/19 0504 08/22/19 0532 08/22/19 0719  GLUCAP 216* 101* 16* 143* 88   Lipid Profile: No results for input(s): CHOL, HDL, LDLCALC, TRIG, CHOLHDL, LDLDIRECT in the last 72 hours. Thyroid Function Tests: No results for input(s): TSH, T4TOTAL, FREET4, T3FREE, THYROIDAB in the last 72 hours. Anemia Panel: No results for input(s): VITAMINB12, FOLATE, FERRITIN, TIBC, IRON, RETICCTPCT in the last 72 hours. Urine analysis:    Component Value Date/Time   COLORURINE YELLOW 08/22/2019 0529   APPEARANCEUR TURBID (A) 08/22/2019 0529   LABSPEC 1.018 08/22/2019 0529   PHURINE 5.0 08/22/2019 0529   GLUCOSEU 50 (A) 08/22/2019 0529   HGBUR MODERATE (A) 08/22/2019 0529   BILIRUBINUR NEGATIVE 08/22/2019 0529   KETONESUR NEGATIVE 08/22/2019 0529   PROTEINUR 100 (A) 08/22/2019 0529   NITRITE NEGATIVE 08/22/2019 0529   LEUKOCYTESUR NEGATIVE 08/22/2019 0529   Sepsis Labs: Recent Results (from the past 240 hour(s))  Culture, Urine     Status: None   Collection Time: 08/14/19 12:29 PM   Specimen: Urine, Catheterized  Result Value Ref Range Status   Specimen Description   Final    URINE, CATHETERIZED Performed at [facility], [place] [address] [place], [place] [address]    Special Requests   Final    Normal Performed at [facility], [place] [address] [place], [place] [address]    Culture   Final    NO GROWTH Performed at [facility], [place] [address] [place] [address], [place], [place] [address]    Report Status 08/15/2019 FINAL  Final      Radiological Exams on Admission: DG Chest Port 1 View  Result Date: 08/22/2019 CLINICAL DATA:  Unresponsive, tachycardia, hyperglycemia EXAM: PORTABLE CHEST 1 VIEW COMPARISON:  08/11/2019 FINDINGS: 2 frontal views  of the chest demonstrate a stable cardiac silhouette. Postsurgical changes from bypass surgery. There are persistent but improving bibasilar airspace opacities, left greater than right. No effusion or pneumothorax. No acute bony abnormalities. IMPRESSION: 1. Persistent but improving bibasilar airspace disease, consistent with multifocal pneumonia. Electronically Signed   By: Randa Ngo M.D.   On: 08/22/2019 03:57    EKG: Independently reviewed.  Sinus tachycardia 136 bpm   Assessment/Plan Metabolic encephalopathy secondary to hypoglycemia due to insulin: Acute.  On admission patient blood sugars noted to drop down to 16 on admission.  Patient had just recently been admitted into the hospital at the end of last month with hypoglycemia.  At discharge his 70/30 insulin was decreased from 38/36 units to 12 units twice daily. Medications on discharge paper were confusing and family continued previous regimen.   Last hemoglobin A1c noted to be 7.5 on 08/05/2019.  Patient had received amp of D50 and was placed on D5 drip.  -Admit to progressive bed -Hypoglycemic protocols -Continue D5 drip at 75 mL/h -CBGs every 4 hours for now  Acute respiratory failure with hypoxia: On admission patient's O2 saturation noted to be around 70% on room air for which she was placed in nonrebreather.  He was noted to be significantly tachycardic as well.  Concern for the possibility of PE given recent history of COVID-19.   -Follow-up CT angiogram of the chest -Continue heparin drip per pharmacy  Complicated urinary tract infection: Present on arrival.  Urinalysis positive for many bacteria similar to previous. Have not followed up with Urology yet. -Follow-up urine culture -Rocephin IV -Will need to  Change out Foley catheter at some point in time and establish follow-up appointment with urology  Pneumonia due to COVID-19: Patient just recently been admitted for pneumonia due to COVID-19 on 3/30.Had been having issues since March 18.  Completed course of remdesivir and Decadron.  He had initially been treated with Unasyn during his last hospitalization, but pro calcitonin was negative and it was stopped.  Question the possibility of bacterial component at this time. -Check procalcitonin(0.52) -Follow-up cultures -Incentive spirometry -Added on doxycycline for atypical coverage as QT appears prolonged   Leukocytosis: Acute.  WBC elevated at 14.9, but down from 20.8 on 4/14.  Question possibility of urinary tract infection as cause versus possible bacterial pneumonia. -Recheck CBC in a.m.  Elevated troponin:  Troponin elevated at 143 on admission, but appears to be lower than from previous admission on 3/31 where troponins elevated up to 227.  EKG without any acute ischemic changes.  Last echocardiogram revealed EF of 55 to 60% -Trend cardiac troponin -Currently on heparin drip  -Consider discontinuing if CT angiogram of the chest negative and cardiac enzymes appear to decrease  Acute metabolic encephalopathy, apraxia, history of CVA: Wife notes that patient is still not back to baseline.  Reports that he has been confused with inability to do certain things that he previously was able to like to use his cell phone.  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 -Neurochecks -Check MRI of the brain  Nausea and vomiting: Acute.  Patient still reports having several episodes of nausea and vomiting. -Trial Scopolamine patch due to prolonged QT interval  CAD s/p CABG: Patient with previous history of CABG in 2019. -Continue aspirin and statin  Prolonged QT interval: Acute.  Repeat EKG from 4:57AM significant for  QTC of 493. -Correct electrolyte abnormality -Limiting QT prolonging  medication  Chronic kidney disease stage IIIb: Creatinine 1.64 on admission which appears improved from previous admission last month. -Continue to monitor  Essential hypertension: On admission blood pressures appear to be stable.  During last admission he has been stopped on losartan due to AKI. Home medications include amlodipine 10 mg daily and metoprolol 50 mg nightly. -Continue amlodipine and metoprolol  Cataracts, diabetic retinopathy: Patient complains of having decreased vision.  Followed in outpatient setting by Dr. Tressia Danas at [facility].  Reports previously being given steroid injections into his eyes. -Will need to follow-up with his ophthalmologist in the outpatient setting  Elevated ALT: Acute.  ALT mildly elevated at 46.  Hyperlipidemia: Current regimen includes atorvastatin 40 mg daily. -Continue statin   BPH -Continue Flomax   History of prostate cancer: Status post orchiectomy.    DVT prophylaxis: Heparin Code Status: Full Family Communication: Wife updates Disposition Plan: Likely discharge home once medically stable Consults called: None Admission status: Inpatient  Norval Morton MD [facility] Pager [phone]   If 7PM-7AM, please contact night-coverage [email or web address] Password [facility]  08/22/2019, 7:50 AM

## 2019-08-22 NOTE — ED Triage Notes (Signed)
Per EMS, pt from home was found unresponsive, guppy breathing.  Initial heart rate was 150, found that his CBG was 68.  Pt was bagged and given D-10, became responsive and CBG was 228.  Pt has been in the hospital several times for COVID/pneumonia.  EMS report fever of 101.    In triage, O2 was 77% RA, took a non-rebreather at 15L to bring him to 93%  CBG in triage 101.

## 2019-08-22 NOTE — Progress Notes (Signed)
ANTICOAGULATION CONSULT NOTE - Initial Consult  Pharmacy Consult for heparin Indication: pulmonary embolus  Allergies  Allergen Reactions  . Iodine Hives    Patient Measurements: Height: 5\' 7"  (170.2 cm) Weight: 70.3 kg (155 lb) IBW/kg (Calculated) : 66.1 Heparin Dosing Weight: 70.3  Vital Signs: Temp: 99 F (37.2 C) (04/18 0242) Temp Source: Oral (04/18 0242) BP: 137/76 (04/18 0330) Pulse Rate: 99 (04/18 0330)  Labs: Recent Labs    08/22/19 0302  HGB 12.5*  HCT 39.8  PLT 207  APTT 29  LABPROT 16.4*  INR 1.3*  CREATININE 1.64*  TROPONINIHS 143*    Estimated Creatinine Clearance: 43.1 mL/min (A) (by C-G formula based on SCr of 1.64 mg/dL (H)).   Medical History: Past Medical History:  Diagnosis Date  . Asperger's syndrome   . Chronic kidney disease    CKD Stage 3 05/08/2017  . Coronary artery disease   . Diabetes mellitus without complication (HCC)    IDDM  . Diabetic retinopathy ([place])   . Diabetic retinopathy ([place])   . Diabetic retinopathy ([place]) 04/17/2017  . Hyperlipidemia    mixed  . Hypertension   . NSVT (nonsustained ventricular tachycardia) ([place])   . Pulmonary nodule 03/12/2017    Medications:  Scheduled:  . diphenhydrAMINE  50 mg Oral Once   Or  . diphenhydrAMINE  50 mg Intravenous Once  . heparin  4,000 Units Intravenous Once  . hydrocortisone sod succinate (SOLU-CORTEF) inj  200 mg Intravenous Once    Assessment: Patient presented with AMS after being found unresponsive at home secondary to hypoglycemia. He has a recent diagnosis of COVID and was noted to be hypoxic in ED, with his recent diagnosis of COVID, suspect possible PE. He has an allergy to contrast dye and will treat presumptively for now.   Goal of Therapy:  Heparin level 0.3-0.7 units/ml Monitor platelets by anticoagulation protocol: Yes   Plan:  Give 4000 units bolus x 1  Heparin 1200 units/hr Check 6 hour heparin level Daily heparin level and CBC  Marliss Czar  Mitchelle Sultan 08/22/2019,8:07 AM

## 2019-08-22 NOTE — Plan of Care (Signed)
Plan of care initiated.

## 2019-08-22 NOTE — ED Provider Notes (Signed)
[facility] EMERGENCY DEPARTMENT Provider Note   CSN: [id number] Arrival date & time: 08/22/19  3086     History Chief Complaint  Patient presents with  . Altered Mental Status  . Hypoglycemia    Lucas Hobbs is a 64 y.o. male.  HPI     This is a 64 year old male with a history of Asperger's, diabetes, coronary artery disease, hypertension, hyperlipidemia who presents with altered mental status. Per EMS, he was found unresponsive at home. Blood glucose was noted to be 68. He was given IV dextrose and his mental status improved. Patient reports that he has been eating well but does take insulin at home. Did not take any additional insulin tonight. However, he was noted to be hypoxic and was requiring oxygen. Per report, recent diagnosis of Covid and admission for the same. Patient reports that he was discharged home without oxygen. He states that he had been feeling well today prior to this episode. He does not remember passing out. He states he just remembers waking up to people over him. He has reported ongoing fevers.  Past Medical History:  Diagnosis Date  . Asperger's syndrome   . Chronic kidney disease    CKD Stage 3 05/08/2017  . Coronary artery disease   . Diabetes mellitus without complication (HCC)    IDDM  . Diabetic retinopathy ([place])   . Diabetic retinopathy ([place])   . Diabetic retinopathy ([place]) 04/17/2017  . Hyperlipidemia    mixed  . Hypertension   . NSVT (nonsustained ventricular tachycardia) ([place])   . Pulmonary nodule 03/12/2017    Patient Active Problem List   Diagnosis Date Noted  . Pressure injury of skin 08/14/2019  . Pneumonia due to COVID-19 virus   . Hypoglycemia 08/04/2019  . CKD (chronic kidney disease), stage IV ([place]) 08/04/2019  . Nausea & vomiting 08/04/2019  . Essential hypertension 08/04/2019  . DM (diabetes mellitus), type 2 with renal complications ([place]) 57/84/6962  . CAD (coronary artery disease) 08/04/2019  . COVID-19  08/04/2019  . Diabetic retinopathy [facility])     Past Surgical History:  Procedure Laterality Date  . CARDIAC CATHETERIZATION    . COLONOSCOPY W/ BIOPSIES  08/10/2016  . CORONARY ARTERY BYPASS GRAFT     CABG x2; Performed at [facility] 05/07/17  . RADICAL ORCHIECTOMY         Family History  Problem Relation Age of Onset  . CVA Father   . Heart attack Maternal Uncle   . Heart attack Paternal Uncle     Social History   Tobacco Use  . Smoking status: Never Smoker  . Smokeless tobacco: Never Used  Substance Use Topics  . Alcohol use: Not Currently  . Drug use: Never    Home Medications Prior to Admission medications   Medication Sig Start Date End Date Taking? Authorizing Provider  amLODipine (NORVASC) 10 MG tablet Take 10 mg by mouth daily. 08/06/17   [provider]  ascorbic acid (VITAMIN C) 500 MG tablet Take 1 tablet (500 mg total) by mouth daily. 08/17/19   Georgette Shell, MD  aspirin 81 MG chewable tablet Chew 81 mg by mouth daily. 02/15/13   [provider]  atorvastatin (LIPITOR) 40 MG tablet Take 40 mg by mouth every evening.  08/06/17   [provider]  insulin NPH-regular Human (70-30) 100 UNIT/ML injection Inject 12 Units into the skin 2 (two) times daily with a meal. 38 in the morning and 36 in the evening Patient taking differently:  Inject 36-38 Units into the skin 2 (two) times daily with a meal. 38 in the morning and 36 in the evening  08/17/19   Georgette Shell, MD  metoprolol succinate (TOPROL-XL) 100 MG 24 hr tablet Take 0.5 tablets (50 mg total) by mouth every evening. 08/17/19   Georgette Shell, MD  prochlorperazine (COMPAZINE) 10 MG tablet Take 1 tablet (10 mg total) by mouth 2 (two) times daily as needed for nausea or vomiting. 08/18/19   Carmin Muskrat, MD  tamsulosin (FLOMAX) 0.4 MG CAPS capsule Take 1 capsule (0.4 mg total) by mouth daily. 08/17/19   Georgette Shell, MD  zinc sulfate 220 (50 Zn) MG capsule Take 1 capsule  (220 mg total) by mouth daily. 08/17/19   Georgette Shell, MD    Allergies    Iodine  Review of Systems   Review of Systems  Constitutional: Positive for fever.  Respiratory: Positive for shortness of breath.   Cardiovascular: Negative for chest pain.  Gastrointestinal: Negative for abdominal pain, diarrhea, nausea and vomiting.  Genitourinary: Negative for dysuria.  Neurological:       Altered mental status  All other systems reviewed and are negative.   Physical Exam Updated Vital Signs BP 137/76   Pulse 99   Temp 99 F (37.2 C) (Oral)   Resp (!) 23   Ht 1.702 m (5\' 7" )   Wt 70.3 kg   SpO2 100%   BMI 24.28 kg/m   Physical Exam Vitals and nursing note reviewed.  Constitutional:      Appearance: He is well-developed.     Comments: Chronically ill-appearing but nontoxic  HENT:     Head: Normocephalic and atraumatic.     Mouth/Throat:     Mouth: Mucous membranes are dry.  Eyes:     Pupils: Pupils are equal, round, and reactive to light.  Cardiovascular:     Rate and Rhythm: Regular rhythm. Tachycardia present.     Heart sounds: Normal heart sounds. No murmur.  Pulmonary:     Effort: Pulmonary effort is normal. No respiratory distress.     Breath sounds: Normal breath sounds. No wheezing.     Comments: Nonrebreather in place, no significant respiratory distress, fair air movement with rales bilateral bases Abdominal:     General: Bowel sounds are normal.     Palpations: Abdomen is soft.     Tenderness: There is no abdominal tenderness. There is no rebound.  Musculoskeletal:     Cervical back: Neck supple.     Right lower leg: No edema.     Left lower leg: No edema.  Lymphadenopathy:     Cervical: No cervical adenopathy.  Skin:    General: Skin is warm and dry.  Neurological:     Mental Status: He is alert and oriented to person, place, and time.  Psychiatric:     Comments: Flat affect     ED Results / Procedures / Treatments   Labs (all labs  ordered are listed, but only abnormal results are displayed) Labs Reviewed  COMPREHENSIVE METABOLIC PANEL - Abnormal; Notable for the following components:      Result Value   Sodium 146 (*)    BUN 31 (*)    Creatinine, Ser 1.64 (*)    Calcium 8.3 (*)    Total Protein 5.6 (*)    Albumin 2.3 (*)    ALT 46 (*)    GFR calc non Af Amer 44 (*)    GFR calc Af Lucas Hobbs  51 (*)    All other components within normal limits  CBC WITH DIFFERENTIAL/PLATELET - Abnormal; Notable for the following components:   WBC 14.9 (*)    Hemoglobin 12.5 (*)    Neutro Abs 13.9 (*)    Lymphs Abs 0.3 (*)    All other components within normal limits  PROTIME-INR - Abnormal; Notable for the following components:   Prothrombin Time 16.4 (*)    INR 1.3 (*)    All other components within normal limits  URINALYSIS, ROUTINE W REFLEX MICROSCOPIC - Abnormal; Notable for the following components:   APPearance TURBID (*)    Glucose, UA 50 (*)    Hgb urine dipstick MODERATE (*)    Protein, ur 100 (*)    Bacteria, UA MANY (*)    All other components within normal limits  CBG MONITORING, ED - Abnormal; Notable for the following components:   Glucose-Capillary 101 (*)    All other components within normal limits  CBG MONITORING, ED - Abnormal; Notable for the following components:   Glucose-Capillary 16 (*)    All other components within normal limits  CBG MONITORING, ED - Abnormal; Notable for the following components:   Glucose-Capillary 143 (*)    All other components within normal limits  TROPONIN I (HIGH SENSITIVITY) - Abnormal; Notable for the following components:   Troponin I (High Sensitivity) 143 (*)    All other components within normal limits  CULTURE, BLOOD (ROUTINE X 2)  CULTURE, BLOOD (ROUTINE X 2)  URINE CULTURE  LACTIC ACID, PLASMA  APTT  LACTIC ACID, PLASMA  C-PEPTIDE  BLOOD GAS, ARTERIAL  CBG MONITORING, ED  TROPONIN I (HIGH SENSITIVITY)    EKG EKG Interpretation  Date/Time:  Sunday August 22 2019 04:57:43 EDT Ventricular Rate:  89 PR Interval:    QRS Duration: 98 QT Interval:  405 QTC Calculation: 493 R Axis:   66 Text Interpretation: Sinus rhythm Ventricular premature complex Probable left atrial enlargement Inferior infarct, age indeterminate Probable anterior infarct, age indeterminate SLower when compared to prior Confirmed by Thayer Jew ([id number]) on 08/22/2019 4:59:57 AM   Radiology DG Chest Port 1 View  Result Date: 08/22/2019 CLINICAL DATA:  Unresponsive, tachycardia, hyperglycemia EXAM: PORTABLE CHEST 1 VIEW COMPARISON:  08/11/2019 FINDINGS: 2 frontal views of the chest demonstrate a stable cardiac silhouette. Postsurgical changes from bypass surgery. There are persistent but improving bibasilar airspace opacities, left greater than right. No effusion or pneumothorax. No acute bony abnormalities. IMPRESSION: 1. Persistent but improving bibasilar airspace disease, consistent with multifocal pneumonia. Electronically Signed   By: Randa Ngo M.D.   On: 08/22/2019 03:57    Procedures Procedures (including critical care time)  CRITICAL CARE Performed by: Merryl Hacker   Total critical care time: 45 minutes  Critical care time was exclusive of separately billable procedures and treating other patients.  Critical care was necessary to treat or prevent imminent or life-threatening deterioration.  Critical care was time spent personally by me on the following activities: development of treatment plan with patient and/or surrogate as well as nursing, discussions with consultants, evaluation of patient's response to treatment, examination of patient, obtaining history from patient or surrogate, ordering and performing treatments and interventions, ordering and review of laboratory studies, ordering and review of radiographic studies, pulse oximetry and re-evaluation of patient's condition.   Medications Ordered in ED Medications  dextrose 5 %-0.45 % sodium  chloride infusion ( Intravenous New Bag/Given 08/22/19 0605)  hydrocortisone sodium succinate (SOLU-CORTEF) injection 200 mg (has  no administration in time range)  diphenhydrAMINE (BENADRYL) capsule 50 mg (has no administration in time range)    Or  diphenhydrAMINE (BENADRYL) injection 50 mg (has no administration in time range)  dextrose 50 % solution 50 mL (50 mLs Intravenous Given 08/22/19 0509)    ED Course  I have reviewed the triage vital signs and the nursing notes.  Pertinent labs & imaging results that were available during my care of the patient were reviewed by me and considered in my medical decision making (see chart for details).  Clinical Course as of Aug 21 740  Sun Aug 22, 2019  3267 Spoke to the patient's wife.  She reports that he has had difficulty with episodes of hypoglycemia since being discharged from the hospital.  He was given 1 dose of 50 units of 70/30 yesterday afternoon by home health.  Wife is currently in [place] as he is here taking care of his mother.  She reports that he has had general decline with decreased oral intake as well.  He did not get discharged with home oxygen.   [CH]    Clinical Course User Index [CH] Doron Shake, Barbette Hair, MD   MDM Rules/Calculators/A&P                       Patient presents initially with an episode of hypoglycemia.  He was given D50 by EMS.  He subsequently was noted to have hypoxia and is on a nonrebreather.  Recent admission for Covid.  Regarding his hypoglycemia, wife states that this has been worsening since discharge.  He took her last dose yesterday but has not been eating well.  He subsequently had a glucose recheck at 16.  He was given another amp of D50 and started on D5 half-normal saline.  I also requested that he be given something to eat.  Question insulin compliance.  Will order a C-peptide.  Additionally, regarding his hypoxia, he was weaned to 3 L.  Given his recent Covid diagnosis, chest x-ray was obtained and  does not show any worsening but actually some improvement.  Would question possibility of PE given Covid.  Unfortunately he has an allergy to contrast dye.  Discussed with the admitting hospitalist.  Will treat presumptively with IV heparin and premedicate for a CTA.  In addition he will be admitted for ongoing episodes of hypoglycemia.  Lucas Hobbs was evaluated in Emergency Department on 08/22/2019 for the symptoms described in the history of present illness. He was evaluated in the context of the global COVID-19 pandemic, which necessitated consideration that the patient might be at risk for infection with the SARS-CoV-2 virus that causes COVID-19. Institutional protocols and algorithms that pertain to the evaluation of patients at risk for COVID-19 are in a state of rapid change based on information released by regulatory bodies including the CDC and federal and state organizations. These policies and algorithms were followed during the patient's care in the ED.   Final Clinical Impression(s) / ED Diagnoses Final diagnoses:  Hypoglycemia  Acute respiratory failure with hypoxia ([place])  History of COVID-19    Rx / DC Orders ED Discharge Orders    None       Wynona Duhamel, Barbette Hair, MD 08/22/19 [phone]

## 2019-08-22 NOTE — ED Notes (Signed)
Lucas Hobbs, wife, [phone] would like an update when available

## 2019-08-22 NOTE — Progress Notes (Signed)
[place] for heparin Indication: pulmonary embolus  Allergies  Allergen Reactions  . Iodine Hives    Patient Measurements: Height: 5\' 7"  (170.2 cm) Weight: 70.3 kg (155 lb) IBW/kg (Calculated) : 66.1 Heparin Dosing Weight: 70.3  Vital Signs: Temp: 99.1 F (37.3 C) (04/18 1236) Temp Source: Axillary (04/18 1236) BP: 146/70 (04/18 1236) Pulse Rate: 74 (04/18 1100)  Labs: Recent Labs    08/22/19 0302 08/22/19 0706 08/22/19 1424  HGB 12.5*  --   --   HCT 39.8  --   --   PLT 207  --   --   APTT 29  --   --   LABPROT 16.4*  --   --   INR 1.3*  --   --   HEPARINUNFRC  --   --  0.86*  CREATININE 1.64*  --   --   TROPONINIHS 143* 172*  --     Estimated Creatinine Clearance: 43.1 mL/min (A) (by C-G formula based on SCr of 1.64 mg/dL (H)).   Medical History: Past Medical History:  Diagnosis Date  . Asperger's syndrome   . Chronic kidney disease    CKD Stage 3 05/08/2017  . Coronary artery disease   . Diabetes mellitus without complication (HCC)    IDDM  . Diabetic retinopathy ([place])   . Diabetic retinopathy ([place])   . Diabetic retinopathy ([place]) 04/17/2017  . Hyperlipidemia    mixed  . Hypertension   . NSVT (nonsustained ventricular tachycardia) ([place])   . Pulmonary nodule 03/12/2017  . Testicular cancer (HCC)     Medications:  Scheduled:  . amLODipine  10 mg Oral Daily  . aspirin  81 mg Oral Daily  . atorvastatin  40 mg Oral QPM  . guaiFENesin  600 mg Oral BID  . metoprolol succinate  50 mg Oral QPM  . scopolamine  1 patch Transdermal Q72H  . sodium chloride flush  3 mL Intravenous Q12H  . tamsulosin  0.4 mg Oral Daily    Assessment: Patient presented with AMS after being found unresponsive at home secondary to hypoglycemia. He has a recent diagnosis of COVID and was noted to be hypoxic in ED, with his recent diagnosis of COVID, suspect possible PE. He has an allergy to contrast dye and will treat presumptively for  now.   Initial heparin level above goal at 0.86, no bleeding or IV issues per nursing.   Goal of Therapy:  Heparin level 0.3-0.7 units/ml Monitor platelets by anticoagulation protocol: Yes   Plan Reduce heparin to 1050 units/hr Recheck heparin level early am  Erin Hearing PharmD., BCPS Clinical Pharmacist 08/22/2019 5:12 PM

## 2019-08-23 ENCOUNTER — Inpatient Hospital Stay (HOSPITAL_COMMUNITY): Payer: 59

## 2019-08-23 DIAGNOSIS — E162 Hypoglycemia, unspecified: Secondary | ICD-10-CM

## 2019-08-23 LAB — BASIC METABOLIC PANEL
Anion gap: 10 (ref 5–15)
BUN: 33 mg/dL — ABNORMAL HIGH (ref 8–23)
CO2: 25 mmol/L (ref 22–32)
Calcium: 7.5 mg/dL — ABNORMAL LOW (ref 8.9–10.3)
Chloride: 105 mmol/L (ref 98–111)
Creatinine, Ser: 1.83 mg/dL — ABNORMAL HIGH (ref 0.61–1.24)
GFR calc Af Amer: 45 mL/min — ABNORMAL LOW (ref >60)
GFR calc non Af Amer: 38 mL/min — ABNORMAL LOW (ref >60)
Glucose, Bld: 226 mg/dL — ABNORMAL HIGH (ref 70–99)
Potassium: 3.6 mmol/L (ref 3.5–5.1)
Sodium: 140 mmol/L (ref 135–145)

## 2019-08-23 LAB — C-PEPTIDE: C-Peptide: 0.2 ng/mL — ABNORMAL LOW (ref 1.1–4.4)

## 2019-08-23 LAB — URINE CULTURE: Culture: NO GROWTH

## 2019-08-23 LAB — CBC
HCT: 28.8 % — ABNORMAL LOW (ref 39.0–52.0)
Hemoglobin: 9.4 g/dL — ABNORMAL LOW (ref 13.0–17.0)
MCH: 28.9 pg (ref 26.0–34.0)
MCHC: 32.6 g/dL (ref 30.0–36.0)
MCV: 88.6 fL (ref 80.0–100.0)
Platelets: 163 10*3/uL (ref 150–400)
RBC: 3.25 MIL/uL — ABNORMAL LOW (ref 4.22–5.81)
RDW: 13.8 % (ref 11.5–15.5)
WBC: 10.1 10*3/uL (ref 4.0–10.5)
nRBC: 0 % (ref 0.0–0.2)

## 2019-08-23 LAB — GLUCOSE, CAPILLARY
Glucose-Capillary: 159 mg/dL — ABNORMAL HIGH (ref 70–99)
Glucose-Capillary: 214 mg/dL — ABNORMAL HIGH (ref 70–99)
Glucose-Capillary: 228 mg/dL — ABNORMAL HIGH (ref 70–99)

## 2019-08-23 LAB — BRAIN NATRIURETIC PEPTIDE: B Natriuretic Peptide: 178.9 pg/mL — ABNORMAL HIGH (ref 0.0–100.0)

## 2019-08-23 LAB — PROCALCITONIN: Procalcitonin: 0.39 ng/mL

## 2019-08-23 LAB — HEPARIN LEVEL (UNFRACTIONATED): Heparin Unfractionated: 0.66 IU/mL (ref 0.30–0.70)

## 2019-08-23 LAB — C-REACTIVE PROTEIN: CRP: 10.1 mg/dL — ABNORMAL HIGH (ref <1.0)

## 2019-08-23 MED ORDER — HEPARIN SODIUM (PORCINE) 5000 UNIT/ML IJ SOLN
5000.0000 [IU] | Freq: Three times a day (TID) | INTRAMUSCULAR | Status: DC
  Administered 2019-08-23 – 2019-08-26 (×8): 5000 [IU] via SUBCUTANEOUS
  Filled 2019-08-23 (×8): qty 1

## 2019-08-23 MED ORDER — SODIUM CHLORIDE 0.9 % IV SOLN
2.0000 g | Freq: Two times a day (BID) | INTRAVENOUS | Status: DC
  Administered 2019-08-23 – 2019-08-26 (×7): 2 g via INTRAVENOUS
  Filled 2019-08-23 (×8): qty 2

## 2019-08-23 MED ORDER — CHLORHEXIDINE GLUCONATE CLOTH 2 % EX PADS
6.0000 | MEDICATED_PAD | Freq: Every day | CUTANEOUS | Status: DC
  Administered 2019-08-24 – 2019-08-26 (×3): 6 via TOPICAL

## 2019-08-23 MED ORDER — DOXYCYCLINE HYCLATE 100 MG PO TABS
100.0000 mg | ORAL_TABLET | Freq: Two times a day (BID) | ORAL | Status: AC
  Administered 2019-08-23 – 2019-08-26 (×6): 100 mg via ORAL
  Filled 2019-08-23 (×6): qty 1

## 2019-08-23 MED ORDER — INSULIN ASPART 100 UNIT/ML ~~LOC~~ SOLN
0.0000 [IU] | Freq: Three times a day (TID) | SUBCUTANEOUS | Status: DC
  Administered 2019-08-23 – 2019-08-24 (×3): 5 [IU] via SUBCUTANEOUS
  Administered 2019-08-24: 8 [IU] via SUBCUTANEOUS
  Administered 2019-08-25: 3 [IU] via SUBCUTANEOUS
  Administered 2019-08-25: 5 [IU] via SUBCUTANEOUS
  Administered 2019-08-25: 3 [IU] via SUBCUTANEOUS
  Administered 2019-08-26: 08:00:00 2 [IU] via SUBCUTANEOUS

## 2019-08-23 MED ORDER — PHENOL 1.4 % MT LIQD: 1.0000 | OROMUCOSAL | Status: DC | PRN

## 2019-08-23 MED ORDER — PREDNISONE 20 MG PO TABS
40.0000 mg | ORAL_TABLET | Freq: Every day | ORAL | Status: DC
  Administered 2019-08-23 – 2019-08-24 (×2): 40 mg via ORAL
  Filled 2019-08-23 (×2): qty 2

## 2019-08-23 MED ORDER — HEPARIN SODIUM (PORCINE) 5000 UNIT/ML IJ SOLN: 5000.0000 [IU] | Freq: Three times a day (TID) | INTRAMUSCULAR | Status: DC

## 2019-08-23 NOTE — Progress Notes (Signed)
PROGRESS NOTE                                                                                                                                                                                                             Patient Demographics:    Lucas Hobbs, is a 64 y.o. male, DOB - 25-May-1955, KCL:[id number] [email or web address] Outpatient Primary MD for the patient is Nelwyn Salisbury, Hershal Coria   Admit date - 08/22/2019   LOS - 1  Chief Complaint  Patient presents with  . Altered Mental Status  . Hypoglycemia       Brief Narrative: Patient is a 64 y.o. male with PMHx of insulin-dependent DM-2, CKD stage IV, CVA, CAD s/p CABG, HTN who was hospitalized from 3/30-4/13 due to hypoxia from Covid 19 pneumonia, he returned to the emergency room due to severe hypoglycemia, acute metabolic encephalopathy and was found to be hypoxemic likely secondary to HCAP.  See below for further details.  Significant Events: 4/18>> admit to [facility] for hypoglycemia, metabolic encephalopathy and hypoxemia 4/18>> MRI brain: No acute infarct 3/30-4/13 admit for hypoxia from Covid 19 pneumonia.  COVID-19 medications: Steroids: 3/31>> 4/9 Remdesivir: 3/31>> 4/4  Antibiotics: Cefepime: 4/19>> Doxycycline: 4/18>> Ceftriaxone: 4/18 x 1  Microbiology data: 4/18: Blood culture>> no growth 4/18: Urine culture>> pending  DVT prophylaxis: IV heparin>> SQ Lovenox  Procedures: None  Consults: None    Subjective:    Cleland Simkins today is much more awake and alert-stable on 2 L of oxygen this morning.  He appears to be a poor historian-he was not aware that his insulin regimen was decreased during his last hospitalization/discharge.   Assessment  & Plan :   Hypoglycemia: Secondary to excessive insulin administration-his dosage of insulin was  decreased during his most recent hospitalization.  Start SSI-see how he does-if no further episodes of hypoglycemia-plan to resume long-acting insulin.  Acute metabolic encephalopathy: Suspect secondary to above-he seems to be a poor historian but is otherwise able to answer most questions appropriately this morning.  MRI brain on 4/19-no acute intracranial abnormality-chronic multifocal infarcts in the right MCA.  Per patient spouse-patient has had memory issues for the past several years-suspect he may have some amount of undiagnosed dementia at baseline.  Acute hypoxemic respiratory failure: Multifactorial-suspect he has some residual Covid pneumonitis-probable bacterial coinfection as well as procalcitonin mildly elevated.  Continue cefepime/doxycycline-since CRP elevated-we will plan on a short course of steroids.  Since no pulmonary embolism on CTA chest-okay to stop IV heparin.  Complicated UTI: Await cultures-he is a very vague/poor historian-unable to discern if he has symptoms-but has a Foley catheter in place since his last discharge-continue cefepime-await cultures.  Minimally elevated troponin: Suspect from underlying CKD/demand ischemia from acute illness-trend is flat-not consistent with ACS.-He does have a history of CAD and is s/p CABG.  History of acute urinary retention during his most recent hospitalization Foley catheter in place: Continue Flomax-during his most recent hospitalization he failed a voiding trial and Foley catheter was reinserted just prior to his discharge.  Will need to attempt voiding trial over the next few days.  HTN: BP controlled-continue amlodipine and metoprolol  CAD s/p CABG: No anginal symptoms-continue aspirin, statin, metoprolol  History of CVA seen on imaging studies: Nonfocal exam-MRI with no acute CVA-continue aspirin/statin.  Per patient spouse-she is not aware of a prior diagnosis of CVA for which the patient was admitted to the hospital with.   Could have been asymptomatic.  QTC prolongation: Keep K> 4,Mg > 2-we will repeat both today-recheck electrolytes tomorrow.  Avoid QTC prolonging agents  Nausea/vomiting: Continue scopolamine for now-once QTC normalizes-could have underlying gastroparesis-once her QTC improves-we could consider a trial of Reglan.  DM-2 (A1c 7.5 on 08/05/2019): Did have episodes of hypoglycemia during his last hospitalization as well-see above.  CKD stage IV: Creatinine close to baseline-monitor periodically-avoid nephrotoxic agents.  ?Dementia/cognitive dysfunction: Spouse reports several years of memory issues/confusion-probably has some amount of lingering encephalopathy from recent Covid infection hypoxemia/hypoglycemia.  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  Have asked spouse to make appointment with neurology in the outpatient setting when he has recovered from his acute illness.  Check TSH, RPR, vitamin B12  ABG: No results found for: PHART, PCO2ART, PO2ART, HCO3, TCO2, ACIDBASEDEF, O2SAT  Vent Settings: N/A    Condition - Stable  Family Communication  :  Spouse updated over the phone 4/19  Code Status :  Full Code  Diet :  Diet Order            Diet heart healthy/carb modified Room service appropriate? Yes; Fluid consistency: Thin  Diet effective now               Disposition Plan  :  Remain hospitalized-may require SNF  Barriers to discharge: Hypoxia requiring O2 supplementation/on IV antibiotics  Antimicorbials  :    Anti-infectives (From admission, onward)   Start     Dose/Rate Route Frequency Ordered Stop   08/23/19 0800  ceFEPIme (MAXIPIME) 2 g in sodium chloride 0.9 % 100 mL IVPB     2 g 200 mL/hr over 30 Minutes Intravenous Every 12 hours 08/23/19 0757     08/22/19 1145  doxycycline (VIBRAMYCIN) 100 mg in sodium chloride 0.9 % 250 mL  IVPB     100 mg 125 mL/hr over 120 Minutes Intravenous Every 12 hours 08/22/19 1144     08/22/19 1000  cefTRIAXone (ROCEPHIN) 2 g in sodium chloride 0.9 % 100 mL IVPB  Status:  Discontinued     2 g 200 mL/hr over 30 Minutes Intravenous Every 24 hours 08/22/19 0811 08/23/19 0724      Inpatient Medications  Scheduled Meds: . amLODipine  10 mg Oral Daily  . aspirin  81 mg Oral Daily  .  atorvastatin  40 mg Oral QPM  . guaiFENesin  600 mg Oral BID  . metoprolol succinate  50 mg Oral QPM  . scopolamine  1 patch Transdermal Q72H  . sodium chloride flush  3 mL Intravenous Q12H  . tamsulosin  0.4 mg Oral Daily   Continuous Infusions: . ceFEPime (MAXIPIME) IV 2 g (08/23/19 1015)  . doxycycline (VIBRAMYCIN) IV 100 mg (08/22/19 2346)  . heparin 1,050 Units/hr (08/23/19 0622)   PRN Meds:.acetaminophen **OR** acetaminophen, albuterol, phenol   Time Spent in minutes  35    See all Orders from today for further details   Oren Binet M.D on 08/23/2019 at 11:57 AM  To page go to [email or web address] - use universal password  [facility] -  Office  [phone]    Objective:   Vitals:   08/22/19 1236 08/22/19 1950 08/22/19 2326 08/23/19 0757  BP: (!) 146/70 124/60 134/63 135/63  Pulse:  83 73 64  Resp:  20 19 17   Temp: 99.1 F (37.3 C) 98.5 F (36.9 C) 98.5 F (36.9 C) 98.3 F (36.8 C)  TempSrc: Axillary Oral Oral Oral  SpO2:  93% 93% 92%  Weight:      Height:        Wt Readings from Last 3 Encounters:  08/22/19 70.3 kg  08/18/19 70.3 kg  08/17/19 70.3 kg     Intake/Output Summary (Last 24 hours) at 08/23/2019 1157 Last data filed at 08/23/2019 0300 Gross per 24 hour  Intake 1953.78 ml  Output 750 ml  Net 1203.78 ml     Physical Exam Gen Exam: Slightly confused at times but easily redirectable-answers most of my questions appropriately. HEENT:atraumatic, normocephalic Chest: B/L clear to auscultation anteriorly CVS:S1S2 regular Abdomen:soft non tender,  non distended Extremities:no edema Neurology: Difficult exam but nonfocal Skin: no rash   Data Review:    CBC Recent Labs  Lab 08/18/19 1219 08/22/19 0302 08/23/19 0235  WBC 20.8* 14.9* 10.1  HGB 13.2 12.5* 9.4*  HCT 40.3 39.8 28.8*  PLT 157 207 163  MCV 87.6 89.6 88.6  MCH 28.7 28.2 28.9  MCHC 32.8 31.4 32.6  RDW 13.4 13.9 13.8  LYMPHSABS 0.4* 0.3*  --   MONOABS 1.1* 0.7  --   EOSABS 0.0 0.0  --   BASOSABS 0.0 0.0  --     Chemistries  Recent Labs  Lab 08/18/19 1219 08/22/19 0302 08/23/19 0235  NA 143 146* 140  K 4.1 3.9 3.6  CL 105 109 105  CO2 23 27 25   GLUCOSE 230* 84 226*  BUN 35* 31* 33*  CREATININE 1.91* 1.64* 1.83*  CALCIUM 8.7* 8.3* 7.5*  AST 54* 39  --   ALT 61* 46*  --   ALKPHOS 57 58  --   BILITOT 1.2 1.1  --    ------------------------------------------------------------------------------------------------------------------ No results for input(s): CHOL, HDL, LDLCALC, TRIG, CHOLHDL, LDLDIRECT in the last 72 hours.  Lab Results  Component Value Date   HGBA1C 7.5 (H) 08/05/2019   ------------------------------------------------------------------------------------------------------------------ No results for input(s): TSH, T4TOTAL, T3FREE, THYROIDAB in the last 72 hours.  Invalid input(s): FREET3 ------------------------------------------------------------------------------------------------------------------ No results for input(s): VITAMINB12, FOLATE, FERRITIN, TIBC, IRON, RETICCTPCT in the last 72 hours.  Coagulation profile Recent Labs  Lab 08/22/19 0302  INR 1.3*    No results for input(s): DDIMER in the last 72 hours.  Cardiac Enzymes No results for input(s): CKMB, TROPONINI, MYOGLOBIN in the last 168 hours.  Invalid input(s): CK ------------------------------------------------------------------------------------------------------------------    Component Value Date/Time  BNP 178.9 (H) 08/23/2019 0726    Micro  Results Recent Results (from the past 240 hour(s))  Culture, Urine     Status: None   Collection Time: 08/14/19 12:29 PM   Specimen: Urine, Catheterized  Result Value Ref Range Status   Specimen Description   Final    URINE, CATHETERIZED Performed at [facility] [address], [place], [place] [address]    Special Requests   Final    Normal Performed at [facility], [place] [address], [place], [place] [address]    Culture   Final    NO GROWTH Performed at [facility], [place] [address], [place], [place] [address]    Report Status 08/15/2019 FINAL  Final  Blood Culture (routine x 2)     Status: None (Preliminary result)   Collection Time: 08/22/19  3:46 AM   Specimen: BLOOD RIGHT ARM  Result Value Ref Range Status   Specimen Description BLOOD RIGHT ARM  Final   Special Requests   Final    BOTTLES DRAWN AEROBIC AND ANAEROBIC Blood Culture adequate volume   Culture   Final    NO GROWTH 1 DAY Performed at [facility], [place] [address], [place], [place] [address]    Report Status PENDING  Incomplete  Urine culture     Status: None   Collection Time: 08/22/19  5:29 AM   Specimen: In/Out Cath Urine  Result Value Ref Range Status   Specimen Description IN/OUT CATH URINE  Final   Special Requests NONE  Final   Culture   Final    NO GROWTH Performed at [facility], [place] [address], [place], [place] [address]    Report Status 08/23/2019 FINAL  Final  Blood Culture (routine x 2)     Status: None (Preliminary result)   Collection Time: 08/22/19  7:10 AM   Specimen: BLOOD LEFT WRIST  Result Value Ref Range Status   Specimen Description BLOOD LEFT WRIST  Final   Special Requests   Final    BOTTLES DRAWN AEROBIC AND ANAEROBIC Blood Culture results may not be optimal due to an inadequate volume of blood received in culture bottles   Culture   Final    NO GROWTH 1 DAY Performed at [facility], [place] [address],  [place], [place] [address]    Report Status PENDING  Incomplete    Radiology Reports DG Chest 1 View  Result Date: 08/11/2019 CLINICAL DATA:  COVID-19 positive, hypoxemia. EXAM: CHEST  1 VIEW COMPARISON:  August 07, 2019. FINDINGS: Stable cardiomediastinal silhouette. Sternotomy wires are noted. No pneumothorax or pleural effusion is noted. Bilateral lung opacities are decreased compared to prior exam suggesting improving multifocal pneumonia. Bony thorax is unremarkable. IMPRESSION: Improving bilateral lung opacities are noted suggesting improving multifocal pneumonia. Electronically Signed   By: Marijo Conception M.D.   On: 08/11/2019 15:57   DG Chest 2 View  Result Date: 08/03/2019 CLINICAL DATA:  Vomiting, short of breath, hiccups EXAM: CHEST - 2 VIEW COMPARISON:  None. FINDINGS: Frontal and lateral views of the chest demonstrate an unremarkable cardiac silhouette. Postsurgical changes are seen from bypass surgery. No airspace disease, effusion, or pneumothorax. No acute bony abnormalities. IMPRESSION: 1. No acute intrathoracic process. Electronically Signed   By: Randa Ngo M.D.   On: 08/03/2019 23:22   DG Abd 1 View  Result Date: 08/14/2019 CLINICAL DATA:  Nausea vomiting EXAM: ABDOMEN - 1 VIEW COMPARISON:  Abdominal  film 08/09/2019 FINDINGS: Post median sternotomy. Lung bases with scattered airspace disease similar to prior chest x-ray for abdominal series. No signs of bowel obstruction. Stool and gas throughout the colon. Stool and rectal gas. No acute bone finding. No signs of abnormal calcification. IMPRESSION: Scattered basilar airspace disease better assessed on previous dedicated chest radiography. No signs of bowel obstruction. Electronically Signed   By: Zetta Bills M.D.   On: 08/14/2019 15:34   CT HEAD WO CONTRAST  Result Date: 08/06/2019 CLINICAL DATA:  Delirium EXAM: CT HEAD WITHOUT CONTRAST TECHNIQUE: Contiguous axial images were obtained from the base of the skull through the  vertex without intravenous contrast. COMPARISON:  None. FINDINGS: Brain: No acute hemorrhage, acute infarction or space-occupying mass lesion is seen. Multifocal infarcts are noted on the right with encephalomalacia predominately within the distribution of the right middle cerebral artery. Vascular: No hyperdense vessel or unexpected calcification. Skull: Normal. Negative for fracture or focal lesion. Sinuses/Orbits: Mucosal retention cyst is noted in the left maxillary antrum. 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 No acute abnormality noted. Electronically Signed   By: Inez Catalina M.D.   On: 08/06/2019 20:16   CT Angio Chest PE W and/or Wo Contrast  Result Date: 08/22/2019 CLINICAL DATA:  Hypoxemia EXAM: CT ANGIOGRAPHY CHEST WITH CONTRAST TECHNIQUE: Multidetector CT imaging of the chest was performed using the standard protocol during bolus administration of intravenous contrast. Multiplanar CT image reconstructions and MIPs were obtained to evaluate the vascular anatomy. CONTRAST:  16mL OMNIPAQUE IOHEXOL 350 MG/ML SOLN COMPARISON:  None. FINDINGS: Cardiovascular: There is no pulmonary embolism identified within the main, lobar or segmental pulmonary arteries bilaterally. Aortic atherosclerosis. No thoracic aortic aneurysm. No pericardial effusion. Three-vessel coronary artery atherosclerosis. Median sternotomy for presumed CABG. Mediastinum/Nodes: No mass or enlarged lymph nodes within the mediastinum or perihilar regions. Esophagus appears normal. Trachea appears normal. Lungs/Pleura: Patchy nodular consolidations and ground-glass opacities throughout both lungs, most prominent at the lung bases, consistent with multifocal pneumonia. No pleural effusion or pneumothorax. Upper Abdomen: Limited images of the upper abdomen are unremarkable. Musculoskeletal: Median sternotomy wires in place. No acute or suspicious osseous finding.  Review of the MIP images confirms the above findings. IMPRESSION: 1. Patchy nodular consolidations and ground-glass opacities throughout both lungs, most prominent at the lung bases, most likely multifocal pneumonia. COVID 19 pneumonia can have this appearance. Differential includes atypical pneumonias such as viral or fungal, interstitial pneumonias, edema related to volume overload/CHF, chronic interstitial diseases, hypersensitivity pneumonitis, and respiratory bronchiolitis. 2. No pulmonary embolism seen. Aortic Atherosclerosis (ICD10-I70.0). Electronically Signed   By: Franki Cabot M.D.   On: 08/22/2019 14:24   MR BRAIN WO CONTRAST  Result Date: 08/23/2019 CLINICAL DATA:  64 year old male with subacute neurologic deficits. Delirium earlier this month. EXAM: MRI HEAD WITHOUT CONTRAST TECHNIQUE: Multiplanar, multiecho pulse sequences of the brain and surrounding structures were obtained without intravenous contrast. COMPARISON:  Head CT 08/06/2019. FINDINGS: Brain: Multifocal right hemisphere developing encephalomalacia with areas of laminar necrosis and hemosiderin. These are within the right MCA territory and correspond to the abnormal hypodensity on CT earlier this month. None of these areas is definitely restricted on diffusion. No restricted diffusion to suggest acute infarction. No midline shift, mass effect, evidence of mass lesion, ventriculomegaly, extra-axial collection or acute intracranial hemorrhage. Cervicomedullary junction and pituitary are within normal limits. Outside of the right MCA territory there is patchy bilateral cerebral white matter T2 and FLAIR hyperintensity, and  occasional chronic micro hemorrhages (posterior left centrum semiovale series 14, image 40). But no other cortical encephalomalacia. However, there is evidence of chronic lacunar infarcts in the right basal ganglia and thalamus. Brainstem and cerebellum appear negative. Vascular: Major intracranial vascular flow voids  are preserved, the left vertebral artery appears dominant. Skull and upper cervical spine: Negative visible cervical spine and bone marrow signal. Sinuses/Orbits: Negative orbits. Trace paranasal sinus mucosal thickening. Other: Trace bilateral mastoid air cell fluid, probably postinflammatory with negative visible nasopharynx. Other visible internal auditory structures appear normal. Negative scalp and face soft tissues. IMPRESSION: 1. No acute intracranial abnormality. 2. 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 Associated laminar necrosis, hemosiderin, and developing encephalomalacia. 3. Superimposed chronic small vessel disease in the right deep gray nuclei and bilateral white matter with occasional other chronic microhemorrhages. Electronically Signed   By: Genevie Ann M.D.   On: 08/23/2019 05:23   US Abdomen Complete  Result Date: 08/04/2019 CLINICAL DATA:  Nausea and vomiting for 7 days EXAM: ABDOMEN ULTRASOUND COMPLETE COMPARISON:  CT from earlier today FINDINGS: Gallbladder: Cholelithiasis. No wall thickening or focal tenderness. Common bile duct: Diameter: 4 mm Liver: No focal lesion identified. Within normal limits in parenchymal echogenicity. Portal vein is patent on color Doppler imaging with normal direction of blood flow towards the liver. IVC: No abnormality visualized. Pancreas: Obscured. Spleen: Size and appearance within normal limits. Right Kidney: Length: 12 cm. Right lower pole cyst measuring 19 mm, correlating with hemorrhagic cyst on prior CT. A thin benign-appearing septation is present. Left Kidney: Length: 11.6 cm. Echogenicity within normal limits. No mass or hydronephrosis visualized. Abdominal aorta: No aneurysm visualized.  Atherosclerosis IMPRESSION: 1. No additional finding when compared to CT earlier the same day. 2. Cholelithiasis. Electronically Signed   By: Monte Fantasia M.D.   On: 08/04/2019 06:38   US  RENAL  Result Date: 08/10/2019 CLINICAL DATA:  Acute renal failure EXAM: RENAL / URINARY TRACT ULTRASOUND COMPLETE COMPARISON:  Ultrasound abdomen 08/04/2019 FINDINGS: Right Kidney: Renal measurements: 10.2 x 5.2 x 6.8 cm = volume: 190 mL. Negative for renal obstruction. Several small renal cysts are present largest measuring 1.5 cm. Normal cortical echogenicity. Left Kidney: Renal measurements: 9.8 x 4.9 x 4.8 cm = volume: 121 mL. Echogenicity within normal limits. No mass or hydronephrosis visualized. Bladder: Normal bladder. The bladder is full with a Foley catheter inflated in the bladder. Other: None. IMPRESSION: No renal obstruction.  Small right renal cyst noted. Bladder is full with a Foley catheter in place. Electronically Signed   By: Franchot Gallo M.D.   On: 08/10/2019 14:56   DG Chest Port 1 View  Result Date: 08/22/2019 CLINICAL DATA:  Unresponsive, tachycardia, hyperglycemia EXAM: PORTABLE CHEST 1 VIEW COMPARISON:  08/11/2019 FINDINGS: 2 frontal views of the chest demonstrate a stable cardiac silhouette. Postsurgical changes from bypass surgery. There are persistent but improving bibasilar airspace opacities, left greater than right. No effusion or pneumothorax. No acute bony abnormalities. IMPRESSION: 1. Persistent but improving bibasilar airspace disease, consistent with multifocal pneumonia. Electronically Signed   By: Randa Ngo M.D.   On: 08/22/2019 03:57   DG CHEST PORT 1 VIEW  Result Date: 08/07/2019 CLINICAL DATA:  Hypoxia EXAM: PORTABLE CHEST 1 VIEW COMPARISON:  August 04, 2019 FINDINGS: There is new airspace consolidation in the left mid and lower lung zones as well as in the right upper lobe and right base. Heart is upper normal in size  with pulmonary vascularity normal. Patient is status post coronary artery bypass grafting. No adenopathy. No bone lesions. IMPRESSION: Multifocal airspace opacity, new from prior study. Appearance consistent with multifocal pneumonia. Stable  cardiac silhouette. Electronically Signed   By: Lowella Grip III M.D.   On: 08/07/2019 08:40   DG Abd Portable 2V  Result Date: 08/09/2019 CLINICAL DATA:  COVID-19 positive. EXAM: PORTABLE ABDOMEN - 2 VIEW COMPARISON:  None. FINDINGS: The bowel gas pattern is normal. There is no evidence of free air. No radio-opaque calculi or other significant radiographic abnormality is seen. IMPRESSION: No evidence of bowel obstruction or ileus. Electronically Signed   By: Marijo Conception M.D.   On: 08/09/2019 12:08   ECHOCARDIOGRAM COMPLETE  Result Date: 08/04/2019    ECHOCARDIOGRAM REPORT   Patient Name:   TSUTOMU BARFOOT Date of Exam: 08/04/2019 Medical Rec #:  [id number]   Height:       67.0 in Accession #:    [id number]  Weight:       187.4 lb Date of Birth:  [birth year]  BSA:          1.967 m Patient Age:    84 years    BP:           166/71 mmHg Patient Gender: M           HR:           75 bpm. Exam Location:  Inpatient Procedure: 2D Echo Indications:    Elevated Troponin  History:        Patient has no prior history of Echocardiogram examinations.                 Risk Factors:Diabetes and Hypertension. CKD                 Covid19 infection.  Sonographer:    Vikki Ports Turrentine Referring Phys: Forbes  1. Left ventricular ejection fraction, by estimation, is 55 to 60%. The left ventricle has normal function. The left ventricle has no regional wall motion abnormalities. There is moderate concentric left ventricular hypertrophy. Left ventricular diastolic parameters are indeterminate.  2. Right ventricular systolic function is normal. The right ventricular size is normal.  3. The mitral valve is normal in structure. Trivial mitral valve regurgitation. No evidence of mitral stenosis.  4. The aortic valve has an indeterminant number of cusps. Aortic valve regurgitation is not visualized. No aortic stenosis is present.  5. The inferior vena cava is normal in size with greater than 50% respiratory  variability, suggesting right atrial pressure of 3 mmHg. Comparison(s): No prior Echocardiogram. FINDINGS  Left Ventricle: Left ventricular ejection fraction, by estimation, is 55 to 60%. The left ventricle has normal function. The left ventricle has no regional wall motion abnormalities. The left ventricular internal cavity size was normal in size. There is  moderate concentric left ventricular hypertrophy. Left ventricular diastolic parameters are indeterminate. Right Ventricle: The right ventricular size is normal. No increase in right ventricular wall thickness. Right ventricular systolic function is normal. Left Atrium: Left atrial size was normal in size. Right Atrium: Right atrial size was normal in size. Pericardium: There is no evidence of pericardial effusion. Mitral Valve: The mitral valve is normal in structure. Trivial mitral valve regurgitation. No evidence of mitral valve stenosis. Tricuspid Valve: The tricuspid valve is normal in structure. Tricuspid valve regurgitation is trivial. No evidence of tricuspid stenosis. Aortic Valve: The aortic valve has an indeterminant number of cusps. Aortic valve  regurgitation is not visualized. No aortic stenosis is present. There is mild calcification of the aortic valve. Pulmonic Valve: The pulmonic valve was not well visualized. Pulmonic valve regurgitation is not visualized. No evidence of pulmonic stenosis. Aorta: The aortic root, ascending aorta and aortic arch are all structurally normal, with no evidence of dilitation or obstruction. Pulmonary Artery: The pulmonary artery is not well seen. Venous: The inferior vena cava is normal in size with greater than 50% respiratory variability, suggesting right atrial pressure of 3 mmHg. IAS/Shunts: No atrial level shunt detected by color flow Doppler.  LEFT VENTRICLE PLAX 2D LVIDd:         4.78 cm  Diastology LVIDs:         3.10 cm  LV e' lateral:   7.72 cm/s LV PW:         1.51 cm  LV E/e' lateral: 10.5 LV IVS:         1.76 cm  LV e' medial:    5.00 cm/s LVOT diam:     1.80 cm  LV E/e' medial:  16.2 LV SV:         50 LV SV Index:   25 LVOT Area:     2.54 cm  RIGHT VENTRICLE RV S prime:     15.10 cm/s TAPSE (M-mode): 1.8 cm LEFT ATRIUM             Index       RIGHT ATRIUM           Index LA diam:        2.80 cm 1.42 cm/m  RA Area:     12.80 cm LA Vol (A2C):   51.5 ml 26.18 ml/m RA Volume:   26.90 ml  13.68 ml/m LA Vol (A4C):   30.3 ml 15.40 ml/m LA Biplane Vol: 39.9 ml 20.28 ml/m  AORTIC VALVE LVOT Vmax:   111.00 cm/s LVOT Vmean:  78.600 cm/s LVOT VTI:    0.196 m  AORTA Ao Root diam: 3.40 cm MITRAL VALVE MV Area (PHT): 4.36 cm    SHUNTS MV Decel Time: 174 msec    Systemic VTI:  0.20 m MV E velocity: 81.00 cm/s  Systemic Diam: 1.80 cm MV A velocity: 92.40 cm/s MV E/A ratio:  0.88 Buford Dresser MD Electronically signed by Buford Dresser MD Signature Date/Time: 08/04/2019/6:42:54 PM    Final    CT RENAL STONE STUDY  Result Date: 08/04/2019 CLINICAL DATA:  Flank pain with kidney stone suspected. EXAM: CT ABDOMEN AND PELVIS WITHOUT CONTRAST TECHNIQUE: Multidetector CT imaging of the abdomen and pelvis was performed following the standard protocol without IV contrast. COMPARISON:  12/07/2018 FINDINGS: Lower chest: Patchy airspace disease in the lower lungs. Coronary atherosclerosis. Hepatobiliary: No focal liver abnormality.Minimal layering high-density in the gallbladder consistent with calculi. No wall thickening or inflammatory changes. Pancreas: Unremarkable. Spleen: Unremarkable. Adrenals/Urinary Tract: Negative adrenals. No hydronephrosis or stone. Hyperdense mass at the lower pole right kidney that is stable at 19 mm, consistent with hemorrhagic cyst given density. There is thinning of the cortex at the upper pole on the left where there is a subtle cystic density. Minimal layering calcification in the urinary bladder towards the left, not mural calcification based on prior. Stomach/Bowel:  No  obstruction. No appendicitis. Vascular/Lymphatic: No acute vascular abnormality. Diffuse atherosclerotic calcification of the aorta and branch vessels. No mass or adenopathy. Reproductive:Negative Other: No ascites or pneumoperitoneum. Musculoskeletal: Spondylosis without acute finding These results will be called to the ordering clinician or  representative by the Radiologist Assistant, and communication documented in the PACS or [employer]. IMPRESSION: 1. Patchy airspace disease in the lower lungs with inflammatory appearance. Please correlate with COVID-19 status. 2. No acute intra-abdominal finding. 3. Small volume layering calculi in the gallbladder and urinary bladder. 4.  Aortic Atherosclerosis (ICD10-I70.0).  Coronary atherosclerosis Electronically Signed   By: Monte Fantasia M.D.   On: 08/04/2019 05:11   VAS [place] LOWER EXTREMITY VENOUS (DVT)  Result Date: 08/08/2019  Lower Venous DVTStudy Indications: Hypoxia, covid+.  Comparison Study: no prior Performing Technologist: Abram Sander RVS  Examination Guidelines: A complete evaluation includes B-mode imaging, spectral Doppler, color Doppler, and power Doppler as needed of all accessible portions of each vessel. Bilateral testing is considered an integral part of a complete examination. Limited examinations for reoccurring indications may be performed as noted. The reflux portion of the exam is performed with the patient in reverse Trendelenburg.  +---------+---------------+---------+-----------+----------+--------------+ RIGHT    CompressibilityPhasicitySpontaneityPropertiesThrombus Aging +---------+---------------+---------+-----------+----------+--------------+ CFV      Full           Yes      Yes                                 +---------+---------------+---------+-----------+----------+--------------+ SFJ      Full                                                         +---------+---------------+---------+-----------+----------+--------------+ FV Prox  Full                                                        +---------+---------------+---------+-----------+----------+--------------+ FV Mid   Full                                                        +---------+---------------+---------+-----------+----------+--------------+ FV DistalFull                                                        +---------+---------------+---------+-----------+----------+--------------+ PFV      Full                                                        +---------+---------------+---------+-----------+----------+--------------+ POP      Full           Yes      Yes                                 +---------+---------------+---------+-----------+----------+--------------+ PTV      Full                                                        +---------+---------------+---------+-----------+----------+--------------+  PERO     Full                                                        +---------+---------------+---------+-----------+----------+--------------+   +---------+---------------+---------+-----------+----------+--------------+ LEFT     CompressibilityPhasicitySpontaneityPropertiesThrombus Aging +---------+---------------+---------+-----------+----------+--------------+ CFV      Full           Yes      Yes                                 +---------+---------------+---------+-----------+----------+--------------+ SFJ      Full                                                        +---------+---------------+---------+-----------+----------+--------------+ FV Prox  Full                                                        +---------+---------------+---------+-----------+----------+--------------+ FV Mid   Full                                                         +---------+---------------+---------+-----------+----------+--------------+ FV DistalFull                                                        +---------+---------------+---------+-----------+----------+--------------+ PFV      Full                                                        +---------+---------------+---------+-----------+----------+--------------+ POP      Full           Yes      Yes                                 +---------+---------------+---------+-----------+----------+--------------+ PTV      Full                                                        +---------+---------------+---------+-----------+----------+--------------+ PERO     Full                                                        +---------+---------------+---------+-----------+----------+--------------+  Summary: BILATERAL: - No evidence of deep vein thrombosis seen in the lower extremities, bilaterally.   *See table(s) above for measurements and observations. Electronically signed by Curt Jews MD on 08/08/2019 at 8:17:03 AM.    Final

## 2019-08-23 NOTE — Progress Notes (Signed)
[place] for heparin Indication: r/o pulmonary embolus, r/o ACS  Allergies  Allergen Reactions  . Iodine Hives    Patient Measurements: Height: 5\' 7"  (170.2 cm) Weight: 70.3 kg (155 lb) IBW/kg (Calculated) : 66.1 Heparin Dosing Weight: 70.3  Vital Signs: Temp: 98.5 F (36.9 C) (04/18 2326) Temp Source: Oral (04/18 2326) BP: 134/63 (04/18 2326) Pulse Rate: 73 (04/18 2326)  Labs: Recent Labs    08/22/19 0302 08/22/19 0302 08/22/19 0706 08/22/19 1424 08/22/19 1827 08/22/19 2206 08/23/19 0235  HGB 12.5*  --   --   --   --   --  9.4*  HCT 39.8  --   --   --   --   --  28.8*  PLT 207  --   --   --   --   --  163  APTT 29  --   --   --   --   --   --   LABPROT 16.4*  --   --   --   --   --   --   INR 1.3*  --   --   --   --   --   --   HEPARINUNFRC  --   --   --  0.86*  --   --  0.66  CREATININE 1.64*  --   --   --   --   --  1.83*  TROPONINIHS 143*   < > 172*  --  227* 190*  --    < > = values in this interval not displayed.    Estimated Creatinine Clearance: 38.6 mL/min (A) (by C-G formula based on SCr of 1.83 mg/dL (H)).  Assessment: Patient presented with AMS after being found unresponsive at home secondary to hypoglycemia. He has a recent diagnosis of COVID and was noted to be hypoxic in ED, with his recent diagnosis of COVID, suspect possible PE. Also with elevated troponins.  Heparin level therapeutic (0.66) on gtt at 1050 units/hr. No bleeding noted. CT negative for PE and dopplers neg for DVT. Troponin 190 Hgb down to 9.4 - watch closely.  Goal of Therapy:  Heparin level 0.3-0.7 units/ml Monitor platelets by anticoagulation protocol: Yes   Plan Continue heparin at 1050 units/hr Will f/u cardiac enzymes and ability to d/c heparin or further workup  Sherlon Handing, PharmD, BCPS Please see amion for complete clinical pharmacist phone list 08/23/2019 3:45 AM

## 2019-08-23 NOTE — Progress Notes (Signed)
Pharmacy Antibiotic Note  Lucas Hobbs is a 64 y.o. male admitted on 08/22/2019 with pneumonia.  Pharmacy has been consulted for Cefepime dosing.  Plan: Cefepime 2 gm IV q12h Will f/u renal function, micro data, and pt's clinical conditio   Height: 5\' 7"  (170.2 cm) Weight: 70.3 kg (155 lb) IBW/kg (Calculated) : 66.1  Temp (24hrs), Avg:98.7 F (37.1 C), Min:98.5 F (36.9 C), Max:99.1 F (37.3 C)  Recent Labs  Lab 08/16/19 0948 08/18/19 1219 08/18/19 1220 08/22/19 0302 08/22/19 0710 08/23/19 0235  WBC 10.4 20.8*  --  14.9*  --  10.1  CREATININE 1.48* 1.91*  --  1.64*  --  1.83*  LATICACIDVEN  --   --  2.2* 1.8 1.2  --     Estimated Creatinine Clearance: 38.6 mL/min (A) (by C-G formula based on SCr of 1.83 mg/dL (H)).    Allergies  Allergen Reactions  . Iodine Hives    Antimicrobials this admission: Rocephin 4/18>>4/19 Doxy 4/18 >> Cefepime 4/19 >>  Microbiology results: 4/18 BCx: 4/18 UCx:  MRSA PCR:   Thank you for allowing pharmacy to be a part of this patient's care.  Sherlon Handing, PharmD, BCPS Please see amion for complete clinical pharmacist phone list 08/23/2019 7:40 AM

## 2019-08-24 LAB — COMPREHENSIVE METABOLIC PANEL
ALT: 29 U/L (ref 0–44)
AST: 21 U/L (ref 15–41)
Albumin: 1.8 g/dL — ABNORMAL LOW (ref 3.5–5.0)
Alkaline Phosphatase: 46 U/L (ref 38–126)
Anion gap: 10 (ref 5–15)
BUN: 37 mg/dL — ABNORMAL HIGH (ref 8–23)
CO2: 24 mmol/L (ref 22–32)
Calcium: 7.8 mg/dL — ABNORMAL LOW (ref 8.9–10.3)
Chloride: 104 mmol/L (ref 98–111)
Creatinine, Ser: 1.75 mg/dL — ABNORMAL HIGH (ref 0.61–1.24)
GFR calc Af Amer: 47 mL/min — ABNORMAL LOW (ref >60)
GFR calc non Af Amer: 41 mL/min — ABNORMAL LOW (ref >60)
Glucose, Bld: 260 mg/dL — ABNORMAL HIGH (ref 70–99)
Potassium: 4.3 mmol/L (ref 3.5–5.1)
Sodium: 138 mmol/L (ref 135–145)
Total Bilirubin: 0.6 mg/dL (ref 0.3–1.2)
Total Protein: 4.6 g/dL — ABNORMAL LOW (ref 6.5–8.1)

## 2019-08-24 LAB — CBC
HCT: 31.4 % — ABNORMAL LOW (ref 39.0–52.0)
Hemoglobin: 10.3 g/dL — ABNORMAL LOW (ref 13.0–17.0)
MCH: 28.9 pg (ref 26.0–34.0)
MCHC: 32.8 g/dL (ref 30.0–36.0)
MCV: 88 fL (ref 80.0–100.0)
Platelets: 178 10*3/uL (ref 150–400)
RBC: 3.57 MIL/uL — ABNORMAL LOW (ref 4.22–5.81)
RDW: 13.2 % (ref 11.5–15.5)
WBC: 7.2 10*3/uL (ref 4.0–10.5)
nRBC: 0 % (ref 0.0–0.2)

## 2019-08-24 LAB — MAGNESIUM: Magnesium: 2 mg/dL (ref 1.7–2.4)

## 2019-08-24 LAB — GLUCOSE, CAPILLARY
Glucose-Capillary: 202 mg/dL — ABNORMAL HIGH (ref 70–99)
Glucose-Capillary: 265 mg/dL — ABNORMAL HIGH (ref 70–99)
Glucose-Capillary: 203 mg/dL — ABNORMAL HIGH (ref 70–99)
Glucose-Capillary: 243 mg/dL — ABNORMAL HIGH (ref 70–99)

## 2019-08-24 MED ORDER — PREDNISONE 20 MG PO TABS
30.0000 mg | ORAL_TABLET | Freq: Every day | ORAL | Status: DC
  Administered 2019-08-25: 30 mg via ORAL
  Filled 2019-08-24: qty 1

## 2019-08-24 MED ORDER — INSULIN GLARGINE 100 UNIT/ML ~~LOC~~ SOLN
8.0000 [IU] | Freq: Every day | SUBCUTANEOUS | Status: DC
  Administered 2019-08-24 – 2019-08-26 (×3): 8 [IU] via SUBCUTANEOUS
  Filled 2019-08-24 (×3): qty 0.08

## 2019-08-24 NOTE — Progress Notes (Signed)
PROGRESS NOTE                                                                                                                                                                                                             Patient Demographics:    Lucas Hobbs, is a 64 y.o. male, DOB - 1955/12/17, OIN:[id number] [email or web address] Outpatient Primary MD for the patient is Nelwyn Salisbury, Hershal Coria   Admit date - 08/22/2019   LOS - 2  Chief Complaint  Patient presents with  . Altered Mental Status  . Hypoglycemia       Brief Narrative: Patient is a 64 y.o. male with PMHx of insulin-dependent DM-2, CKD stage IV, CVA, CAD s/p CABG, HTN who was hospitalized from 3/30-4/13 due to hypoxia from Covid 19 pneumonia, he returned to the emergency room due to severe hypoglycemia, acute metabolic encephalopathy and was found to be hypoxemic likely secondary to HCAP.  See below for further details.  Significant Events: 4/18>> admit to [facility] for hypoglycemia, metabolic encephalopathy and hypoxemia 4/18>> MRI brain: No acute infarct 3/30-4/13 admit for hypoxia from Covid 19 pneumonia.  COVID-19 medications: Steroids: 3/31>> 4/9 Remdesivir: 3/31>> 4/4  Antibiotics: Cefepime: 4/19>> Doxycycline: 4/18>> Ceftriaxone: 4/18 x 1  Microbiology data: 4/18: Blood culture>> no growth 4/18: Urine culture>> no growth  DVT prophylaxis: IV heparin>> SQ Lovenox  Procedures: None  Consults: None    Subjective:   No major events overnight-continues to be slightly confused-answer some questions appropriately.  CBG stable overnight   Assessment  & Plan :   Hypoglycemia: Secondary to excessive insulin administration-his dosage of insulin was decreased during his most recent hospitalization.  No further hypoglycemic episodes.  Acute metabolic  encephalopathy: Suspect secondary to above-MRI brain on 4/19-no acute intracranial abnormality-chronic multifocal infarcts in the right MCA.  Per patient spouse-patient has had memory issues for the past several years-suspect he may have some amount of undiagnosed dementia at baseline.  Patient is improved and currently back to baseline.  Acute hypoxemic respiratory failure: Multifactorial-suspect he has some residual Covid pneumonitis-probable bacterial coinfection as well as procalcitonin mildly elevated.  Continue cefepime/doxycycline-since CRP elevated-we will plan on a short course of steroids.  No longer on IV heparin has CT chest negative for PE.  ?Complicated UTI: Does not have UTI-urine cultures negative.  On empiric antibiotics for possible bacterial pneumonia.  Minimally elevated troponin: Suspect from underlying CKD/demand ischemia from acute illness-trend is flat-not consistent with ACS.-He does have a history of CAD and is s/p CABG.  History of acute urinary retention during his most recent hospitalization Foley catheter in place: Remains on Flomax-DC Foley catheter today-we will attempt a voiding trial.    HTN: BP controlled-continue amlodipine and metoprolol  CAD s/p CABG: No anginal symptoms-continue aspirin, statin, metoprolol  History of CVA seen on imaging studies: Nonfocal exam-MRI with no acute CVA-continue aspirin/statin.  Per patient spouse-she is not aware of a prior diagnosis of CVA for which the patient was admitted to the hospital with.  Could have been asymptomatic  QTC prolongation: Resolved-after correction of hypomagnesemia and hypokalemia.    Avoid QTC prolonging agents  Nausea/vomiting: Resolved-we will discontinue scopolamine patch-and transition to as needed Zofran noted QTC has improved.    DM-2 (A1c 7.5 on 08/05/2019): No further hypoglycemic episodes-CBGs on the higher side as patient on low-dose steroids-start Lantus 8 units-continue SSI.  Follow and  adjust.  CBG (last 3)  Recent Labs    08/23/19 1721 08/24/19 0733 08/24/19 1213  GLUCAP 228* 202* 265*    CKD stage IV: Creatinine close to baseline-monitor periodically-avoid nephrotoxic agents.  ?Dementia/cognitive dysfunction: Spouse reports several years of memory issues/confusion-probably has some amount of lingering encephalopathy from recent Covid infection hypoxemia/hypoglycemia.  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  Have asked spouse to make appointment with neurology in the outpatient setting when he has recovered from his acute illness.  Check TSH, RPR, vitamin B12  Hx of seminoma s/p Left orchiectomy  ABG: No results found for: PHART, PCO2ART, PO2ART, HCO3, TCO2, ACIDBASEDEF, O2SAT  Vent Settings: N/A    Condition - Stable  Family Communication  :  Spouse updated over the phone 4/20  Code Status :  Full Code  Diet :  Diet Order            Diet heart healthy/carb modified Room service appropriate? Yes; Fluid consistency: Thin  Diet effective now               Disposition Plan  :  Remain hospitalized-may require SNF  Barriers to discharge: Hypoxia requiring O2 supplementation/on IV antibiotics  Antimicorbials  :    Anti-infectives (From admission, onward)   Start     Dose/Rate Route Frequency Ordered Stop   08/23/19 2200  doxycycline (VIBRA-TABS) tablet 100 mg     100 mg Oral Every 12 hours 08/23/19 1219 08/26/19 2159   08/23/19 0800  ceFEPIme (MAXIPIME) 2 g in sodium chloride 0.9 % 100 mL IVPB     2 g 200 mL/hr over 30 Minutes Intravenous Every 12 hours 08/23/19 0757 08/28/19 0759   08/22/19 1145  doxycycline (VIBRAMYCIN) 100 mg in sodium chloride 0.9 % 250 mL IVPB  Status:  Discontinued     100 mg 125 mL/hr over 120 Minutes Intravenous Every 12 hours 08/22/19 1144 08/23/19 1219   08/22/19 1000  cefTRIAXone  (ROCEPHIN) 2 g in sodium chloride 0.9 % 100 mL IVPB  Status:  Discontinued     2 g 200 mL/hr over 30 Minutes Intravenous Every 24 hours 08/22/19 0811 08/23/19 0724      Inpatient Medications  Scheduled Meds: . amLODipine  10 mg Oral Daily  . aspirin  81 mg Oral Daily  . atorvastatin  40 mg Oral QPM  . Chlorhexidine Gluconate Cloth  6 each Topical Daily  . doxycycline  100 mg Oral Q12H  . guaiFENesin  600 mg Oral BID  . heparin injection (subcutaneous)  5,000 Units Subcutaneous Q8H  . insulin aspart  0-15 Units Subcutaneous TID WC  . metoprolol succinate  50 mg Oral QPM  . predniSONE  40 mg Oral Q breakfast  . scopolamine  1 patch Transdermal Q72H  . sodium chloride flush  3 mL Intravenous Q12H  . tamsulosin  0.4 mg Oral Daily   Continuous Infusions: . ceFEPime (MAXIPIME) IV 2 g (08/24/19 0853)   PRN Meds:.acetaminophen **OR** acetaminophen, albuterol, phenol   Time Spent in minutes  35    See all Orders from today for further details   Oren Binet M.D on 08/24/2019 at 12:09 PM  To page go to [email or web address] - use universal password  [facility] -  Office  [phone]    Objective:   Vitals:   08/24/19 0021 08/24/19 0439 08/24/19 0734 08/24/19 0800  BP: 128/83 (!) 149/68 (!) 152/71 138/65  Pulse: 65 67 66 63  Resp: 16 16 18 17   Temp: 98.4 F (36.9 C) 98 F (36.7 C) (!) 97.5 F (36.4 C) 98 F (36.7 C)  TempSrc: Oral Oral Oral Oral  SpO2: 93% 93% 93% 96%  Weight:      Height:        Wt Readings from Last 3 Encounters:  08/22/19 70.3 kg  08/18/19 70.3 kg  08/17/19 70.3 kg     Intake/Output Summary (Last 24 hours) at 08/24/2019 1209 Last data filed at 08/24/2019 0800 Gross per 24 hour  Intake 640 ml  Output 1850 ml  Net -1210 ml     Physical Exam Gen Exam:alert-but still somewhat confused HEENT:atraumatic, normocephalic Chest: B/L clear to auscultation anteriorly CVS:S1S2 regular Abdomen:soft non tender, non distended Extremities:no  edema Neurology: Non focal Skin: no rash   Data Review:    CBC Recent Labs  Lab 08/18/19 1219 08/22/19 0302 08/23/19 0235 08/24/19 0315  WBC 20.8* 14.9* 10.1 7.2  HGB 13.2 12.5* 9.4* 10.3*  HCT 40.3 39.8 28.8* 31.4*  PLT 157 207 163 178  MCV 87.6 89.6 88.6 88.0  MCH 28.7 28.2 28.9 28.9  MCHC 32.8 31.4 32.6 32.8  RDW 13.4 13.9 13.8 13.2  LYMPHSABS 0.4* 0.3*  --   --   MONOABS 1.1* 0.7  --   --   EOSABS 0.0 0.0  --   --   BASOSABS 0.0 0.0  --   --     Chemistries  Recent Labs  Lab 08/18/19 1219 08/22/19 0302 08/23/19 0235 08/24/19 0315  NA 143 146* 140 138  K 4.1 3.9 3.6 4.3  CL 105 109 105 104  CO2 23 27 25 24   GLUCOSE 230* 84 226* 260*  BUN 35* 31* 33* 37*  CREATININE 1.91* 1.64* 1.83* 1.75*  CALCIUM 8.7* 8.3* 7.5* 7.8*  MG  --   --   --  2.0  AST 54* 39  --  21  ALT 61* 46*  --  29  ALKPHOS 57 58  --  46  BILITOT 1.2 1.1  --  0.6   ------------------------------------------------------------------------------------------------------------------ No results for input(s): CHOL, HDL, LDLCALC, TRIG, CHOLHDL, LDLDIRECT in the last 72 hours.  Lab Results  Component Value Date   HGBA1C 7.5 (H) 08/05/2019   ------------------------------------------------------------------------------------------------------------------ No results for input(s): TSH, T4TOTAL, T3FREE, THYROIDAB in the last 72 hours.  Invalid input(s): FREET3 ------------------------------------------------------------------------------------------------------------------ No results for input(s): VITAMINB12, FOLATE, FERRITIN, TIBC, IRON, RETICCTPCT  in the last 72 hours.  Coagulation profile Recent Labs  Lab 08/22/19 0302  INR 1.3*    No results for input(s): DDIMER in the last 72 hours.  Cardiac Enzymes No results for input(s): CKMB, TROPONINI, MYOGLOBIN in the last 168 hours.  Invalid input(s):  CK ------------------------------------------------------------------------------------------------------------------    Component Value Date/Time   BNP 178.9 (H) 08/23/2019 0726    Micro Results Recent Results (from the past 240 hour(s))  Culture, Urine     Status: None   Collection Time: 08/14/19 12:29 PM   Specimen: Urine, Catheterized  Result Value Ref Range Status   Specimen Description   Final    URINE, CATHETERIZED Performed at [facility] [address], [place], [place] [address]    Special Requests   Final    Normal Performed at [facility], [place] [address], [place], [place] [address]    Culture   Final    NO GROWTH Performed at [facility], [place] [address] [place], [place] [address]    Report Status 08/15/2019 FINAL  Final  Blood Culture (routine x 2)     Status: None (Preliminary result)   Collection Time: 08/22/19  3:46 AM   Specimen: BLOOD RIGHT ARM  Result Value Ref Range Status   Specimen Description BLOOD RIGHT ARM  Final   Special Requests   Final    BOTTLES DRAWN AEROBIC AND ANAEROBIC Blood Culture adequate volume   Culture   Final    NO GROWTH 2 DAYS Performed at [facility], [place] [address] [place], [place] [address]    Report Status PENDING  Incomplete  Urine culture     Status: None   Collection Time: 08/22/19  5:29 AM   Specimen: In/Out Cath Urine  Result Value Ref Range Status   Specimen Description IN/OUT CATH URINE  Final   Special Requests NONE  Final   Culture   Final    NO GROWTH Performed at [facility], [place] [address], [place], [place] [address]    Report Status 08/23/2019 FINAL  Final  Blood Culture (routine x 2)     Status: None (Preliminary result)   Collection Time: 08/22/19  7:10 AM   Specimen: BLOOD LEFT WRIST  Result Value Ref Range Status   Specimen Description BLOOD LEFT WRIST  Final   Special Requests   Final    BOTTLES DRAWN AEROBIC AND ANAEROBIC Blood Culture  results may not be optimal due to an inadequate volume of blood received in culture bottles   Culture   Final    NO GROWTH 2 DAYS Performed at [facility], [place] [address], [place],  [address]    Report Status PENDING  Incomplete    Radiology Reports DG Chest 1 View  Result Date: 08/11/2019 CLINICAL DATA:  COVID-19 positive, hypoxemia. EXAM: CHEST  1 VIEW COMPARISON:  August 07, 2019. FINDINGS: Stable cardiomediastinal silhouette. Sternotomy wires are noted. No pneumothorax or pleural effusion is noted. Bilateral lung opacities are decreased compared to prior exam suggesting improving multifocal pneumonia. Bony thorax is unremarkable. IMPRESSION: Improving bilateral lung opacities are noted suggesting improving multifocal pneumonia. Electronically Signed   By: Marijo Conception M.D.   On: 08/11/2019 15:57   DG Chest 2 View  Result Date: 08/03/2019 CLINICAL DATA:  Vomiting, short of breath, hiccups EXAM: CHEST - 2 VIEW COMPARISON:  None. FINDINGS: Frontal and lateral views of the chest demonstrate an unremarkable cardiac silhouette. Postsurgical changes  are seen from bypass surgery. No airspace disease, effusion, or pneumothorax. No acute bony abnormalities. IMPRESSION: 1. No acute intrathoracic process. Electronically Signed   By: Randa Ngo M.D.   On: 08/03/2019 23:22   DG Abd 1 View  Result Date: 08/14/2019 CLINICAL DATA:  Nausea vomiting EXAM: ABDOMEN - 1 VIEW COMPARISON:  Abdominal film 08/09/2019 FINDINGS: Post median sternotomy. Lung bases with scattered airspace disease similar to prior chest x-ray for abdominal series. No signs of bowel obstruction. Stool and gas throughout the colon. Stool and rectal gas. No acute bone finding. No signs of abnormal calcification. IMPRESSION: Scattered basilar airspace disease better assessed on previous dedicated chest radiography. No signs of bowel obstruction. Electronically Signed   By: Zetta Bills M.D.   On: 08/14/2019 15:34   CT HEAD  WO CONTRAST  Result Date: 08/06/2019 CLINICAL DATA:  Delirium EXAM: CT HEAD WITHOUT CONTRAST TECHNIQUE: Contiguous axial images were obtained from the base of the skull through the vertex without intravenous contrast. COMPARISON:  None. FINDINGS: Brain: No acute hemorrhage, acute infarction or space-occupying mass lesion is seen. Multifocal infarcts are noted on the right with encephalomalacia predominately within the distribution of the right middle cerebral artery. Vascular: No hyperdense vessel or unexpected calcification. Skull: Normal. Negative for fracture or focal lesion. Sinuses/Orbits: Mucosal retention cyst is noted in the left maxillary antrum. 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 No acute abnormality noted. Electronically Signed   By: Inez Catalina M.D.   On: 08/06/2019 20:16   CT Angio Chest PE W and/or Wo Contrast  Result Date: 08/22/2019 CLINICAL DATA:  Hypoxemia EXAM: CT ANGIOGRAPHY CHEST WITH CONTRAST TECHNIQUE: Multidetector CT imaging of the chest was performed using the standard protocol during bolus administration of intravenous contrast. Multiplanar CT image reconstructions and MIPs were obtained to evaluate the vascular anatomy. CONTRAST:  79mL OMNIPAQUE IOHEXOL 350 MG/ML SOLN COMPARISON:  None. FINDINGS: Cardiovascular: There is no pulmonary embolism identified within the main, lobar or segmental pulmonary arteries bilaterally. Aortic atherosclerosis. No thoracic aortic aneurysm. No pericardial effusion. Three-vessel coronary artery atherosclerosis. Median sternotomy for presumed CABG. Mediastinum/Nodes: No mass or enlarged lymph nodes within the mediastinum or perihilar regions. Esophagus appears normal. Trachea appears normal. Lungs/Pleura: Patchy nodular consolidations and ground-glass opacities throughout both lungs, most prominent at the lung bases, consistent with multifocal pneumonia. No pleural  effusion or pneumothorax. Upper Abdomen: Limited images of the upper abdomen are unremarkable. Musculoskeletal: Median sternotomy wires in place. No acute or suspicious osseous finding. Review of the MIP images confirms the above findings. IMPRESSION: 1. Patchy nodular consolidations and ground-glass opacities throughout both lungs, most prominent at the lung bases, most likely multifocal pneumonia. COVID 19 pneumonia can have this appearance. Differential includes atypical pneumonias such as viral or fungal, interstitial pneumonias, edema related to volume overload/CHF, chronic interstitial diseases, hypersensitivity pneumonitis, and respiratory bronchiolitis. 2. No pulmonary embolism seen. Aortic Atherosclerosis (ICD10-I70.0). Electronically Signed   By: Franki Cabot M.D.   On: 08/22/2019 14:24   MR BRAIN WO CONTRAST  Result Date: 08/23/2019 CLINICAL DATA:  64 year old male with subacute neurologic deficits. Delirium earlier this month. EXAM: MRI HEAD WITHOUT CONTRAST TECHNIQUE: Multiplanar, multiecho pulse sequences of the brain and surrounding structures were obtained without intravenous contrast. COMPARISON:  Head CT 08/06/2019. FINDINGS: Brain: Multifocal right hemisphere developing encephalomalacia with areas of laminar necrosis and hemosiderin. These are within the right MCA territory and correspond to the abnormal hypodensity on CT earlier this month.  None of these areas is definitely restricted on diffusion. No restricted diffusion to suggest acute infarction. No midline shift, mass effect, evidence of mass lesion, ventriculomegaly, extra-axial collection or acute intracranial hemorrhage. Cervicomedullary junction and pituitary are within normal limits. Outside of the right MCA territory there is patchy bilateral cerebral white matter T2 and FLAIR hyperintensity, and occasional chronic micro hemorrhages (posterior left centrum semiovale series 14, image 40). But no other cortical encephalomalacia.  However, there is evidence of chronic lacunar infarcts in the right basal ganglia and thalamus. Brainstem and cerebellum appear negative. Vascular: Major intracranial vascular flow voids are preserved, the left vertebral artery appears dominant. Skull and upper cervical spine: Negative visible cervical spine and bone marrow signal. Sinuses/Orbits: Negative orbits. Trace paranasal sinus mucosal thickening. Other: Trace bilateral mastoid air cell fluid, probably postinflammatory with negative visible nasopharynx. Other visible internal auditory structures appear normal. Negative scalp and face soft tissues. IMPRESSION: 1. No acute intracranial abnormality. 2. 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 Associated laminar necrosis, hemosiderin, and developing encephalomalacia. 3. Superimposed chronic small vessel disease in the right deep gray nuclei and bilateral white matter with occasional other chronic microhemorrhages. Electronically Signed   By: Genevie Ann M.D.   On: 08/23/2019 05:23   US Abdomen Complete  Result Date: 08/04/2019 CLINICAL DATA:  Nausea and vomiting for 7 days EXAM: ABDOMEN ULTRASOUND COMPLETE COMPARISON:  CT from earlier today FINDINGS: Gallbladder: Cholelithiasis. No wall thickening or focal tenderness. Common bile duct: Diameter: 4 mm Liver: No focal lesion identified. Within normal limits in parenchymal echogenicity. Portal vein is patent on color Doppler imaging with normal direction of blood flow towards the liver. IVC: No abnormality visualized. Pancreas: Obscured. Spleen: Size and appearance within normal limits. Right Kidney: Length: 12 cm. Right lower pole cyst measuring 19 mm, correlating with hemorrhagic cyst on prior CT. A thin benign-appearing septation is present. Left Kidney: Length: 11.6 cm. Echogenicity within normal limits. No mass or hydronephrosis visualized. Abdominal aorta: No aneurysm visualized.   Atherosclerosis IMPRESSION: 1. No additional finding when compared to CT earlier the same day. 2. Cholelithiasis. Electronically Signed   By: Monte Fantasia M.D.   On: 08/04/2019 06:38   US RENAL  Result Date: 08/10/2019 CLINICAL DATA:  Acute renal failure EXAM: RENAL / URINARY TRACT ULTRASOUND COMPLETE COMPARISON:  Ultrasound abdomen 08/04/2019 FINDINGS: Right Kidney: Renal measurements: 10.2 x 5.2 x 6.8 cm = volume: 190 mL. Negative for renal obstruction. Several small renal cysts are present largest measuring 1.5 cm. Normal cortical echogenicity. Left Kidney: Renal measurements: 9.8 x 4.9 x 4.8 cm = volume: 121 mL. Echogenicity within normal limits. No mass or hydronephrosis visualized. Bladder: Normal bladder. The bladder is full with a Foley catheter inflated in the bladder. Other: None. IMPRESSION: No renal obstruction.  Small right renal cyst noted. Bladder is full with a Foley catheter in place. Electronically Signed   By: Franchot Gallo M.D.   On: 08/10/2019 14:56   DG Chest Port 1 View  Result Date: 08/22/2019 CLINICAL DATA:  Unresponsive, tachycardia, hyperglycemia EXAM: PORTABLE CHEST 1 VIEW COMPARISON:  08/11/2019 FINDINGS: 2 frontal views of the chest demonstrate a stable cardiac silhouette. Postsurgical changes from bypass surgery. There are persistent but improving bibasilar airspace opacities, left greater than right. No effusion or pneumothorax. No acute bony abnormalities. IMPRESSION: 1. Persistent but improving bibasilar airspace disease, consistent with multifocal pneumonia. Electronically Signed   By: Randa Ngo M.D.   On:  08/22/2019 03:57   DG CHEST PORT 1 VIEW  Result Date: 08/07/2019 CLINICAL DATA:  Hypoxia EXAM: PORTABLE CHEST 1 VIEW COMPARISON:  August 04, 2019 FINDINGS: There is new airspace consolidation in the left mid and lower lung zones as well as in the right upper lobe and right base. Heart is upper normal in size with pulmonary vascularity normal. Patient is status  post coronary artery bypass grafting. No adenopathy. No bone lesions. IMPRESSION: Multifocal airspace opacity, new from prior study. Appearance consistent with multifocal pneumonia. Stable cardiac silhouette. Electronically Signed   By: Lowella Grip III M.D.   On: 08/07/2019 08:40   DG Abd Portable 2V  Result Date: 08/09/2019 CLINICAL DATA:  COVID-19 positive. EXAM: PORTABLE ABDOMEN - 2 VIEW COMPARISON:  None. FINDINGS: The bowel gas pattern is normal. There is no evidence of free air. No radio-opaque calculi or other significant radiographic abnormality is seen. IMPRESSION: No evidence of bowel obstruction or ileus. Electronically Signed   By: Marijo Conception M.D.   On: 08/09/2019 12:08   ECHOCARDIOGRAM COMPLETE  Result Date: 08/04/2019    ECHOCARDIOGRAM REPORT   Patient Name:   LEEMON AYALA Date of Exam: 08/04/2019 Medical Rec #:  [id number]   Height:       67.0 in Accession #:    [id number]  Weight:       187.4 lb Date of Birth:  19-Jul-1955  BSA:          1.967 m Patient Age:    27 years    BP:           166/71 mmHg Patient Gender: M           HR:           75 bpm. Exam Location:  Inpatient Procedure: 2D Echo Indications:    Elevated Troponin  History:        Patient has no prior history of Echocardiogram examinations.                 Risk Factors:Diabetes and Hypertension. CKD                 Covid19 infection.  Sonographer:    Vikki Ports Turrentine Referring Phys: [place]  1. Left ventricular ejection fraction, by estimation, is 55 to 60%. The left ventricle has normal function. The left ventricle has no regional wall motion abnormalities. There is moderate concentric left ventricular hypertrophy. Left ventricular diastolic parameters are indeterminate.  2. Right ventricular systolic function is normal. The right ventricular size is normal.  3. The mitral valve is normal in structure. Trivial mitral valve regurgitation. No evidence of mitral stenosis.  4. The aortic valve has  an indeterminant number of cusps. Aortic valve regurgitation is not visualized. No aortic stenosis is present.  5. The inferior vena cava is normal in size with greater than 50% respiratory variability, suggesting right atrial pressure of 3 mmHg. Comparison(s): No prior Echocardiogram. FINDINGS  Left Ventricle: Left ventricular ejection fraction, by estimation, is 55 to 60%. The left ventricle has normal function. The left ventricle has no regional wall motion abnormalities. The left ventricular internal cavity size was normal in size. There is  moderate concentric left ventricular hypertrophy. Left ventricular diastolic parameters are indeterminate. Right Ventricle: The right ventricular size is normal. No increase in right ventricular wall thickness. Right ventricular systolic function is normal. Left Atrium: Left atrial size was normal in size. Right Atrium: Right atrial size was normal in size. Pericardium:  There is no evidence of pericardial effusion. Mitral Valve: The mitral valve is normal in structure. Trivial mitral valve regurgitation. No evidence of mitral valve stenosis. Tricuspid Valve: The tricuspid valve is normal in structure. Tricuspid valve regurgitation is trivial. No evidence of tricuspid stenosis. Aortic Valve: The aortic valve has an indeterminant number of cusps. Aortic valve regurgitation is not visualized. No aortic stenosis is present. There is mild calcification of the aortic valve. Pulmonic Valve: The pulmonic valve was not well visualized. Pulmonic valve regurgitation is not visualized. No evidence of pulmonic stenosis. Aorta: The aortic root, ascending aorta and aortic arch are all structurally normal, with no evidence of dilitation or obstruction. Pulmonary Artery: The pulmonary artery is not well seen. Venous: The inferior vena cava is normal in size with greater than 50% respiratory variability, suggesting right atrial pressure of 3 mmHg. IAS/Shunts: No atrial level shunt detected by  color flow Doppler.  LEFT VENTRICLE PLAX 2D LVIDd:         4.78 cm  Diastology LVIDs:         3.10 cm  LV e' lateral:   7.72 cm/s LV PW:         1.51 cm  LV E/e' lateral: 10.5 LV IVS:        1.76 cm  LV e' medial:    5.00 cm/s LVOT diam:     1.80 cm  LV E/e' medial:  16.2 LV SV:         50 LV SV Index:   25 LVOT Area:     2.54 cm  RIGHT VENTRICLE RV S prime:     15.10 cm/s TAPSE (M-mode): 1.8 cm LEFT ATRIUM             Index       RIGHT ATRIUM           Index LA diam:        2.80 cm 1.42 cm/m  RA Area:     12.80 cm LA Vol (A2C):   51.5 ml 26.18 ml/m RA Volume:   26.90 ml  13.68 ml/m LA Vol (A4C):   30.3 ml 15.40 ml/m LA Biplane Vol: 39.9 ml 20.28 ml/m  AORTIC VALVE LVOT Vmax:   111.00 cm/s LVOT Vmean:  78.600 cm/s LVOT VTI:    0.196 m  AORTA Ao Root diam: 3.40 cm MITRAL VALVE MV Area (PHT): 4.36 cm    SHUNTS MV Decel Time: 174 msec    Systemic VTI:  0.20 m MV E velocity: 81.00 cm/s  Systemic Diam: 1.80 cm MV A velocity: 92.40 cm/s MV E/A ratio:  0.88 Buford Dresser MD Electronically signed by Buford Dresser MD Signature Date/Time: 08/04/2019/6:42:54 PM    Final    CT RENAL STONE STUDY  Result Date: 08/04/2019 CLINICAL DATA:  Flank pain with kidney stone suspected. EXAM: CT ABDOMEN AND PELVIS WITHOUT CONTRAST TECHNIQUE: Multidetector CT imaging of the abdomen and pelvis was performed following the standard protocol without IV contrast. COMPARISON:  12/07/2018 FINDINGS: Lower chest: Patchy airspace disease in the lower lungs. Coronary atherosclerosis. Hepatobiliary: No focal liver abnormality.Minimal layering high-density in the gallbladder consistent with calculi. No wall thickening or inflammatory changes. Pancreas: Unremarkable. Spleen: Unremarkable. Adrenals/Urinary Tract: Negative adrenals. No hydronephrosis or stone. Hyperdense mass at the lower pole right kidney that is stable at 19 mm, consistent with hemorrhagic cyst given density. There is thinning of the cortex at the upper pole on  the left where there is a subtle cystic density. Minimal layering calcification  in the urinary bladder towards the left, not mural calcification based on prior. Stomach/Bowel:  No obstruction. No appendicitis. Vascular/Lymphatic: No acute vascular abnormality. Diffuse atherosclerotic calcification of the aorta and branch vessels. No mass or adenopathy. Reproductive:Negative Other: No ascites or pneumoperitoneum. Musculoskeletal: Spondylosis without acute finding These results will be called to the ordering clinician or representative by the Radiologist Assistant, and communication documented in the PACS or [employer]. IMPRESSION: 1. Patchy airspace disease in the lower lungs with inflammatory appearance. Please correlate with COVID-19 status. 2. No acute intra-abdominal finding. 3. Small volume layering calculi in the gallbladder and urinary bladder. 4.  Aortic Atherosclerosis (ICD10-I70.0).  Coronary atherosclerosis Electronically Signed   By: Monte Fantasia M.D.   On: 08/04/2019 05:11   VAS [place] LOWER EXTREMITY VENOUS (DVT)  Result Date: 08/08/2019  Lower Venous DVTStudy Indications: Hypoxia, covid+.  Comparison Study: no prior Performing Technologist: Abram Sander RVS  Examination Guidelines: A complete evaluation includes B-mode imaging, spectral Doppler, color Doppler, and power Doppler as needed of all accessible portions of each vessel. Bilateral testing is considered an integral part of a complete examination. Limited examinations for reoccurring indications may be performed as noted. The reflux portion of the exam is performed with the patient in reverse Trendelenburg.  +---------+---------------+---------+-----------+----------+--------------+ RIGHT    CompressibilityPhasicitySpontaneityPropertiesThrombus Aging +---------+---------------+---------+-----------+----------+--------------+ CFV      Full           Yes      Yes                                  +---------+---------------+---------+-----------+----------+--------------+ SFJ      Full                                                        +---------+---------------+---------+-----------+----------+--------------+ FV Prox  Full                                                        +---------+---------------+---------+-----------+----------+--------------+ FV Mid   Full                                                        +---------+---------------+---------+-----------+----------+--------------+ FV DistalFull                                                        +---------+---------------+---------+-----------+----------+--------------+ PFV      Full                                                        +---------+---------------+---------+-----------+----------+--------------+ POP      Full  Yes      Yes                                 +---------+---------------+---------+-----------+----------+--------------+ PTV      Full                                                        +---------+---------------+---------+-----------+----------+--------------+ PERO     Full                                                        +---------+---------------+---------+-----------+----------+--------------+   +---------+---------------+---------+-----------+----------+--------------+ LEFT     CompressibilityPhasicitySpontaneityPropertiesThrombus Aging +---------+---------------+---------+-----------+----------+--------------+ CFV      Full           Yes      Yes                                 +---------+---------------+---------+-----------+----------+--------------+ SFJ      Full                                                        +---------+---------------+---------+-----------+----------+--------------+ FV Prox  Full                                                         +---------+---------------+---------+-----------+----------+--------------+ FV Mid   Full                                                        +---------+---------------+---------+-----------+----------+--------------+ FV DistalFull                                                        +---------+---------------+---------+-----------+----------+--------------+ PFV      Full                                                        +---------+---------------+---------+-----------+----------+--------------+ POP      Full           Yes      Yes                                 +---------+---------------+---------+-----------+----------+--------------+ PTV  Full                                                        +---------+---------------+---------+-----------+----------+--------------+ PERO     Full                                                        +---------+---------------+---------+-----------+----------+--------------+     Summary: BILATERAL: - No evidence of deep vein thrombosis seen in the lower extremities, bilaterally.   *See table(s) above for measurements and observations. Electronically signed by Curt Jews MD on 08/08/2019 at 8:17:03 AM.    Final

## 2019-08-24 NOTE — Progress Notes (Signed)
Occupational Therapy Evaluation Patient Details Name: Lucas Hobbs MRN: [id number] DOB: March 07, 1956 Today's Date: 08/24/2019    History of Present Illness 64 year old male admitted 08/22/19 for severe hypoglycemia, acute metabolic encephalopathy, and hypoxemia likely due to HCAP. Patient had a recent admission 3/30-4/13 for hypoxia due to COVID PNA and it appears he was discharge home with home PT services (?). This admission, MRI brain 4/19 showed no acute abnormality but did show chronic multifactorial infaracts R MCA. He may also have a component of baseline dementia as wife reports memory issues x several years. Outpatient neurology follow-up recommended. Multifactorial respiratory failure from residual COVID pneumonitis and probable bacterial coinfection. Procalcitonin also mildly elevated. Urine cultures negative. PMH: insulin-dependent DM-2, CKD stage IV, CVA, CAD s/p CABG, HTN    Clinical Impression   PTA, pt has been living with mother and assisting her with daily tasks as needed. Pt was Independent with ADLs and mobility prior to diagnosis of COVID. Pt's wife lives out of state. Presently, pt with decreased strength, endurance, and difficulty following safety cues at times. Pt Min A overall for transfers and mobility using RW, requiring cues for safety and pacing throughout. Pt's O2 stats on RA remained WFL with activity. Pt overall Setup for UB ADLs, but requires Min A for LB ADLs due to unsteadiness on feet. Pt below PLOF, unable to care for self or his mother without assistance at this time. Recommend SNF for short term rehab. Will continue to follow acutely.     Follow Up Recommendations  SNF;Supervision/Assistance - 24 hour(SNF)    Equipment Recommendations  None recommended by OT    Recommendations for Other Services       Precautions / Restrictions Precautions Precautions: Fall Restrictions Weight Bearing Restrictions: No      Mobility Bed Mobility Overal bed mobility:  Needs Assistance Bed Mobility: Supine to Sit     Supine to sit: Supervision(with bedrail)     General bed mobility comments: requires cues for sequencing  Transfers Overall transfer level: Needs assistance Equipment used: Rolling walker (2 wheeled) Transfers: Sit to/from [employer] Sit to Stand: Min assist;+2 safety/equipment Stand pivot transfers: Min assist       General transfer comment: Cues for hand placement, sit<>stand from EOB x 2 trials    Balance Overall balance assessment: Needs assistance Sitting-balance support: Feet supported Sitting balance-Leahy Scale: Good     Standing balance support: Bilateral upper extremity supported Standing balance-Leahy Scale: Poor                             ADL either performed or assessed with clinical judgement   ADL Overall ADL's : Needs assistance/impaired Eating/Feeding: Set up;Sitting   Grooming: Set up;Sitting   Upper Body Bathing: Set up;Sitting   Lower Body Bathing: Minimal assistance;Sit to/from stand;Cueing for safety   Upper Body Dressing : Set up;Sitting   Lower Body Dressing: Minimal assistance;Sit to/from stand;Sitting/lateral leans   Toilet Transfer: Minimal assistance;Stand-pivot;Cueing for safety;Cueing for sequencing   Toileting- Clothing Manipulation and Hygiene: Minimal assistance;Sit to/from stand;Sitting/lateral lean       Functional mobility during ADLs: Minimal assistance;Rolling walker General ADL Comments: Pt requires cues for safe sequencing, pacing as pt tends to walk fast with improper RW mgmt.      Vision         Perception     Praxis      Pertinent Vitals/Pain Pain Assessment: No/denies pain  Hand Dominance Left   Extremity/Trunk Assessment Upper Extremity Assessment Upper Extremity Assessment: Generalized weakness   Lower Extremity Assessment Lower Extremity Assessment: Defer to PT evaluation RLE Deficits / Details: grossly  4/5(Simultaneous filing. User may not have seen previous data.) LLE Deficits / Details: grossly 4/5(Simultaneous filing. User may not have seen previous data.)   Cervical / Trunk Assessment Cervical / Trunk Assessment: Normal   Communication Communication Communication: No difficulties   Cognition Arousal/Alertness: Awake/alert Behavior During Therapy: Flat affect Overall Cognitive Status: History of cognitive impairments - at baseline                                 General Comments: Per chart review, patient's wife reports memory issues x a few years.(Simultaneous filing. User may not have seen previous data.)   General Comments  Patient on room air. Oxygen saturation 95%, RR 19, HR 74 bpm. Oxygen saturation 90% or better with ambulation on room air, HR in 90s bpm.    Exercises     Shoulder Instructions      Home Living Family/patient expects to be discharged to:: Private residence Living Arrangements: Parent((55 year old mother)) Available Help at Discharge: Family((brother)) Type of Home: House Home Access: Stairs to enter [employer] of Steps: 1 Entrance Stairs-Rails: (1 railing unable to state what side) Home Layout: Two level     Bathroom Shower/Tub: Teacher, early years/pre: Standard     Home Equipment: Shower seat;Grab bars - tub/shower;Walker - 2 wheels   Additional Comments: All the bathrooms are on the 2nd floor of the home. Per phone conversation with patient's wife, there is a stairglide to 2nd floor. All the bedrooms and bathrooms are on the 2nd floor. Since COVID illness patient was using a walker that was his late fathers (?) or given to him by late fathers home PT (?). Mother also uses a walker to ambulate. Wife unsure if patient has a RW or [id number].        Prior Functioning/Environment Level of Independence: Independent;Independent with assistive device(s)        Comments: Appears he may have discharged home with RW but  question if it was used (?)(Per wife, prior to COVID infection, he was independent )        OT Problem List: Decreased strength;Decreased activity tolerance;Impaired balance (sitting and/or standing);Decreased safety awareness;Decreased knowledge of use of DME or AE;Decreased knowledge of precautions      OT Treatment/Interventions: Self-care/ADL training;Patient/family education;Therapeutic activities    OT Goals(Current goals can be found in the care plan section) Acute Rehab OT Goals Patient Stated Goal: none stated OT Goal Formulation: With patient Time For Goal Achievement: 09/07/19 Potential to Achieve Goals: Good ADL Goals Pt Will Perform Grooming: with modified independence;standing Pt Will Perform Lower Body Bathing: with modified independence;sitting/lateral leans;sit to/from stand Pt Will Perform Lower Body Dressing: with modified independence;sit to/from stand;sitting/lateral leans Pt Will Transfer to Toilet: with modified independence;ambulating;regular height toilet Pt Will Perform Toileting - Clothing Manipulation and hygiene: with modified independence;sit to/from stand;sitting/lateral leans  OT Frequency: Min 2X/week   Barriers to D/C:            Co-evaluation PT/OT/SLP Co-Evaluation/Treatment: Yes Reason for Co-Treatment: Complexity of the patient's impairments (multi-system involvement);Necessary to address cognition/behavior during functional activity;For patient/therapist safety          AM-PAC OT "6 Clicks" Daily Activity     Outcome Measure Help from another  person eating meals?: None Help from another person taking care of personal grooming?: A Little Help from another person toileting, which includes using toliet, bedpan, or urinal?: A Little Help from another person bathing (including washing, rinsing, drying)?: A Little Help from another person to put on and taking off regular upper body clothing?: A Little Help from another person to put on and  taking off regular lower body clothing?: A Little 6 Click Score: 19   End of Session Equipment Utilized During Treatment: Gait belt;Rolling walker Nurse Communication: Mobility status  Activity Tolerance: Patient tolerated treatment well Patient left: in chair;with call bell/phone within reach;with chair alarm set  OT Visit Diagnosis: Unsteadiness on feet (R26.81);Other abnormalities of gait and mobility (R26.89);Muscle weakness (generalized) (M62.81)                Time: 2712-9290 OT Time Calculation (min): 35 min Charges:  OT General Charges $OT Visit: 1 Visit OT Evaluation $OT Eval Moderate Complexity: 1 Mod  Layla Maw, OTR/L  Layla Maw 08/24/2019, 3:07 PM

## 2019-08-24 NOTE — Progress Notes (Signed)
Physical Therapy Evaluation  Patient presents with impaired balance, decreased safety awareness, and requires cues for sequencing and completion of task. Recommend continued skilled PT services and discharge to SNF for short term rehabilitation. Patient with prior hospitalization and was not thriving at home with his mobility nor with his ability to manage his blood sugar. He also had an ER visit since that time. Per phone discussion with patient's wife during session, with patient's permission, patient had been staying with his mother after his father passed away as he did not want his mother to be alone as she does not drive and has visual impairements. It appears his wife lives out of state. Patient was independent prior to COVID illness, diagnosed March 30th.    08/24/19 1125  PT Visit Information  Last PT Received On 08/24/19  Assistance Needed +1  PT/OT/SLP Co-Evaluation/Treatment Yes  Reason for Co-Treatment Complexity of the patient's impairments (multi-system involvement);Necessary to address cognition/behavior during functional activity;For patient/therapist safety  History of Present Illness 64 year old male admitted 08/22/19 for severe hypoglycemia, acute metabolic encephalopathy, and hypoxemia likely due to HCAP. Patient had a recent admission 3/30-4/13 for hypoxia due to COVID PNA and it appears he was discharge home with home PT services (?). This admission, MRI brain 4/19 showed no acute abnormality but did show chronic multifactorial infaracts R MCA. He may also have a component of baseline dementia as wife reports memory issues x several years. Outpatient neurology follow-up recommended. Multifactorial respiratory failure from residual COVID pneumonitis and probable bacterial coinfection. Procalcitonin also mildly elevated. Urine cultures negative. PMH: insulin-dependent DM-2, CKD stage IV, CVA, CAD s/p CABG, HTN   Precautions  Precautions Fall  Restrictions  Weight Bearing  Restrictions No  Home Living  Family/patient expects to be discharged to: Private residence  Living Arrangements Parent ((75 year old mother))  Available Help at Discharge Family ((brother))  Type of [place] to enter  Entrance Stairs-Number of Steps 1  Entrance Stairs-Rails  (1 railing unable to state what side)  Home Layout Two level  Bathroom Animal nutritionist seat;Grab bars - tub/shower;Walker - 2 wheels  Additional Comments All the bathrooms are on the 2nd floor of the home. Per phone conversation with patient's wife, there is a stairglide to 2nd floor. All the bedrooms and bathrooms are on the 2nd floor. Since COVID illness patient was using a walker that was his late fathers (?) or given to him by late fathers home PT (?). Mother also uses a walker to ambulate. Wife unsure if patient has a RW or [id number].    Prior Function  Level of Independence Independent;Independent with assistive device(s)  Comments Appears he may have discharged home with RW but question if it was used (?) (Per wife, prior to COVID infection, he was independent )  Engineer, petroleum No difficulties  Cognition  Arousal/Alertness Awake/alert  Behavior During Therapy Flat affect  Overall Cognitive Status History of cognitive impairments - at baseline  General Comments Per chart review, patient's wife reports memory issues x a few years.  Lower Extremity Assessment  Lower Extremity Assessment Generalized weakness;RLE deficits/detail;LLE deficits/detail  RLE Deficits / Details grossly 4/5  LLE Deficits / Details grossly 4/5  Bed Mobility  Overal bed mobility Needs Assistance  Bed Mobility Supine to Sit  Supine to sit Supervision (with bedrail)  General bed mobility comments requires cues for sequencing  Transfers  Overall transfer level Needs assistance  Equipment used Rolling walker (2 wheeled)  Transfers Sit to/from  Stand  Sit to [employer] safety/equipment  General transfer comment Cues for hand placement, sit<>stand from EOB x 2 trials  Ambulation/Gait  Ambulation/Gait assistance Min assist;+2 safety/equipment  Gait Distance (Feet) 50 Feet (x2)  Assistive device Rolling walker (2 wheeled)  General Gait Details Cues for standing closer to RW for improved safety and stability. Patient requires cues and assist for obstacle negotiation. Cue to decrease speed to increase safety.  Gait velocity varied  Balance  Overall balance assessment Needs assistance  Sitting-balance support Feet supported  Sitting balance-Leahy Scale Good  Standing balance support Bilateral upper extremity supported  Standing balance-Leahy Scale Poor  General Comments  General comments (skin integrity, edema, etc.) Patient on room air. Oxygen saturation 95%, RR 19, HR 74 bpm. Oxygen saturation 90% or better with ambulation on room air, HR in 90s bpm.  PT - End of Session  Equipment Utilized During Treatment Gait belt  Activity Tolerance Patient tolerated treatment well  Patient left in chair;with call bell/phone within reach;with chair alarm set  Nurse Communication Other (comment) (nurse cleared patient to participate in PT session)  PT Assessment  PT Recommendation/Assessment Patient needs continued PT services  PT Visit Diagnosis Unsteadiness on feet (R26.81);Other abnormalities of gait and mobility (R26.89)  PT Problem List Decreased activity tolerance;Decreased balance;Decreased mobility;Decreased knowledge of use of DME;Decreased safety awareness  Barriers to Discharge Decreased caregiver support  PT Plan  PT Frequency (ACUTE ONLY) Min 2X/week  PT Treatment/Interventions (ACUTE ONLY) DME instruction;Gait training;Functional mobility training;Therapeutic activities;Therapeutic exercise;Stair training;Balance training;Patient/family education  AM-PAC PT "6 Clicks" Mobility Outcome Measure (Version 2)  Help needed  turning from your back to your side while in a flat bed without using bedrails? 3  Help needed moving from lying on your back to sitting on the side of a flat bed without using bedrails? 3  Help needed moving to and from a bed to a chair (including a wheelchair)? 3  Help needed standing up from a chair using your arms (e.g., wheelchair or bedside chair)? 3  Help needed to walk in hospital room? 3  Help needed climbing 3-5 steps with a railing?  2  6 Click Score 17  Consider Recommendation of Discharge To: Home with [facility]  PT Recommendation  Follow Up Recommendations SNF;Supervision/Assistance - 24 hour  PT equipment Rolling walker with 5" wheels  Individuals Consulted  Consulted and Agree with Results and Recommendations Family member/caregiver;Patient (wife via telephone)  Family Member Consulted wife called during session  Acute Rehab PT Goals  PT Goal Formulation With family  Time For Goal Achievement 09/06/19  Potential to Achieve Goals Good  PT Time Calculation  PT Start Time (ACUTE ONLY) 1125  PT Stop Time (ACUTE ONLY) 1205  PT Time Calculation (min) (ACUTE ONLY) 40 min  PT General Charges  $$ ACUTE PT VISIT 1 Visit  PT Evaluation  $PT Eval Moderate Complexity 1 Mod   Birdie Hopes, PT, DPT Acute Rehab [phone] office

## 2019-08-24 NOTE — Progress Notes (Signed)
Inpatient Diabetes Program Recommendations  AACE/ADA: New Consensus Statement on Inpatient Glycemic Control (2015)  Target Ranges:  Prepandial:   less than 140 mg/dL      Peak postprandial:   less than 180 mg/dL (1-2 hours)      Critically ill patients:  140 - 180 mg/dL   Lab Results  Component Value Date   GLUCAP 202 (H) 08/24/2019   HGBA1C 7.5 (H) 08/05/2019    Review of Glycemic Control Results for Lucas Hobbs, Lucas Hobbs (MRN [id number]) as of 08/24/2019 11:22  Ref. Range 08/23/2019 07:56 08/23/2019 11:48 08/23/2019 17:21 08/24/2019 07:33  Glucose-Capillary Latest Ref Range: 70 - 99 mg/dL 159 (H) 214 (H) 228 (H) 202 (H)   Diabetes history: DM 2 Outpatient Diabetes medications: 70/30 38 units qam, 36 units qpm Current orders for Inpatient glycemic control:  Novolog 0-15 units tid  A1c 4.5% on 4/14 BUN/Creat: 37/1.75  Inpatient Diabetes Program Recommendations:    PO prednisone 40 mg Daily started on pt yest. Consider Levemir 5 units.  Thanks,  Tama Headings RN, MSN, BC-ADM Inpatient Diabetes Coordinator Team Pager [phone] (8a-5p)

## 2019-08-24 NOTE — NC FL2 (Signed)
[place] LEVEL OF CARE SCREENING TOOL     IDENTIFICATION  Patient Name: Lucas Hobbs Birthdate: February 06, 1956 Sex: male Admission Date (Current Location): 08/22/2019  [facility] and [place] Number:  Herbalist and Address:  [facility], [place] [address], [place], [place] [address]      Provider Number: [id number]  Attending Physician Name and Address:  Jonetta Osgood, MD  Relative Name and Phone Number:  Stanton Kidney, spouse, [phone]    Current Level of Care: Hospital Recommended Level of Care: [place] Prior Approval Number:    Date Approved/Denied:   PASRR Number: [id number]  Discharge Plan: SNF    Current Diagnoses: Patient Active Problem List   Diagnosis Date Noted  . History of CVA (cerebrovascular accident) 08/22/2019  . Acute respiratory failure with hypoxia ([place]) 08/22/2019  . Complicated urinary tract infection 08/22/2019  . Elevated ALT measurement 08/22/2019  . Acute metabolic encephalopathy 51/88/4166  . Prolonged QT interval 08/22/2019  . CKD (chronic kidney disease), stage III 08/22/2019  . Pressure injury of skin 08/14/2019  . Pneumonia due to COVID-19 virus   . Hypoglycemia due to insulin 08/04/2019  . CKD (chronic kidney disease), stage IV ([place]) 08/04/2019  . Nausea & vomiting 08/04/2019  . Essential hypertension 08/04/2019  . DM (diabetes mellitus), type 2 with renal complications ([place]) 11/03/1599  . CAD (coronary artery disease) 08/04/2019  . COVID-19 08/04/2019  . Diabetic retinopathy ([place])     Orientation RESPIRATION BLADDER Height & Weight     Self, Situation, Place  O2(Nasal cannula 2L) Continent, Indwelling catheter Weight: 155 lb (70.3 kg) Height:  5\' 7"  (170.2 cm)  BEHAVIORAL SYMPTOMS/MOOD NEUROLOGICAL BOWEL NUTRITION STATUS      Continent Diet(Please see DC Summary)  AMBULATORY STATUS COMMUNICATION OF NEEDS Skin   Limited Assist Verbally PU Stage and Appropriate Care(Stage  I on buttocks-foam dressing)                       Personal Care Assistance Level of Assistance  Bathing, Feeding, Dressing Bathing Assistance: Limited assistance Feeding assistance: Independent Dressing Assistance: Limited assistance     Functional Limitations Info  Sight, Hearing, Speech Sight Info: Impaired Hearing Info: Adequate Speech Info: Adequate    SPECIAL CARE FACTORS FREQUENCY  PT (By licensed PT), OT (By licensed OT)     PT Frequency: 5x/week OT Frequency: 5x/week            Contractures Contractures Info: Not present    Additional Factors Info  Code Status, Allergies, Isolation Precautions, Insulin Sliding Scale Code Status Info: Full Allergies Info: Iodine   Insulin Sliding Scale Info: See DC Summary for dose Isolation Precautions Info: COVID+     Current Medications (08/24/2019):  This is the current hospital active medication list Current Facility-Administered Medications  Medication Dose Route Frequency Provider Last Rate Last Admin  . acetaminophen (TYLENOL) tablet 650 mg  650 mg Oral Q6H PRN Norval Morton, MD       Or  . acetaminophen (TYLENOL) suppository 650 mg  650 mg Rectal Q6H PRN Smith, Rondell A, MD      . albuterol (PROVENTIL) (2.5 MG/3ML) 0.083% nebulizer solution 2.5 mg  2.5 mg Nebulization Q4H PRN Smith, Rondell A, MD      . amLODipine (NORVASC) tablet 10 mg  10 mg Oral Daily Tamala Julian, Rondell A, MD   10 mg at 08/24/19 0825  . aspirin chewable tablet 81 mg  81 mg Oral  Daily Fuller Plan A, MD   81 mg at 08/24/19 0824  . atorvastatin (LIPITOR) tablet 40 mg  40 mg Oral QPM Smith, Rondell A, MD   40 mg at 08/23/19 1726  . ceFEPIme (MAXIPIME) 2 g in sodium chloride 0.9 % 100 mL IVPB  2 g Intravenous Q12H Jonetta Osgood, MD 200 mL/hr at 08/24/19 0853 2 g at 08/24/19 0853  . Chlorhexidine Gluconate Cloth 2 % PADS 6 each  6 each Topical Daily Jonetta Osgood, MD   6 each at 08/24/19 [phone]  . doxycycline (VIBRA-TABS) tablet 100 mg   100 mg Oral Q12H Jonetta Osgood, MD   100 mg at 08/24/19 0825  . guaiFENesin (MUCINEX) 12 hr tablet 600 mg  600 mg Oral BID Fuller Plan A, MD   600 mg at 08/24/19 0825  . heparin injection 5,000 Units  5,000 Units Subcutaneous Q8H Skeet Simmer, RPH   5,000 Units at 08/24/19 8850  . insulin aspart (novoLOG) injection 0-15 Units  0-15 Units Subcutaneous TID WC Jonetta Osgood, MD   8 Units at 08/24/19 1214  . insulin glargine (LANTUS) injection 8 Units  8 Units Subcutaneous Daily Ghimire, Shanker M, MD      . metoprolol succinate (TOPROL-XL) 24 hr tablet 50 mg  50 mg Oral QPM Smith, Rondell A, MD   50 mg at 08/23/19 1726  . phenol (CHLORASEPTIC) mouth spray 1 spray  1 spray Mouth/Throat PRN Jonetta Osgood, MD      . Derrill Memo ON 08/25/2019] predniSONE (DELTASONE) tablet 30 mg  30 mg Oral Q breakfast Ghimire, Shanker M, MD      . scopolamine (TRANSDERM-SCOP) 1 MG/3DAYS 1.5 mg  1 patch Transdermal Q72H Smith, Rondell A, MD   1.5 mg at 08/22/19 1223  . sodium chloride flush (NS) 0.9 % injection 3 mL  3 mL Intravenous Q12H Smith, Rondell A, MD   3 mL at 08/24/19 0855  . tamsulosin (FLOMAX) capsule 0.4 mg  0.4 mg Oral Daily Fuller Plan A, MD   0.4 mg at 08/24/19 0825     Discharge Medications: Please see discharge summary for a list of discharge medications.  Relevant Imaging Results:  Relevant Lab Results:   Additional Information SSN: [id number]       COVID positive on 08/04/19 so will be off isolation on 08/26/19  [place], LCSW

## 2019-08-25 LAB — CBC
HCT: 30.9 % — ABNORMAL LOW (ref 39.0–52.0)
Hemoglobin: 10.2 g/dL — ABNORMAL LOW (ref 13.0–17.0)
MCH: 28.8 pg (ref 26.0–34.0)
MCHC: 33 g/dL (ref 30.0–36.0)
MCV: 87.3 fL (ref 80.0–100.0)
Platelets: 174 10*3/uL (ref 150–400)
RBC: 3.54 MIL/uL — ABNORMAL LOW (ref 4.22–5.81)
RDW: 13.2 % (ref 11.5–15.5)
WBC: 8.1 10*3/uL (ref 4.0–10.5)
nRBC: 0 % (ref 0.0–0.2)

## 2019-08-25 LAB — VITAMIN B12: Vitamin B-12: 555 pg/mL (ref 180–914)

## 2019-08-25 LAB — BASIC METABOLIC PANEL
Anion gap: 7 (ref 5–15)
BUN: 41 mg/dL — ABNORMAL HIGH (ref 8–23)
CO2: 25 mmol/L (ref 22–32)
Calcium: 8 mg/dL — ABNORMAL LOW (ref 8.9–10.3)
Chloride: 106 mmol/L (ref 98–111)
Creatinine, Ser: 1.64 mg/dL — ABNORMAL HIGH (ref 0.61–1.24)
GFR calc Af Amer: 51 mL/min — ABNORMAL LOW (ref >60)
GFR calc non Af Amer: 44 mL/min — ABNORMAL LOW (ref >60)
Glucose, Bld: 172 mg/dL — ABNORMAL HIGH (ref 70–99)
Potassium: 3.5 mmol/L (ref 3.5–5.1)
Sodium: 138 mmol/L (ref 135–145)

## 2019-08-25 LAB — GLUCOSE, CAPILLARY
Glucose-Capillary: 155 mg/dL — ABNORMAL HIGH (ref 70–99)
Glucose-Capillary: 196 mg/dL — ABNORMAL HIGH (ref 70–99)
Glucose-Capillary: 237 mg/dL — ABNORMAL HIGH (ref 70–99)
Glucose-Capillary: 188 mg/dL — ABNORMAL HIGH (ref 70–99)

## 2019-08-25 LAB — C-REACTIVE PROTEIN: CRP: 2.4 mg/dL — ABNORMAL HIGH (ref <1.0)

## 2019-08-25 LAB — TSH: TSH: 1.622 u[IU]/mL (ref 0.350–4.500)

## 2019-08-25 LAB — RPR: RPR Ser Ql: NONREACTIVE

## 2019-08-25 MED ORDER — PREDNISONE 20 MG PO TABS
20.0000 mg | ORAL_TABLET | Freq: Every day | ORAL | Status: DC
  Administered 2019-08-26: 20 mg via ORAL
  Filled 2019-08-25: qty 1

## 2019-08-25 MED ORDER — ONDANSETRON HCL 4 MG/2ML IJ SOLN: 4.0000 mg | Freq: Four times a day (QID) | INTRAMUSCULAR | Status: DC | PRN

## 2019-08-25 MED ORDER — FINASTERIDE 5 MG PO TABS
5.0000 mg | ORAL_TABLET | Freq: Every day | ORAL | Status: DC
  Administered 2019-08-25 – 2019-08-26 (×2): 5 mg via ORAL
  Filled 2019-08-25 (×2): qty 1

## 2019-08-25 MED ORDER — TAMSULOSIN HCL 0.4 MG PO CAPS
0.8000 mg | ORAL_CAPSULE | Freq: Every day | ORAL | Status: DC
  Administered 2019-08-25 – 2019-08-26 (×2): 0.8 mg via ORAL
  Filled 2019-08-25 (×2): qty 2

## 2019-08-25 NOTE — TOC Progression Note (Signed)
Transition of Care [facility]) - Progression Note    Patient Details  Name: Jamarrion Budai MRN: [id number] Date of Birth: September 01, 1955  Transition of Care [facility]) CM/SW [place], LCSW Phone Number: 08/25/2019, 1:53 PM  Clinical Narrative:    Patient's spouse has selected [facility]. They will begin insurance authorization process.    Expected Discharge Plan: Skilled Nursing Facility Barriers to Discharge: Insurance Authorization  Expected Discharge Plan and Services Expected Discharge Plan: [place] In-house Referral: Clinical Social Work   Post Acute Care Choice: [place] Living arrangements for the past 2 months: Single Family Home                                       Social Determinants of Health (SDOH) Interventions    Readmission Risk Interventions Readmission Risk Prevention Plan 08/25/2019 08/17/2019  Transportation Screening Complete Complete  PCP or Specialist Appt within 3-5 Days - Complete  HRI or [place] - Complete  Social Work Consult for [place] Planning/Counseling - Complete  Palliative Care Screening - Not Applicable  Medication Review Press photographer) Complete Complete  PCP or Specialist appointment within 3-5 days of discharge Complete -  [place] or Home Care Consult Complete -  SW Recovery Care/Counseling Consult Complete -  Palliative Care Screening Not Applicable -  [place] Complete -

## 2019-08-25 NOTE — Progress Notes (Signed)
PROGRESS NOTE                                                                                                                                                                                                             Patient Demographics:    Lucas Hobbs, is a 64 y.o. male, DOB - 12-17-1955, LSL:[id number]  Outpatient Primary MD for the patient is Nelwyn Salisbury, Hershal Coria   Admit date - 08/22/2019   LOS - 3  Chief Complaint  Patient presents with  . Altered Mental Status  . Hypoglycemia       Brief Narrative: Patient is a 64 y.o. male with PMHx of insulin-dependent DM-2, CKD stage IV, CVA, CAD s/p CABG, HTN who was hospitalized from 3/30-4/13 due to hypoxia from Covid 19 pneumonia, he returned to the emergency room due to severe hypoglycemia, acute metabolic encephalopathy and was found to be hypoxemic likely secondary to HCAP.  See below for further details.  Significant Events: 4/18>> admit to [facility] for hypoglycemia, metabolic encephalopathy and hypoxemia 4/18>> MRI brain: No acute infarct 3/30-4/13 admit for hypoxia from Covid 19 pneumonia.  COVID-19 medications: Steroids: 3/31>> 4/9 Remdesivir: 3/31>> 4/4  Antibiotics: Cefepime: 4/19>> Doxycycline: 4/18>> Ceftriaxone: 4/18 x 1  Microbiology data: 4/18: Blood culture>> no growth 4/18: Urine culture>> no growth  DVT prophylaxis: IV heparin>> SQ Lovenox  Procedures: None  Consults: None    Subjective:   No major events overnight-relatively awake and alert-answers most of my questions appropriately.  Titrated off oxygen this morning.  No episodes of hypoglycemia.   Assessment  & Plan :   Hypoglycemia: Secondary to excessive insulin administration-his dosage of insulin was decreased during his most recent hospitalization.  No further hypoglycemic episodes.  Acute metabolic encephalopathy: Suspect secondary to above-MRI brain on 4/19-no acute  intracranial abnormality-chronic multifocal infarcts in the right MCA.  Per patient spouse-patient has had memory issues for the past several years-suspect he may have some amount of undiagnosed dementia at baseline.  Acute hypoxemic respiratory failure: Resolved-titrated to room air this morning.  Multifactorial etiology-suspect he has some residual Covid pneumonitis-probable bacterial coinfection as well as procalcitonin mildly elevated.  Continue cefepime/doxycycline-since CRP elevated-remains on tapering steroids.  No longer on IV heparin has CT chest negative for PE.  ?Complicated UTI: Does not have UTI-urine cultures negative.  On empiric antibiotics for possible bacterial  pneumonia.  Minimally elevated troponin: Suspect from underlying CKD/demand ischemia from acute illness-trend is flat-not consistent with ACS.-He does have a history of CAD and is s/p CABG.  History of acute urinary retention during his most recent hospitalization Foley catheter in place: Remains on Flomax-attempted voiding trial on 4/20-developed acute urinary retention-Foley catheter was reinserted.  Continue Flomax but change to 0.8 mg daily-add finasteride.  Will need outpatient urology follow-up for voiding trial and further evaluation.  HTN: BP controlled-continue amlodipine and metoprolol  CAD s/p CABG: No anginal symptoms-continue aspirin, statin, metoprolol  History of CVA seen on imaging studies: Nonfocal exam-MRI with no acute CVA-continue aspirin/statin.  Per patient spouse-she is not aware of a prior diagnosis of CVA for which the patient was admitted to the hospital with.  Could have been asymptomatic  QTC prolongation: Resolved-after correction of hypomagnesemia and hypokalemia.    Avoid QTC prolonging agents  Nausea/vomiting: Resolved-we will discontinue scopolamine patch-and transition to as needed Zofran noted QTC has improved.    DM-2 (A1c 7.5 on 08/05/2019): No further hypoglycemic episodes-CBGs on the  higher side as patient on low-dose steroids-however CBGs relatively stable with 8 units of Lantus and SSI.  Follow closely-steroids being titrated down.  CBG (last 3)  Recent Labs    08/24/19 1611 08/24/19 2140 08/25/19 0745  GLUCAP 203* 243* 155*    CKD stage IV: Creatinine close to baseline-monitor periodically-avoid nephrotoxic agents.  ?Dementia/cognitive dysfunction: Spouse reports several years of memory issues/confusion-probably has some amount of lingering encephalopathy from recent Covid infection hypoxemia/hypoglycemia.  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  Have asked spouse to make appointment with neurology in the outpatient setting when he has recovered from his acute illness.   TSH, vitamin B12 within normal limits.  Awaiting RPR.  Patient is improved and currently back to baseline.  Hx of seminoma s/p Left orchiectomy  ABG: No results found for: PHART, PCO2ART, PO2ART, HCO3, TCO2, ACIDBASEDEF, O2SAT  Vent Settings: N/A    Condition - Stable  Family Communication  :  Spouse updated over the phone 4/21  Code Status :  Full Code  Diet :  Diet Order            Diet heart healthy/carb modified Room service appropriate? Yes; Fluid consistency: Thin  Diet effective now               Disposition Plan  :  Remain hospitalized-SNF on discharge  Barriers to discharge: Hypoxia requiring O2 supplementation/on IV antibiotics  Antimicorbials  :    Anti-infectives (From admission, onward)   Start     Dose/Rate Route Frequency Ordered Stop   08/23/19 2200  doxycycline (VIBRA-TABS) tablet 100 mg     100 mg Oral Every 12 hours 08/23/19 1219 08/26/19 2159   08/23/19 0800  ceFEPIme (MAXIPIME) 2 g in sodium chloride 0.9 % 100 mL IVPB     2 g 200 mL/hr over 30 Minutes Intravenous Every 12 hours 08/23/19 0757 08/28/19 0759   08/22/19  1145  doxycycline (VIBRAMYCIN) 100 mg in sodium chloride 0.9 % 250 mL IVPB  Status:  Discontinued     100 mg 125 mL/hr over 120 Minutes Intravenous Every 12 hours 08/22/19 1144 08/23/19 1219   08/22/19 1000  cefTRIAXone (ROCEPHIN) 2 g in sodium chloride 0.9 % 100 mL IVPB  Status:  Discontinued     2 g 200 mL/hr over 30 Minutes  Intravenous Every 24 hours 08/22/19 0811 08/23/19 0724      Inpatient Medications  Scheduled Meds: . amLODipine  10 mg Oral Daily  . aspirin  81 mg Oral Daily  . atorvastatin  40 mg Oral QPM  . Chlorhexidine Gluconate Cloth  6 each Topical Daily  . doxycycline  100 mg Oral Q12H  . finasteride  5 mg Oral Daily  . guaiFENesin  600 mg Oral BID  . heparin injection (subcutaneous)  5,000 Units Subcutaneous Q8H  . insulin aspart  0-15 Units Subcutaneous TID WC  . insulin glargine  8 Units Subcutaneous Daily  . metoprolol succinate  50 mg Oral QPM  . predniSONE  30 mg Oral Q breakfast  . scopolamine  1 patch Transdermal Q72H  . sodium chloride flush  3 mL Intravenous Q12H  . tamsulosin  0.8 mg Oral Daily   Continuous Infusions: . ceFEPime (MAXIPIME) IV 2 g (08/25/19 0807)   PRN Meds:.acetaminophen **OR** acetaminophen, albuterol, phenol   Time Spent in minutes  35    See all Orders from today for further details   Oren Binet M.D on 08/25/2019 at 11:21 AM  To page go to [email or web address] - use universal password  [facility] -  Office  [phone]    Objective:   Vitals:   08/25/19 0000 08/25/19 0400 08/25/19 0750 08/25/19 1105  BP: 126/65  (!) 160/71 130/75  Pulse: (!) 56  61 63  Resp: 14 16 20 18   Temp: 98.2 F (36.8 C) (!) 97.5 F (36.4 C) 97.9 F (36.6 C) 98 F (36.7 C)  TempSrc: Oral Oral Oral Oral  SpO2: 93%  95% 94%  Weight:      Height:        Wt Readings from Last 3 Encounters:  08/22/19 70.3 kg  08/18/19 70.3 kg  08/17/19 70.3 kg     Intake/Output Summary (Last 24 hours) at 08/25/2019 1121 Last data filed at  08/25/2019 1059 Gross per 24 hour  Intake 1080 ml  Output 1425 ml  Net -345 ml     Physical Exam Gen Exam:Alert awake-not in any distress HEENT:atraumatic, normocephalic Chest: B/L clear to auscultation anteriorly CVS:S1S2 regular Abdomen:soft non tender, non distended Extremities:no edema Neurology: Non focal Skin: no rash   Data Review:    CBC Recent Labs  Lab 08/18/19 1219 08/22/19 0302 08/23/19 0235 08/24/19 0315 08/25/19 0629  WBC 20.8* 14.9* 10.1 7.2 8.1  HGB 13.2 12.5* 9.4* 10.3* 10.2*  HCT 40.3 39.8 28.8* 31.4* 30.9*  PLT 157 207 163 178 174  MCV 87.6 89.6 88.6 88.0 87.3  MCH 28.7 28.2 28.9 28.9 28.8  MCHC 32.8 31.4 32.6 32.8 33.0  RDW 13.4 13.9 13.8 13.2 13.2  LYMPHSABS 0.4* 0.3*  --   --   --   MONOABS 1.1* 0.7  --   --   --   EOSABS 0.0 0.0  --   --   --   BASOSABS 0.0 0.0  --   --   --     Chemistries  Recent Labs  Lab 08/18/19 1219 08/22/19 0302 08/23/19 0235 08/24/19 0315 08/25/19 0629  NA 143 146* 140 138 138  K 4.1 3.9 3.6 4.3 3.5  CL 105 109 105 104 106  CO2 23 27 25 24 25   GLUCOSE 230* 84 226* 260* 172*  BUN 35* 31* 33* 37* 41*  CREATININE 1.91* 1.64* 1.83* 1.75* 1.64*  CALCIUM 8.7* 8.3* 7.5* 7.8* 8.0*  MG  --   --   --  2.0  --   AST 54* 39  --  21  --   ALT 61* 46*  --  29  --   ALKPHOS 57 58  --  46  --   BILITOT 1.2 1.1  --  0.6  --    ------------------------------------------------------------------------------------------------------------------ No results for input(s): CHOL, HDL, LDLCALC, TRIG, CHOLHDL, LDLDIRECT in the last 72 hours.  Lab Results  Component Value Date   HGBA1C 7.5 (H) 08/05/2019   ------------------------------------------------------------------------------------------------------------------ Recent Labs    08/25/19 0629  TSH 1.622   ------------------------------------------------------------------------------------------------------------------ Recent Labs    08/25/19 0629  VITAMINB12 555      Coagulation profile Recent Labs  Lab 08/22/19 0302  INR 1.3*    No results for input(s): DDIMER in the last 72 hours.  Cardiac Enzymes No results for input(s): CKMB, TROPONINI, MYOGLOBIN in the last 168 hours.  Invalid input(s): CK ------------------------------------------------------------------------------------------------------------------    Component Value Date/Time   BNP 178.9 (H) 08/23/2019 5277    Micro Results Recent Results (from the past 240 hour(s))  Blood Culture (routine x 2)     Status: None (Preliminary result)   Collection Time: 08/22/19  3:46 AM   Specimen: BLOOD RIGHT ARM  Result Value Ref Range Status   Specimen Description BLOOD RIGHT ARM  Final   Special Requests   Final    BOTTLES DRAWN AEROBIC AND ANAEROBIC Blood Culture adequate volume   Culture   Final    NO GROWTH 2 DAYS Performed at [facility], [address] [place], [place] [address]    Report Status PENDING  Incomplete  Urine culture     Status: None   Collection Time: 08/22/19  5:29 AM   Specimen: In/Out Cath Urine  Result Value Ref Range Status   Specimen Description IN/OUT CATH URINE  Final   Special Requests NONE  Final   Culture   Final    NO GROWTH Performed at [facility], [place] [address], [place], [place] [address]    Report Status 08/23/2019 FINAL  Final  Blood Culture (routine x 2)     Status: None (Preliminary result)   Collection Time: 08/22/19  7:10 AM   Specimen: BLOOD LEFT WRIST  Result Value Ref Range Status   Specimen Description BLOOD LEFT WRIST  Final   Special Requests   Final    BOTTLES DRAWN AEROBIC AND ANAEROBIC Blood Culture results may not be optimal due to an inadequate volume of blood received in culture bottles   Culture   Final    NO GROWTH 2 DAYS Performed at [facility], [place] [address] [place], [place] [address]    Report Status PENDING  Incomplete    Radiology Reports DG Chest 1 View  Result Date:  08/11/2019 CLINICAL DATA:  COVID-19 positive, hypoxemia. EXAM: CHEST  1 VIEW COMPARISON:  August 07, 2019. FINDINGS: Stable cardiomediastinal silhouette. Sternotomy wires are noted. No pneumothorax or pleural effusion is noted. Bilateral lung opacities are decreased compared to prior exam suggesting improving multifocal pneumonia. Bony thorax is unremarkable. IMPRESSION: Improving bilateral lung opacities are noted suggesting improving multifocal pneumonia. Electronically Signed   By: Marijo Conception M.D.   On: 08/11/2019 15:57   DG Chest 2 View  Result Date: 08/03/2019 CLINICAL DATA:  Vomiting, short of breath, hiccups EXAM: CHEST - 2 VIEW COMPARISON:  None. FINDINGS: Frontal and lateral views of the chest demonstrate an unremarkable cardiac silhouette. Postsurgical changes are seen from bypass surgery. No airspace disease,  effusion, or pneumothorax. No acute bony abnormalities. IMPRESSION: 1. No acute intrathoracic process. Electronically Signed   By: Randa Ngo M.D.   On: 08/03/2019 23:22   DG Abd 1 View  Result Date: 08/14/2019 CLINICAL DATA:  Nausea vomiting EXAM: ABDOMEN - 1 VIEW COMPARISON:  Abdominal film 08/09/2019 FINDINGS: Post median sternotomy. Lung bases with scattered airspace disease similar to prior chest x-ray for abdominal series. No signs of bowel obstruction. Stool and gas throughout the colon. Stool and rectal gas. No acute bone finding. No signs of abnormal calcification. IMPRESSION: Scattered basilar airspace disease better assessed on previous dedicated chest radiography. No signs of bowel obstruction. Electronically Signed   By: Zetta Bills M.D.   On: 08/14/2019 15:34   CT HEAD WO CONTRAST  Result Date: 08/06/2019 CLINICAL DATA:  Delirium EXAM: CT HEAD WITHOUT CONTRAST TECHNIQUE: Contiguous axial images were obtained from the base of the skull through the vertex without intravenous contrast. COMPARISON:  None. FINDINGS: Brain: No acute hemorrhage, acute infarction or  space-occupying mass lesion is seen. Multifocal infarcts are noted on the right with encephalomalacia predominately within the distribution of the right middle cerebral artery. Vascular: No hyperdense vessel or unexpected calcification. Skull: Normal. Negative for fracture or focal lesion. Sinuses/Orbits: Mucosal retention cyst is noted in the left maxillary antrum. 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 No acute abnormality noted. Electronically Signed   By: Inez Catalina M.D.   On: 08/06/2019 20:16   CT Angio Chest PE W and/or Wo Contrast  Result Date: 08/22/2019 CLINICAL DATA:  Hypoxemia EXAM: CT ANGIOGRAPHY CHEST WITH CONTRAST TECHNIQUE: Multidetector CT imaging of the chest was performed using the standard protocol during bolus administration of intravenous contrast. Multiplanar CT image reconstructions and MIPs were obtained to evaluate the vascular anatomy. CONTRAST:  69mL OMNIPAQUE IOHEXOL 350 MG/ML SOLN COMPARISON:  None. FINDINGS: Cardiovascular: There is no pulmonary embolism identified within the main, lobar or segmental pulmonary arteries bilaterally. Aortic atherosclerosis. No thoracic aortic aneurysm. No pericardial effusion. Three-vessel coronary artery atherosclerosis. Median sternotomy for presumed CABG. Mediastinum/Nodes: No mass or enlarged lymph nodes within the mediastinum or perihilar regions. Esophagus appears normal. Trachea appears normal. Lungs/Pleura: Patchy nodular consolidations and ground-glass opacities throughout both lungs, most prominent at the lung bases, consistent with multifocal pneumonia. No pleural effusion or pneumothorax. Upper Abdomen: Limited images of the upper abdomen are unremarkable. Musculoskeletal: Median sternotomy wires in place. No acute or suspicious osseous finding. Review of the MIP images confirms the above findings. IMPRESSION: 1. Patchy nodular consolidations and  ground-glass opacities throughout both lungs, most prominent at the lung bases, most likely multifocal pneumonia. COVID 19 pneumonia can have this appearance. Differential includes atypical pneumonias such as viral or fungal, interstitial pneumonias, edema related to volume overload/CHF, chronic interstitial diseases, hypersensitivity pneumonitis, and respiratory bronchiolitis. 2. No pulmonary embolism seen. Aortic Atherosclerosis (ICD10-I70.0). Electronically Signed   By: Franki Cabot M.D.   On: 08/22/2019 14:24   MR BRAIN WO CONTRAST  Result Date: 08/23/2019 CLINICAL DATA:  64 year old male with subacute neurologic deficits. Delirium earlier this month. EXAM: MRI HEAD WITHOUT CONTRAST TECHNIQUE: Multiplanar, multiecho pulse sequences of the brain and surrounding structures were obtained without intravenous contrast. COMPARISON:  Head CT 08/06/2019. FINDINGS: Brain: Multifocal right hemisphere developing encephalomalacia with areas of laminar necrosis and hemosiderin. These are within the right MCA territory and correspond to the abnormal hypodensity on CT earlier this month. None of these areas is definitely restricted on  diffusion. No restricted diffusion to suggest acute infarction. No midline shift, mass effect, evidence of mass lesion, ventriculomegaly, extra-axial collection or acute intracranial hemorrhage. Cervicomedullary junction and pituitary are within normal limits. Outside of the right MCA territory there is patchy bilateral cerebral white matter T2 and FLAIR hyperintensity, and occasional chronic micro hemorrhages (posterior left centrum semiovale series 14, image 40). But no other cortical encephalomalacia. However, there is evidence of chronic lacunar infarcts in the right basal ganglia and thalamus. Brainstem and cerebellum appear negative. Vascular: Major intracranial vascular flow voids are preserved, the left vertebral artery appears dominant. Skull and upper cervical spine: Negative  visible cervical spine and bone marrow signal. Sinuses/Orbits: Negative orbits. Trace paranasal sinus mucosal thickening. Other: Trace bilateral mastoid air cell fluid, probably postinflammatory with negative visible nasopharynx. Other visible internal auditory structures appear normal. Negative scalp and face soft tissues. IMPRESSION: 1. No acute intracranial abnormality. 2. 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 Associated laminar necrosis, hemosiderin, and developing encephalomalacia. 3. Superimposed chronic small vessel disease in the right deep gray nuclei and bilateral white matter with occasional other chronic microhemorrhages. Electronically Signed   By: Genevie Ann M.D.   On: 08/23/2019 05:23   US Abdomen Complete  Result Date: 08/04/2019 CLINICAL DATA:  Nausea and vomiting for 7 days EXAM: ABDOMEN ULTRASOUND COMPLETE COMPARISON:  CT from earlier today FINDINGS: Gallbladder: Cholelithiasis. No wall thickening or focal tenderness. Common bile duct: Diameter: 4 mm Liver: No focal lesion identified. Within normal limits in parenchymal echogenicity. Portal vein is patent on color Doppler imaging with normal direction of blood flow towards the liver. IVC: No abnormality visualized. Pancreas: Obscured. Spleen: Size and appearance within normal limits. Right Kidney: Length: 12 cm. Right lower pole cyst measuring 19 mm, correlating with hemorrhagic cyst on prior CT. A thin benign-appearing septation is present. Left Kidney: Length: 11.6 cm. Echogenicity within normal limits. No mass or hydronephrosis visualized. Abdominal aorta: No aneurysm visualized.  Atherosclerosis IMPRESSION: 1. No additional finding when compared to CT earlier the same day. 2. Cholelithiasis. Electronically Signed   By: Monte Fantasia M.D.   On: 08/04/2019 06:38   US RENAL  Result Date: 08/10/2019 CLINICAL DATA:  Acute renal failure EXAM: RENAL / URINARY TRACT  ULTRASOUND COMPLETE COMPARISON:  Ultrasound abdomen 08/04/2019 FINDINGS: Right Kidney: Renal measurements: 10.2 x 5.2 x 6.8 cm = volume: 190 mL. Negative for renal obstruction. Several small renal cysts are present largest measuring 1.5 cm. Normal cortical echogenicity. Left Kidney: Renal measurements: 9.8 x 4.9 x 4.8 cm = volume: 121 mL. Echogenicity within normal limits. No mass or hydronephrosis visualized. Bladder: Normal bladder. The bladder is full with a Foley catheter inflated in the bladder. Other: None. IMPRESSION: No renal obstruction.  Small right renal cyst noted. Bladder is full with a Foley catheter in place. Electronically Signed   By: Franchot Gallo M.D.   On: 08/10/2019 14:56   DG Chest Port 1 View  Result Date: 08/22/2019 CLINICAL DATA:  Unresponsive, tachycardia, hyperglycemia EXAM: PORTABLE CHEST 1 VIEW COMPARISON:  08/11/2019 FINDINGS: 2 frontal views of the chest demonstrate a stable cardiac silhouette. Postsurgical changes from bypass surgery. There are persistent but improving bibasilar airspace opacities, left greater than right. No effusion or pneumothorax. No acute bony abnormalities. IMPRESSION: 1. Persistent but improving bibasilar airspace disease, consistent with multifocal pneumonia. Electronically Signed   By: Randa Ngo M.D.   On: 08/22/2019 03:57   DG CHEST PORT 1  VIEW  Result Date: 08/07/2019 CLINICAL DATA:  Hypoxia EXAM: PORTABLE CHEST 1 VIEW COMPARISON:  August 04, 2019 FINDINGS: There is new airspace consolidation in the left mid and lower lung zones as well as in the right upper lobe and right base. Heart is upper normal in size with pulmonary vascularity normal. Patient is status post coronary artery bypass grafting. No adenopathy. No bone lesions. IMPRESSION: Multifocal airspace opacity, new from prior study. Appearance consistent with multifocal pneumonia. Stable cardiac silhouette. Electronically Signed   By: Lowella Grip III M.D.   On: 08/07/2019 08:40    DG Abd Portable 2V  Result Date: 08/09/2019 CLINICAL DATA:  COVID-19 positive. EXAM: PORTABLE ABDOMEN - 2 VIEW COMPARISON:  None. FINDINGS: The bowel gas pattern is normal. There is no evidence of free air. No radio-opaque calculi or other significant radiographic abnormality is seen. IMPRESSION: No evidence of bowel obstruction or ileus. Electronically Signed   By: Marijo Conception M.D.   On: 08/09/2019 12:08   ECHOCARDIOGRAM COMPLETE  Result Date: 08/04/2019    ECHOCARDIOGRAM REPORT   Patient Name:   KALED ALLENDE Date of Exam: 08/04/2019 Medical Rec #:  [id number]   Height:       67.0 in Accession #:    [id number]  Weight:       187.4 lb Date of Birth:  1955/06/13  BSA:          1.967 m Patient Age:    71 years    BP:           166/71 mmHg Patient Gender: M           HR:           75 bpm. Exam Location:  Inpatient Procedure: 2D Echo Indications:    Elevated Troponin  History:        Patient has no prior history of Echocardiogram examinations.                 Risk Factors:Diabetes and Hypertension. CKD                 Covid19 infection.  Sonographer:    Vikki Ports Turrentine Referring Phys: [place]  1. Left ventricular ejection fraction, by estimation, is 55 to 60%. The left ventricle has normal function. The left ventricle has no regional wall motion abnormalities. There is moderate concentric left ventricular hypertrophy. Left ventricular diastolic parameters are indeterminate.  2. Right ventricular systolic function is normal. The right ventricular size is normal.  3. The mitral valve is normal in structure. Trivial mitral valve regurgitation. No evidence of mitral stenosis.  4. The aortic valve has an indeterminant number of cusps. Aortic valve regurgitation is not visualized. No aortic stenosis is present.  5. The inferior vena cava is normal in size with greater than 50% respiratory variability, suggesting right atrial pressure of 3 mmHg. Comparison(s): No prior Echocardiogram.  FINDINGS  Left Ventricle: Left ventricular ejection fraction, by estimation, is 55 to 60%. The left ventricle has normal function. The left ventricle has no regional wall motion abnormalities. The left ventricular internal cavity size was normal in size. There is  moderate concentric left ventricular hypertrophy. Left ventricular diastolic parameters are indeterminate. Right Ventricle: The right ventricular size is normal. No increase in right ventricular wall thickness. Right ventricular systolic function is normal. Left Atrium: Left atrial size was normal in size. Right Atrium: Right atrial size was normal in size. Pericardium: There is no evidence of pericardial effusion. Mitral  Valve: The mitral valve is normal in structure. Trivial mitral valve regurgitation. No evidence of mitral valve stenosis. Tricuspid Valve: The tricuspid valve is normal in structure. Tricuspid valve regurgitation is trivial. No evidence of tricuspid stenosis. Aortic Valve: The aortic valve has an indeterminant number of cusps. Aortic valve regurgitation is not visualized. No aortic stenosis is present. There is mild calcification of the aortic valve. Pulmonic Valve: The pulmonic valve was not well visualized. Pulmonic valve regurgitation is not visualized. No evidence of pulmonic stenosis. Aorta: The aortic root, ascending aorta and aortic arch are all structurally normal, with no evidence of dilitation or obstruction. Pulmonary Artery: The pulmonary artery is not well seen. Venous: The inferior vena cava is normal in size with greater than 50% respiratory variability, suggesting right atrial pressure of 3 mmHg. IAS/Shunts: No atrial level shunt detected by color flow Doppler.  LEFT VENTRICLE PLAX 2D LVIDd:         4.78 cm  Diastology LVIDs:         3.10 cm  LV e' lateral:   7.72 cm/s LV PW:         1.51 cm  LV E/e' lateral: 10.5 LV IVS:        1.76 cm  LV e' medial:    5.00 cm/s LVOT diam:     1.80 cm  LV E/e' medial:  16.2 LV SV:          50 LV SV Index:   25 LVOT Area:     2.54 cm  RIGHT VENTRICLE RV S prime:     15.10 cm/s TAPSE (M-mode): 1.8 cm LEFT ATRIUM             Index       RIGHT ATRIUM           Index LA diam:        2.80 cm 1.42 cm/m  RA Area:     12.80 cm LA Vol (A2C):   51.5 ml 26.18 ml/m RA Volume:   26.90 ml  13.68 ml/m LA Vol (A4C):   30.3 ml 15.40 ml/m LA Biplane Vol: 39.9 ml 20.28 ml/m  AORTIC VALVE LVOT Vmax:   111.00 cm/s LVOT Vmean:  78.600 cm/s LVOT VTI:    0.196 m  AORTA Ao Root diam: 3.40 cm MITRAL VALVE MV Area (PHT): 4.36 cm    SHUNTS MV Decel Time: 174 msec    Systemic VTI:  0.20 m MV E velocity: 81.00 cm/s  Systemic Diam: 1.80 cm MV A velocity: 92.40 cm/s MV E/A ratio:  0.88 Buford Dresser MD Electronically signed by Buford Dresser MD Signature Date/Time: 08/04/2019/6:42:54 PM    Final    CT RENAL STONE STUDY  Result Date: 08/04/2019 CLINICAL DATA:  Flank pain with kidney stone suspected. EXAM: CT ABDOMEN AND PELVIS WITHOUT CONTRAST TECHNIQUE: Multidetector CT imaging of the abdomen and pelvis was performed following the standard protocol without IV contrast. COMPARISON:  12/07/2018 FINDINGS: Lower chest: Patchy airspace disease in the lower lungs. Coronary atherosclerosis. Hepatobiliary: No focal liver abnormality.Minimal layering high-density in the gallbladder consistent with calculi. No wall thickening or inflammatory changes. Pancreas: Unremarkable. Spleen: Unremarkable. Adrenals/Urinary Tract: Negative adrenals. No hydronephrosis or stone. Hyperdense mass at the lower pole right kidney that is stable at 19 mm, consistent with hemorrhagic cyst given density. There is thinning of the cortex at the upper pole on the left where there is a subtle cystic density. Minimal layering calcification in the urinary bladder towards the left, not mural  calcification based on prior. Stomach/Bowel:  No obstruction. No appendicitis. Vascular/Lymphatic: No acute vascular abnormality. Diffuse atherosclerotic  calcification of the aorta and branch vessels. No mass or adenopathy. Reproductive:Negative Other: No ascites or pneumoperitoneum. Musculoskeletal: Spondylosis without acute finding These results will be called to the ordering clinician or representative by the Radiologist Assistant, and communication documented in the PACS or [employer]. IMPRESSION: 1. Patchy airspace disease in the lower lungs with inflammatory appearance. Please correlate with COVID-19 status. 2. No acute intra-abdominal finding. 3. Small volume layering calculi in the gallbladder and urinary bladder. 4.  Aortic Atherosclerosis (ICD10-I70.0).  Coronary atherosclerosis Electronically Signed   By: Monte Fantasia M.D.   On: 08/04/2019 05:11   VAS [place] LOWER EXTREMITY VENOUS (DVT)  Result Date: 08/08/2019  Lower Venous DVTStudy Indications: Hypoxia, covid+.  Comparison Study: no prior Performing Technologist: Abram Sander RVS  Examination Guidelines: A complete evaluation includes B-mode imaging, spectral Doppler, color Doppler, and power Doppler as needed of all accessible portions of each vessel. Bilateral testing is considered an integral part of a complete examination. Limited examinations for reoccurring indications may be performed as noted. The reflux portion of the exam is performed with the patient in reverse Trendelenburg.  +---------+---------------+---------+-----------+----------+--------------+ RIGHT    CompressibilityPhasicitySpontaneityPropertiesThrombus Aging +---------+---------------+---------+-----------+----------+--------------+ CFV      Full           Yes      Yes                                 +---------+---------------+---------+-----------+----------+--------------+ SFJ      Full                                                        +---------+---------------+---------+-----------+----------+--------------+ FV Prox  Full                                                         +---------+---------------+---------+-----------+----------+--------------+ FV Mid   Full                                                        +---------+---------------+---------+-----------+----------+--------------+ FV DistalFull                                                        +---------+---------------+---------+-----------+----------+--------------+ PFV      Full                                                        +---------+---------------+---------+-----------+----------+--------------+ POP      Full           Yes  Yes                                 +---------+---------------+---------+-----------+----------+--------------+ PTV      Full                                                        +---------+---------------+---------+-----------+----------+--------------+ PERO     Full                                                        +---------+---------------+---------+-----------+----------+--------------+   +---------+---------------+---------+-----------+----------+--------------+ LEFT     CompressibilityPhasicitySpontaneityPropertiesThrombus Aging +---------+---------------+---------+-----------+----------+--------------+ CFV      Full           Yes      Yes                                 +---------+---------------+---------+-----------+----------+--------------+ SFJ      Full                                                        +---------+---------------+---------+-----------+----------+--------------+ FV Prox  Full                                                        +---------+---------------+---------+-----------+----------+--------------+ FV Mid   Full                                                        +---------+---------------+---------+-----------+----------+--------------+ FV DistalFull                                                         +---------+---------------+---------+-----------+----------+--------------+ PFV      Full                                                        +---------+---------------+---------+-----------+----------+--------------+ POP      Full           Yes      Yes                                 +---------+---------------+---------+-----------+----------+--------------+ PTV      Full                                                        +---------+---------------+---------+-----------+----------+--------------+  PERO     Full                                                        +---------+---------------+---------+-----------+----------+--------------+     Summary: BILATERAL: - No evidence of deep vein thrombosis seen in the lower extremities, bilaterally.   *See table(s) above for measurements and observations. Electronically signed by Curt Jews MD on 08/08/2019 at 8:17:03 AM.    Final

## 2019-08-25 NOTE — TOC Initial Note (Signed)
Transition of Care [facility]) - Initial/Assessment Note    Patient Details  Name: Lucas Hobbs MRN: [id number] Date of Birth: 06/15/1955  Transition of Care [facility]) CM/SW Contact:    Benard Halsted, LCSW Phone Number: 08/25/2019, 10:27 AM  Clinical Narrative:                 CSW received consult for possible SNF placement at time of discharge. CSW spoke with patient's spouse regarding PT recommendation of SNF placement at time of discharge. Patient's spouse reported that patient moved in with his mother after his father passed away to assist her and wife is currently living in [place]. Patient's mother is currently unable to care for patient at their home given patient's current physical needs and fall risk. Patient's spouse expressed understanding of PT recommendation and is agreeable to SNF placement at time of discharge. She plans on flying patient to [place] as soon as he is able to travel. CSW discussed insurance authorization process and provided Medicare SNF list. Patient's spouse expressed being hopeful for patient to rehab and to feel better soon. She will contact CSW back with SNF decision so insurance authorization can be started. She requested assistance with HCPOA paperwork and CSW explained [facility] could be assist with that if they are able to arrange it in an isolation room. No further questions reported at this time. CSW to continue to follow and assist with discharge planning needs.   Expected Discharge Plan: Skilled Nursing Facility Barriers to Discharge: Insurance Authorization   Patient Goals and CMS Choice Patient states their goals for this hospitalization and ongoing recovery are:: Get stronger CMS Medicare.gov Compare Post Acute Care list provided to:: Patient Represenative (must comment)(Spouse) Choice offered to / list presented to : Spouse  Expected Discharge Plan and Services Expected Discharge Plan: [place] In-house Referral: Clinical Social Work    Post Acute Care Choice: [place] Living arrangements for the past 2 months: [place]                                      Prior Living Arrangements/Services Living arrangements for the past 2 months: Single Family Home Lives with:: Parents(Mother) Patient language and need for interpreter reviewed:: Yes Do you feel safe going back to the place where you live?: Yes      Need for Family Participation in Patient Care: Yes (Comment) Care giver support system in place?: Yes (comment)   Criminal Activity/Legal Involvement Pertinent to Current Situation/Hospitalization: No - Comment as needed  Activities of Daily Living      Permission Sought/Granted Permission sought to share information with : Facility Sport and exercise psychologist, Family Supports Permission granted to share information with : Yes, Verbal Permission Granted  Share Information with NAME: Stanton Kidney  Permission granted to share info w AGENCY: SNFs  Permission granted to share info w Relationship: Spouse  Permission granted to share info w Contact Information: [phone]  Emotional Assessment   Attitude/Demeanor/Rapport: Unable to Assess Affect (typically observed): Unable to Assess Orientation: : Oriented to Self, Oriented to Situation, Oriented to Place(Some Dementia suspected) Alcohol / Substance Use: Not Applicable Psych Involvement: No (comment)  Admission diagnosis:  Hypoglycemia [E16.2] Acute respiratory failure with hypoxia ([place]) [J96.01] History of COVID-19 [Z86.16] Patient Active Problem List   Diagnosis Date Noted  . History of CVA (cerebrovascular accident) 08/22/2019  . Acute respiratory failure with hypoxia ([place]) 08/22/2019  . Complicated  urinary tract infection 08/22/2019  . Elevated ALT measurement 08/22/2019  . Acute metabolic encephalopathy 67/34/1937  . Prolonged QT interval 08/22/2019  . CKD (chronic kidney disease), stage III 08/22/2019  . Pressure injury of skin  08/14/2019  . Pneumonia due to COVID-19 virus   . Hypoglycemia due to insulin 08/04/2019  . CKD (chronic kidney disease), stage IV ([place]) 08/04/2019  . Nausea & vomiting 08/04/2019  . Essential hypertension 08/04/2019  . DM (diabetes mellitus), type 2 with renal complications ([place]) 90/24/0973  . CAD (coronary artery disease) 08/04/2019  . COVID-19 08/04/2019  . Diabetic retinopathy ([place])    PCP:  Nelwyn Salisbury, PA-C Pharmacy:   [facility], [place] [address] Phone: [phone] Fax: [phone]     Social Determinants of Health (SDOH) Interventions    Readmission Risk Interventions Readmission Risk Prevention Plan 08/25/2019 08/17/2019  Transportation Screening Complete Complete  PCP or Specialist Appt within 3-5 Days - Complete  HRI or [place] - Complete  Social Work Consult for [place] Planning/Counseling - Complete  Palliative Care Screening - Not Applicable  Medication Review Press photographer) Complete Complete  PCP or Specialist appointment within 3-5 days of discharge Complete -  [place] or Home Care Consult Complete -  SW Recovery Care/Counseling Consult Complete -  Palliative Care Screening Not Applicable -  [place] Complete -

## 2019-08-25 NOTE — Progress Notes (Signed)
Pt having episodes of junctional bradycardia. MD made aware

## 2019-08-26 LAB — GLUCOSE, CAPILLARY
Glucose-Capillary: 121 mg/dL — ABNORMAL HIGH (ref 70–99)
Glucose-Capillary: 211 mg/dL — ABNORMAL HIGH (ref 70–99)

## 2019-08-26 MED ORDER — INSULIN ASPART 100 UNIT/ML ~~LOC~~ SOLN: SUBCUTANEOUS | 11 refills | Status: DC

## 2019-08-26 MED ORDER — TAMSULOSIN HCL 0.4 MG PO CAPS: 0.8000 mg | ORAL_CAPSULE | Freq: Every day | ORAL | 3 refills | Status: AC

## 2019-08-26 MED ORDER — INSULIN GLARGINE 100 UNIT/ML ~~LOC~~ SOLN: 6.0000 [IU] | Freq: Every day | SUBCUTANEOUS | 11 refills | Status: AC

## 2019-08-26 MED ORDER — INSULIN ASPART 100 UNIT/ML ~~LOC~~ SOLN: SUBCUTANEOUS | 11 refills | Status: AC

## 2019-08-26 MED ORDER — PREDNISONE 10 MG PO TABS: ORAL_TABLET | ORAL | Status: AC

## 2019-08-26 MED ORDER — INSULIN ASPART 100 UNIT/ML ~~LOC~~ SOLN
0.0000 [IU] | Freq: Three times a day (TID) | SUBCUTANEOUS | Status: DC
  Administered 2019-08-26: 3 [IU] via SUBCUTANEOUS

## 2019-08-26 MED ORDER — FINASTERIDE 5 MG PO TABS: 5.0000 mg | ORAL_TABLET | Freq: Every day | ORAL | Status: AC

## 2019-08-26 NOTE — Progress Notes (Signed)
Physical Therapy Treatment Patient Details Name: Lucas Hobbs MRN: [id number] DOB: [birth year] Today's Date: 08/26/2019    History of Present Illness 64 year old male admitted 08/22/19 for severe hypoglycemia, acute metabolic encephalopathy, and hypoxemia likely due to HCAP. Patient had a recent admission 3/30-4/13 for hypoxia due to COVID PNA and it appears he was discharge home with home PT services (?). This admission, MRI brain 4/19 showed no acute abnormality but did show chronic multifactorial infaracts R MCA. He may also have a component of baseline dementia as wife reports memory issues x several years. Outpatient neurology follow-up recommended. Multifactorial respiratory failure from residual COVID pneumonitis and probable bacterial coinfection. Procalcitonin also mildly elevated. Urine cultures negative. PMH: insulin-dependent DM-2, CKD stage IV, CVA, CAD s/p CABG, HTN     PT Comments    Patient progressing his mobility and activity tolerance. He continues to be unsteady and requires hands on assistance with use of RW including assistance for RW management in small spaces, turns, and obstacle negotiation. Continued recommendation for SNF for short term rehabilitation.    Follow Up Recommendations  SNF;Supervision/Assistance - 24 hour     Equipment Recommendations  Rolling walker with 5" wheels       Precautions / Restrictions Precautions Precautions: Fall Restrictions Weight Bearing Restrictions: No    Mobility  Bed Mobility Overal bed mobility: Needs Assistance Bed Mobility: Supine to Sit     Supine to sit: Supervision        Transfers Overall transfer level: Needs assistance Equipment used: Rolling walker (2 wheeled) Transfers: Sit to/from Stand Sit to Stand: Min guard;Min assist         General transfer comment: Cues for hand placement. Despite cues, patient pulled on RW to stand from EOB. Sit<>stand from recliner chair x 2 trials at end of session with  contact guard assistance as patient utilizing proper technique.   Ambulation/Gait Ambulation/Gait assistance: Min assist Gait Distance (Feet): 100 Feet Assistive device: Rolling walker (2 wheeled) Gait Pattern/deviations: Step-through pattern;Drifts right/left Gait velocity: varied   General Gait Details: Cues and assist for obstacle negotiation.    Balance Overall balance assessment: Needs assistance Sitting-balance support: Feet supported Sitting balance-Leahy Scale: Good     Standing balance support: No upper extremity supported Standing balance-Leahy Scale: Fair    Cognition Arousal/Alertness: Awake/alert Behavior During Therapy: Flat affect Overall Cognitive Status: History of cognitive impairments - at baseline  General Comments: PMH: Asperger's, wife reports memory issues x a few years per chart review         General Comments General comments (skin integrity, edema, etc.): Patient on room air. Oxygen saturation 95% at ret, HR 79 bpm, RR 20. O2 sat down to 93%, HR up to 132 bpm with ambulation trial.      Pertinent Vitals/Pain Pain Assessment: No/denies pain           PT Goals (current goals can now be found in the care plan section) Progress towards PT goals: Progressing toward goals    Frequency    Min 2X/week      PT Plan Current plan remains appropriate       AM-PAC PT "6 Clicks" Mobility   Outcome Measure  Help needed turning from your back to your side while in a flat bed without using bedrails?: None Help needed moving from lying on your back to sitting on the side of a flat bed without using bedrails?: None Help needed moving to and from a bed to a chair (including a wheelchair)?:  A Little Help needed standing up from a chair using your arms (e.g., wheelchair or bedside chair)?: A Little Help needed to walk in hospital room?: A Little Help needed climbing 3-5 steps with a railing? : A Lot 6 Click Score: 19    End of Session Equipment  Utilized During Treatment: Gait belt Activity Tolerance: Patient tolerated treatment well Patient left: in chair;with call bell/phone within reach;with chair alarm set Nurse Communication: Mobility status PT Visit Diagnosis: Unsteadiness on feet (R26.81);Other abnormalities of gait and mobility (R26.89)     Time: 7989-2119 PT Time Calculation (min) (ACUTE ONLY): 16 min  Charges:  $Gait Training: 8-22 mins                     Birdie Hopes, PT, DPT Acute Rehab [phone] office     Birdie Hopes 08/26/2019, 2:04 PM

## 2019-08-26 NOTE — Discharge Summary (Addendum)
PATIENT DETAILS Name: Lucas Hobbs Age: 64 y.o. Sex: male Date of Birth: [birth year] MRN: [id number]. Admitting Physician: Lucas Morton, MD IDP:[id number], Lucas Roch, PA-C  Admit Date: 08/22/2019 Discharge date: 08/26/2019  Recommendations for Outpatient Follow-up:  1. Follow up with PCP in 1-2 weeks 2. Please obtain CMP/CBC in one week 3. Repeat Chest Xray in 4-6 week 4. Please ensure follow-up with urology, neurology 5. Appears to have brittle diabetes-watch for episodes of hypoglycemia  Admitted From:  Home  Disposition: [place]: No  Equipment/Devices: None  Discharge Condition: Stable  CODE STATUS: FULL CODE  Diet recommendation:  Diet Order            Diet - low sodium heart healthy        Diet Carb Modified        Diet heart healthy/carb modified Room service appropriate? Yes; Fluid consistency: Thin  Diet effective now               Brief Narrative: Patient is a 64 y.o. male with PMHx of insulin-dependent DM-2, CKD stage IV, CVA, CAD s/p CABG, HTN who was hospitalized from 3/30-4/13 due to hypoxia from Covid 19 pneumonia, he returned to the emergency room due to severe hypoglycemia, acute metabolic encephalopathy and was found to be hypoxemic likely secondary to HCAP.  See below for further details.  Significant Events: 4/18>> admit to [facility] for hypoglycemia, metabolic encephalopathy and hypoxemia 4/18>> MRI brain: No acute infarct 3/30-4/13 admit for hypoxia from Covid 19 pneumonia.  COVID-19 medications: Steroids: 3/31>> 4/9 Remdesivir: 3/31>> 4/4  Antibiotics: Cefepime: 4/19>>4/22 Doxycycline: 4/18>>4/22 Ceftriaxone: 4/18 x 1  Microbiology data: 4/18: Blood culture>> no growth 4/18: Urine culture>> no growth   Procedures: None  Consults: None  Brief Hospital Course: Hypoglycemia: Secondary to excessive insulin administration-his dosage of insulin was decreased during his most recent hospitalization.  No further  hypoglycemic episodes.  Recent Labs    08/25/19 1647 08/25/19 2040 08/26/19 0745  GLUCAP 237* 188* 121*    Acute metabolic encephalopathy: Suspect secondary to above-MRI brain on 4/19-no acute intracranial abnormality-chronic multifocal infarcts in the right MCA.  Per patient spouse-patient has had memory issues for the past several years-suspect he may have some amount of undiagnosed dementia at baseline.  Suggest outpatient neurology follow-up.  Acute hypoxemic respiratory failure: Resolved-titrated to room air on 4/21-remains on room air.  Multifactorial etiology-suspect he has some residual Covid related inflammation/pneumonitis-probable bacterial coinfection as well as procalcitonin mildly elevated.  Does not appear to have active Covid infection at this time.   Has completed a course of cefepime/doxycycline-since CRP elevated-remains on tapering steroids.  No longer on IV heparin has CT chest negative for PE.  Recent history of COVID-19 infection/pneumonia: Completed a course of remdesivir last admission.  He is beyond 21 days-does not require any further isolation.  He does not have active Covid infection at this time.  Lab Results  Component Value Date   SARSCOV2NAA POSITIVE (A) 08/04/2019    ?Complicated UTI: Does not have UTI-urine cultures negative.  On empiric antibiotics for possible bacterial pneumonia.  Minimally elevated troponin: Suspect from underlying CKD/demand ischemia from acute illness-trend is flat-not consistent with ACS.-He does have a history of CAD and is s/p CABG.  History of acute urinary retention during his most recent hospitalization Foley catheter in place:Attempted voiding trial on 4/20-developed acute urinary retention-Foley catheter was reinserted.  Continue Flomax but change to 0.8 mg daily-add finasteride.  Will need outpatient urology follow-up for voiding trial and  further evaluation.  HTN: BP controlled-continue amlodipine.  Given relative  stability and renal function-should be going to continue with lisinopril.  Please monitor renal function closely.  Optimize antihypertensive regimen in the outpatient setting.  CAD s/p CABG: No anginal symptoms-continue aspirin, statin.  Brief episode of bradycardia on 4/21: Asymptomatic-appears to be a transient junctional rhythm-beta-blocker held-no further events overnight.  Continue to monitor closely-ensure outpatient follow-up with cardiology.  History of chronic CVA seen on imaging studies: Nonfocal exam-MRI with no acute CVA-continue aspirin/statin.  Per patient spouse-she is not aware of a prior diagnosis of CVA for which the patient was admitted to the hospital with.  Could have been asymptomatic.  His clinical features on admission was not compatible with CVA.  QTC prolongation: Resolved-after correction of hypomagnesemia and hypokalemia.    Avoid QTC prolonging agents  Nausea/vomiting: Resolved-with antiemetics  DM-2 (A1c 7.5 on 08/05/2019): No further hypoglycemic episodes-CBGs on the higher side as patient on low-dose steroids-however as steroids get titrated down-is at risk for hypoglycemia-decrease Lantus to 6 units-continue SSI.  Please follow closely while at SNF-patient appears to have brittle diabetes.    Recent Labs    08/25/19 1647 08/25/19 2040 08/26/19 0745  GLUCAP 237* 188* 121*    CKD stage IV: Creatinine close to baseline-monitor periodically-avoid nephrotoxic agents.  ?Dementia/cognitive dysfunction: Spouse reports several years of memory issues/confusion-probably has some amount of lingering encephalopathy from recent Covid infection hypoxemia/hypoglycemia.  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  Have asked spouse to make appointment with neurology in the outpatient setting when he has recovered from his acute illness.   Have also sent a epic referral to neurology.  TSH, vitamin B12 within normal limits. RPR non reactive.  Patient is improved and currently back to baseline.  Hx of seminoma s/p Left orchiectomy  Discharge Diagnoses:  Principal Problem:   Acute metabolic encephalopathy Active Problems:   Diabetic retinopathy (HCC)   Hypoglycemia due to insulin   Nausea & vomiting   CAD (coronary artery disease)   Pneumonia due to COVID-19 virus   History of CVA (cerebrovascular accident)   Acute respiratory failure with hypoxia (HCC)   Complicated urinary tract infection   Elevated ALT measurement   Prolonged QT interval   CKD (chronic kidney disease), stage IV   Discharge Instructions:    Person Under Monitoring Name: Lucas Hobbs  Location: [address] [place] [address]   Infection Prevention Recommendations for Individuals Confirmed to have, or Being Evaluated for, 2019 Novel Coronavirus (COVID-19) Infection Who Receive Care at Home  Individuals who are confirmed to have, or are being evaluated for, COVID-19 should follow the prevention steps below until a healthcare provider or local or state health department says they can return to normal activities.  Stay home except to get medical care You should restrict activities outside your home, except for getting medical care. Do not go to work, school, or public areas, and do not use public transportation or taxis.  Call ahead before visiting your doctor Before your medical appointment, call the healthcare provider and tell them that you have, or are being evaluated for, COVID-19 infection. This will help the healthcare provider's office take steps to keep other people from getting infected. Ask your healthcare provider to call the local or state health department.  Monitor your symptoms Seek prompt medical attention if your illness is worsening (e.g., difficulty breathing). Before going  to your medical appointment, call the  healthcare provider and tell them that you have, or are being evaluated for, COVID-19 infection. Ask your healthcare provider to call the local or state health department.  Wear a facemask You should wear a facemask that covers your nose and mouth when you are in the same room with other people and when you visit a healthcare provider. People who live with or visit you should also wear a facemask while they are in the same room with you.  Separate yourself from other people in your home As much as possible, you should stay in a different room from other people in your home. Also, you should use a separate bathroom, if available.  Avoid sharing household items You should not share dishes, drinking glasses, cups, eating utensils, towels, bedding, or other items with other people in your home. After using these items, you should wash them thoroughly with soap and water.  Cover your coughs and sneezes Cover your mouth and nose with a tissue when you cough or sneeze, or you can cough or sneeze into your sleeve. Throw used tissues in a lined trash can, and immediately wash your hands with soap and water for at least 20 seconds or use an alcohol-based hand rub.  Wash your [facility] your hands often and thoroughly with soap and water for at least 20 seconds. You can use an alcohol-based hand sanitizer if soap and water are not available and if your hands are not visibly dirty. Avoid touching your eyes, nose, and mouth with unwashed hands.   Prevention Steps for Caregivers and Household Members of Individuals Confirmed to have, or Being Evaluated for, COVID-19 Infection Being Cared for in the Home  If you live with, or provide care at home for, a person confirmed to have, or being evaluated for, COVID-19 infection please follow these guidelines to prevent infection:  Follow healthcare provider's instructions Make sure that you understand and can help the patient follow any healthcare  provider instructions for all care.  Provide for the patient's basic needs You should help the patient with basic needs in the home and provide support for getting groceries, prescriptions, and other personal needs.  Monitor the patient's symptoms If they are getting sicker, call his or her medical provider and tell them that the patient has, or is being evaluated for, COVID-19 infection. This will help the healthcare provider's office take steps to keep other people from getting infected. Ask the healthcare provider to call the local or state health department.  Limit the number of people who have contact with the patient  If possible, have only one caregiver for the patient.  Other household members should stay in another home or place of residence. If this is not possible, they should stay  in another room, or be separated from the patient as much as possible. Use a separate bathroom, if available.  Restrict visitors who do not have an essential need to be in the home.  Keep older adults, very young children, and other sick people away from the patient Keep older adults, very young children, and those who have compromised immune systems or chronic health conditions away from the patient. This includes people with chronic heart, lung, or kidney conditions, diabetes, and cancer.  Ensure good ventilation Make sure that shared spaces in the home have good air flow, such as from an air conditioner or an opened window, weather permitting.  Wash your hands often  Wash your hands often and  thoroughly with soap and water for at least 20 seconds. You can use an alcohol based hand sanitizer if soap and water are not available and if your hands are not visibly dirty.  Avoid touching your eyes, nose, and mouth with unwashed hands.  Use disposable paper towels to dry your hands. If not available, use dedicated cloth towels and replace them when they become wet.  Wear a facemask and  gloves  Wear a disposable facemask at all times in the room and gloves when you touch or have contact with the patient's blood, body fluids, and/or secretions or excretions, such as sweat, saliva, sputum, nasal mucus, vomit, urine, or feces.  Ensure the mask fits over your nose and mouth tightly, and do not touch it during use.  Throw out disposable facemasks and gloves after using them. Do not reuse.  Wash your hands immediately after removing your facemask and gloves.  If your personal clothing becomes contaminated, carefully remove clothing and launder. Wash your hands after handling contaminated clothing.  Place all used disposable facemasks, gloves, and other waste in a lined container before disposing them with other household waste.  Remove gloves and wash your hands immediately after handling these items.  Do not share dishes, glasses, or other household items with the patient  Avoid sharing household items. You should not share dishes, drinking glasses, cups, eating utensils, towels, bedding, or other items with a patient who is confirmed to have, or being evaluated for, COVID-19 infection.  After the person uses these items, you should wash them thoroughly with soap and water.  Wash laundry thoroughly  Immediately remove and wash clothes or bedding that have blood, body fluids, and/or secretions or excretions, such as sweat, saliva, sputum, nasal mucus, vomit, urine, or feces, on them.  Wear gloves when handling laundry from the patient.  Read and follow directions on labels of laundry or clothing items and detergent. In general, wash and dry with the warmest temperatures recommended on the label.  Clean all areas the individual has used often  Clean all touchable surfaces, such as counters, tabletops, doorknobs, bathroom fixtures, toilets, phones, keyboards, tablets, and bedside tables, every day. Also, clean any surfaces that may have blood, body fluids, and/or secretions or  excretions on them.  Wear gloves when cleaning surfaces the patient has come in contact with.  Use a diluted bleach solution (e.g., dilute bleach with 1 part bleach and 10 parts water) or a household disinfectant with a label that says EPA-registered for coronaviruses. To make a bleach solution at home, add 1 tablespoon of bleach to 1 quart (4 cups) of water. For a larger supply, add  cup of bleach to 1 gallon (16 cups) of water.  Read labels of cleaning products and follow recommendations provided on product labels. Labels contain instructions for safe and effective use of the cleaning product including precautions you should take when applying the product, such as wearing gloves or eye protection and making sure you have good ventilation during use of the product.  Remove gloves and wash hands immediately after cleaning.  Monitor yourself for signs and symptoms of illness Caregivers and household members are considered close contacts, should monitor their health, and will be asked to limit movement outside of the home to the extent possible. Follow the monitoring steps for close contacts listed on the symptom monitoring form.   ? If you have additional questions, contact your local health department or call the epidemiologist on call at [phone] (available 24/7). ?  This guidance is subject to change. For the most up-to-date guidance from CDC, please refer to their website: YouBlogs.pl    Activity:  As tolerated with Full fall precautions use walker/cane & assistance as needed  Discharge Instructions    Ambulatory referral to Neurology   Complete by: As directed    An appointment is requested in approximately: 2 weeks   Diet - low sodium heart healthy   Complete by: As directed    Diet Carb Modified   Complete by: As directed    Discharge instructions   Complete by: As directed    1.)  CBGs before meals and at  bedtime   Increase activity slowly   Complete by: As directed      Allergies as of 08/26/2019      Reactions   Iodine Hives      Medication List    STOP taking these medications   insulin NPH-regular Human (70-30) 100 UNIT/ML injection   metoprolol succinate 100 MG 24 hr tablet Commonly known as: TOPROL-XL   prochlorperazine 10 MG tablet Commonly known as: COMPAZINE   zinc sulfate 220 (50 Zn) MG capsule     TAKE these medications   amLODipine 10 MG tablet Commonly known as: NORVASC Take 10 mg by mouth daily.   ascorbic acid 500 MG tablet Commonly known as: VITAMIN C Take 1 tablet (500 mg total) by mouth daily.   aspirin 81 MG chewable tablet Chew 81 mg by mouth daily.   atorvastatin 40 MG tablet Commonly known as: LIPITOR Take 40 mg by mouth every evening.   finasteride 5 MG tablet Commonly known as: PROSCAR Take 1 tablet (5 mg total) by mouth daily. Start taking on: August 27, 2019   insulin aspart 100 UNIT/ML injection Commonly known as: novoLOG 0-9 Units, Subcutaneous, 3 times daily with meals CBG < 70: Implement Hypoglycemia measures CBG 70 - 120: 0 units CBG 121 - 150: 1 unit CBG 151 - 200: 2 units CBG 201 - 250: 3 units CBG 251 - 300: 5 units CBG 301 - 350: 7 units CBG 351 - 400: 9 units CBG > 400: call MD and obtain STAT lab verification   insulin glargine 100 UNIT/ML injection Commonly known as: LANTUS Inject 0.06 mLs (6 Units total) into the skin daily. Start taking on: August 27, 2019   lisinopril 20 MG tablet Commonly known as: ZESTRIL Take 20 mg by mouth daily.   ondansetron 4 MG tablet Commonly known as: ZOFRAN Take 4 mg by mouth every 6 (six) hours as needed for nausea or vomiting.   predniSONE 10 MG tablet Commonly known as: DELTASONE 20 mg p.o. daily for 2 days, then, 10 mg p.o. daily for 2 days and then stop   tamsulosin 0.4 MG Caps capsule Commonly known as: FLOMAX Take 2 capsules (0.8 mg total) by mouth daily. What  changed: how much to take       Contact information for follow-up providers    [facility] [place], [place]. Schedule an appointment as soon as possible for a visit in 1 week(s).   Specialty: Physician Assistant Contact information: [place] [address] [phone]        [place]. Schedule an appointment as soon as possible for a visit in 1 week(s).   Contact information: [place] [phone]           Contact information for after-discharge care    Destination  [facility] .   Service: Skilled Nursing Contact information: [place] [address] [phone]                 Allergies  Allergen Reactions  . Iodine Hives     Other Procedures/Studies: DG Chest 1 View  Result Date: 08/11/2019 CLINICAL DATA:  COVID-19 positive, hypoxemia. EXAM: CHEST  1 VIEW COMPARISON:  August 07, 2019. FINDINGS: Stable cardiomediastinal silhouette. Sternotomy wires are noted. No pneumothorax or pleural effusion is noted. Bilateral lung opacities are decreased compared to prior exam suggesting improving multifocal pneumonia. Bony thorax is unremarkable. IMPRESSION: Improving bilateral lung opacities are noted suggesting improving multifocal pneumonia. Electronically Signed   By: Marijo Conception M.D.   On: 08/11/2019 15:57   DG Chest 2 View  Result Date: 08/03/2019 CLINICAL DATA:  Vomiting, short of breath, hiccups EXAM: CHEST - 2 VIEW COMPARISON:  None. FINDINGS: Frontal and lateral views of the chest demonstrate an unremarkable cardiac silhouette. Postsurgical changes are seen from bypass surgery. No airspace disease, effusion, or pneumothorax. No acute bony abnormalities. IMPRESSION: 1. No acute intrathoracic process. Electronically Signed   By: Randa Ngo M.D.   On: 08/03/2019 23:22   DG Abd 1 View  Result Date: 08/14/2019 CLINICAL DATA:   Nausea vomiting EXAM: ABDOMEN - 1 VIEW COMPARISON:  Abdominal film 08/09/2019 FINDINGS: Post median sternotomy. Lung bases with scattered airspace disease similar to prior chest x-ray for abdominal series. No signs of bowel obstruction. Stool and gas throughout the colon. Stool and rectal gas. No acute bone finding. No signs of abnormal calcification. IMPRESSION: Scattered basilar airspace disease better assessed on previous dedicated chest radiography. No signs of bowel obstruction. Electronically Signed   By: Zetta Bills M.D.   On: 08/14/2019 15:34   CT HEAD WO CONTRAST  Result Date: 08/06/2019 CLINICAL DATA:  Delirium EXAM: CT HEAD WITHOUT CONTRAST TECHNIQUE: Contiguous axial images were obtained from the base of the skull through the vertex without intravenous contrast. COMPARISON:  None. FINDINGS: Brain: No acute hemorrhage, acute infarction or space-occupying mass lesion is seen. Multifocal infarcts are noted on the right with encephalomalacia predominately within the distribution of the right middle cerebral artery. Vascular: No hyperdense vessel or unexpected calcification. Skull: Normal. Negative for fracture or focal lesion. Sinuses/Orbits: Mucosal retention cyst is noted in the left maxillary antrum. 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 No acute abnormality noted. Electronically Signed   By: Inez Catalina M.D.   On: 08/06/2019 20:16   CT Angio Chest PE W and/or Wo Contrast  Result Date: 08/22/2019 CLINICAL DATA:  Hypoxemia EXAM: CT ANGIOGRAPHY CHEST WITH CONTRAST TECHNIQUE: Multidetector CT imaging of the chest was performed using the standard protocol during bolus administration of intravenous contrast. Multiplanar CT image reconstructions and MIPs were obtained to evaluate the vascular anatomy. CONTRAST:  35mL OMNIPAQUE IOHEXOL 350 MG/ML SOLN COMPARISON:  None. FINDINGS: Cardiovascular: There is no pulmonary  embolism identified within the main, lobar or segmental pulmonary arteries bilaterally. Aortic atherosclerosis. No thoracic aortic aneurysm. No pericardial effusion. Three-vessel coronary artery atherosclerosis. Median sternotomy for presumed CABG. Mediastinum/Nodes: No mass or enlarged lymph nodes within the mediastinum or perihilar regions. Esophagus appears normal. Trachea appears normal. Lungs/Pleura: Patchy nodular consolidations and ground-glass opacities throughout both lungs, most prominent at the lung bases, consistent with multifocal pneumonia. No pleural effusion or pneumothorax. Upper Abdomen: Limited images of the upper abdomen are unremarkable. Musculoskeletal: Median sternotomy  wires in place. No acute or suspicious osseous finding. Review of the MIP images confirms the above findings. IMPRESSION: 1. Patchy nodular consolidations and ground-glass opacities throughout both lungs, most prominent at the lung bases, most likely multifocal pneumonia. COVID 19 pneumonia can have this appearance. Differential includes atypical pneumonias such as viral or fungal, interstitial pneumonias, edema related to volume overload/CHF, chronic interstitial diseases, hypersensitivity pneumonitis, and respiratory bronchiolitis. 2. No pulmonary embolism seen. Aortic Atherosclerosis (ICD10-I70.0). Electronically Signed   By: Franki Cabot M.D.   On: 08/22/2019 14:24   MR BRAIN WO CONTRAST  Result Date: 08/23/2019 CLINICAL DATA:  64 year old male with subacute neurologic deficits. Delirium earlier this month. EXAM: MRI HEAD WITHOUT CONTRAST TECHNIQUE: Multiplanar, multiecho pulse sequences of the brain and surrounding structures were obtained without intravenous contrast. COMPARISON:  Head CT 08/06/2019. FINDINGS: Brain: Multifocal right hemisphere developing encephalomalacia with areas of laminar necrosis and hemosiderin. These are within the right MCA territory and correspond to the abnormal hypodensity on CT earlier  this month. None of these areas is definitely restricted on diffusion. No restricted diffusion to suggest acute infarction. No midline shift, mass effect, evidence of mass lesion, ventriculomegaly, extra-axial collection or acute intracranial hemorrhage. Cervicomedullary junction and pituitary are within normal limits. Outside of the right MCA territory there is patchy bilateral cerebral white matter T2 and FLAIR hyperintensity, and occasional chronic micro hemorrhages (posterior left centrum semiovale series 14, image 40). But no other cortical encephalomalacia. However, there is evidence of chronic lacunar infarcts in the right basal ganglia and thalamus. Brainstem and cerebellum appear negative. Vascular: Major intracranial vascular flow voids are preserved, the left vertebral artery appears dominant. Skull and upper cervical spine: Negative visible cervical spine and bone marrow signal. Sinuses/Orbits: Negative orbits. Trace paranasal sinus mucosal thickening. Other: Trace bilateral mastoid air cell fluid, probably postinflammatory with negative visible nasopharynx. Other visible internal auditory structures appear normal. Negative scalp and face soft tissues. IMPRESSION: 1. No acute intracranial abnormality. 2. 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 Associated laminar necrosis, hemosiderin, and developing encephalomalacia. 3. Superimposed chronic small vessel disease in the right deep gray nuclei and bilateral white matter with occasional other chronic microhemorrhages. Electronically Signed   By: Genevie Ann M.D.   On: 08/23/2019 05:23   US Abdomen Complete  Result Date: 08/04/2019 CLINICAL DATA:  Nausea and vomiting for 7 days EXAM: ABDOMEN ULTRASOUND COMPLETE COMPARISON:  CT from earlier today FINDINGS: Gallbladder: Cholelithiasis. No wall thickening or focal tenderness. Common bile duct: Diameter: 4 mm Liver: No focal lesion  identified. Within normal limits in parenchymal echogenicity. Portal vein is patent on color Doppler imaging with normal direction of blood flow towards the liver. IVC: No abnormality visualized. Pancreas: Obscured. Spleen: Size and appearance within normal limits. Right Kidney: Length: 12 cm. Right lower pole cyst measuring 19 mm, correlating with hemorrhagic cyst on prior CT. A thin benign-appearing septation is present. Left Kidney: Length: 11.6 cm. Echogenicity within normal limits. No mass or hydronephrosis visualized. Abdominal aorta: No aneurysm visualized.  Atherosclerosis IMPRESSION: 1. No additional finding when compared to CT earlier the same day. 2. Cholelithiasis. Electronically Signed   By: Monte Fantasia M.D.   On: 08/04/2019 06:38   US RENAL  Result Date: 08/10/2019 CLINICAL DATA:  Acute renal failure EXAM: RENAL / URINARY TRACT ULTRASOUND COMPLETE COMPARISON:  Ultrasound abdomen 08/04/2019 FINDINGS: Right Kidney: Renal measurements: 10.2 x 5.2 x 6.8 cm = volume: 190 mL. Negative for renal  obstruction. Several small renal cysts are present largest measuring 1.5 cm. Normal cortical echogenicity. Left Kidney: Renal measurements: 9.8 x 4.9 x 4.8 cm = volume: 121 mL. Echogenicity within normal limits. No mass or hydronephrosis visualized. Bladder: Normal bladder. The bladder is full with a Foley catheter inflated in the bladder. Other: None. IMPRESSION: No renal obstruction.  Small right renal cyst noted. Bladder is full with a Foley catheter in place. Electronically Signed   By: Franchot Gallo M.D.   On: 08/10/2019 14:56   DG Chest Port 1 View  Result Date: 08/22/2019 CLINICAL DATA:  Unresponsive, tachycardia, hyperglycemia EXAM: PORTABLE CHEST 1 VIEW COMPARISON:  08/11/2019 FINDINGS: 2 frontal views of the chest demonstrate a stable cardiac silhouette. Postsurgical changes from bypass surgery. There are persistent but improving bibasilar airspace opacities, left greater than right. No effusion  or pneumothorax. No acute bony abnormalities. IMPRESSION: 1. Persistent but improving bibasilar airspace disease, consistent with multifocal pneumonia. Electronically Signed   By: Randa Ngo M.D.   On: 08/22/2019 03:57   DG CHEST PORT 1 VIEW  Result Date: 08/07/2019 CLINICAL DATA:  Hypoxia EXAM: PORTABLE CHEST 1 VIEW COMPARISON:  August 04, 2019 FINDINGS: There is new airspace consolidation in the left mid and lower lung zones as well as in the right upper lobe and right base. Heart is upper normal in size with pulmonary vascularity normal. Patient is status post coronary artery bypass grafting. No adenopathy. No bone lesions. IMPRESSION: Multifocal airspace opacity, new from prior study. Appearance consistent with multifocal pneumonia. Stable cardiac silhouette. Electronically Signed   By: Lowella Grip III M.D.   On: 08/07/2019 08:40   DG Abd Portable 2V  Result Date: 08/09/2019 CLINICAL DATA:  COVID-19 positive. EXAM: PORTABLE ABDOMEN - 2 VIEW COMPARISON:  None. FINDINGS: The bowel gas pattern is normal. There is no evidence of free air. No radio-opaque calculi or other significant radiographic abnormality is seen. IMPRESSION: No evidence of bowel obstruction or ileus. Electronically Signed   By: Marijo Conception M.D.   On: 08/09/2019 12:08   ECHOCARDIOGRAM COMPLETE  Result Date: 08/04/2019    ECHOCARDIOGRAM REPORT   Patient Name:   Lucas Hobbs Date of Exam: 08/04/2019 Medical Rec #:  [id number]   Height:       67.0 in Accession #:    [id number]  Weight:       187.4 lb Date of Birth:  Jan 07, 1956  BSA:          1.967 m Patient Age:    48 years    BP:           166/71 mmHg Patient Gender: M           HR:           75 bpm. Exam Location:  Inpatient Procedure: 2D Echo Indications:    Elevated Troponin  History:        Patient has no prior history of Echocardiogram examinations.                 Risk Factors:Diabetes and Hypertension. CKD                 Covid19 infection.  Sonographer:    Vikki Ports  [place] Referring Phys: [place]  1. Left ventricular ejection fraction, by estimation, is 55 to 60%. The left ventricle has normal function. The left ventricle has no regional wall motion abnormalities. There is moderate concentric left ventricular hypertrophy. Left ventricular diastolic parameters are indeterminate.  2. Right ventricular systolic function is normal. The right ventricular size is normal.  3. The mitral valve is normal in structure. Trivial mitral valve regurgitation. No evidence of mitral stenosis.  4. The aortic valve has an indeterminant number of cusps. Aortic valve regurgitation is not visualized. No aortic stenosis is present.  5. The inferior vena cava is normal in size with greater than 50% respiratory variability, suggesting right atrial pressure of 3 mmHg. Comparison(s): No prior Echocardiogram. FINDINGS  Left Ventricle: Left ventricular ejection fraction, by estimation, is 55 to 60%. The left ventricle has normal function. The left ventricle has no regional wall motion abnormalities. The left ventricular internal cavity size was normal in size. There is  moderate concentric left ventricular hypertrophy. Left ventricular diastolic parameters are indeterminate. Right Ventricle: The right ventricular size is normal. No increase in right ventricular wall thickness. Right ventricular systolic function is normal. Left Atrium: Left atrial size was normal in size. Right Atrium: Right atrial size was normal in size. Pericardium: There is no evidence of pericardial effusion. Mitral Valve: The mitral valve is normal in structure. Trivial mitral valve regurgitation. No evidence of mitral valve stenosis. Tricuspid Valve: The tricuspid valve is normal in structure. Tricuspid valve regurgitation is trivial. No evidence of tricuspid stenosis. Aortic Valve: The aortic valve has an indeterminant number of cusps. Aortic valve regurgitation is not visualized. No aortic stenosis  is present. There is mild calcification of the aortic valve. Pulmonic Valve: The pulmonic valve was not well visualized. Pulmonic valve regurgitation is not visualized. No evidence of pulmonic stenosis. Aorta: The aortic root, ascending aorta and aortic arch are all structurally normal, with no evidence of dilitation or obstruction. Pulmonary Artery: The pulmonary artery is not well seen. Venous: The inferior vena cava is normal in size with greater than 50% respiratory variability, suggesting right atrial pressure of 3 mmHg. IAS/Shunts: No atrial level shunt detected by color flow Doppler.  LEFT VENTRICLE PLAX 2D LVIDd:         4.78 cm  Diastology LVIDs:         3.10 cm  LV e' lateral:   7.72 cm/s LV PW:         1.51 cm  LV E/e' lateral: 10.5 LV IVS:        1.76 cm  LV e' medial:    5.00 cm/s LVOT diam:     1.80 cm  LV E/e' medial:  16.2 LV SV:         50 LV SV Index:   25 LVOT Area:     2.54 cm  RIGHT VENTRICLE RV S prime:     15.10 cm/s TAPSE (M-mode): 1.8 cm LEFT ATRIUM             Index       RIGHT ATRIUM           Index LA diam:        2.80 cm 1.42 cm/m  RA Area:     12.80 cm LA Vol (A2C):   51.5 ml 26.18 ml/m RA Volume:   26.90 ml  13.68 ml/m LA Vol (A4C):   30.3 ml 15.40 ml/m LA Biplane Vol: 39.9 ml 20.28 ml/m  AORTIC VALVE LVOT Vmax:   111.00 cm/s LVOT Vmean:  78.600 cm/s LVOT VTI:    0.196 m  AORTA Ao Root diam: 3.40 cm MITRAL VALVE MV Area (PHT): 4.36 cm    SHUNTS MV Decel Time: 174 msec    Systemic VTI:  0.20  m MV E velocity: 81.00 cm/s  Systemic Diam: 1.80 cm MV A velocity: 92.40 cm/s MV E/A ratio:  0.88 Buford Dresser MD Electronically signed by Buford Dresser MD Signature Date/Time: 08/04/2019/6:42:54 PM    Final    CT RENAL STONE STUDY  Result Date: 08/04/2019 CLINICAL DATA:  Flank pain with kidney stone suspected. EXAM: CT ABDOMEN AND PELVIS WITHOUT CONTRAST TECHNIQUE: Multidetector CT imaging of the abdomen and pelvis was performed following the standard protocol without IV  contrast. COMPARISON:  12/07/2018 FINDINGS: Lower chest: Patchy airspace disease in the lower lungs. Coronary atherosclerosis. Hepatobiliary: No focal liver abnormality.Minimal layering high-density in the gallbladder consistent with calculi. No wall thickening or inflammatory changes. Pancreas: Unremarkable. Spleen: Unremarkable. Adrenals/Urinary Tract: Negative adrenals. No hydronephrosis or stone. Hyperdense mass at the lower pole right kidney that is stable at 19 mm, consistent with hemorrhagic cyst given density. There is thinning of the cortex at the upper pole on the left where there is a subtle cystic density. Minimal layering calcification in the urinary bladder towards the left, not mural calcification based on prior. Stomach/Bowel:  No obstruction. No appendicitis. Vascular/Lymphatic: No acute vascular abnormality. Diffuse atherosclerotic calcification of the aorta and branch vessels. No mass or adenopathy. Reproductive:Negative Other: No ascites or pneumoperitoneum. Musculoskeletal: Spondylosis without acute finding These results will be called to the ordering clinician or representative by the Radiologist Assistant, and communication documented in the PACS or [employer]. IMPRESSION: 1. Patchy airspace disease in the lower lungs with inflammatory appearance. Please correlate with COVID-19 status. 2. No acute intra-abdominal finding. 3. Small volume layering calculi in the gallbladder and urinary bladder. 4.  Aortic Atherosclerosis (ICD10-I70.0).  Coronary atherosclerosis Electronically Signed   By: Monte Fantasia M.D.   On: 08/04/2019 05:11   VAS [place] LOWER EXTREMITY VENOUS (DVT)  Result Date: 08/08/2019  Lower Venous DVTStudy Indications: Hypoxia, covid+.  Comparison Study: no prior Performing Technologist: Abram Sander RVS  Examination Guidelines: A complete evaluation includes B-mode imaging, spectral Doppler, color Doppler, and power Doppler as needed of all accessible portions of each  vessel. Bilateral testing is considered an integral part of a complete examination. Limited examinations for reoccurring indications may be performed as noted. The reflux portion of the exam is performed with the patient in reverse Trendelenburg.  +---------+---------------+---------+-----------+----------+--------------+ RIGHT    CompressibilityPhasicitySpontaneityPropertiesThrombus Aging +---------+---------------+---------+-----------+----------+--------------+ CFV      Full           Yes      Yes                                 +---------+---------------+---------+-----------+----------+--------------+ SFJ      Full                                                        +---------+---------------+---------+-----------+----------+--------------+ FV Prox  Full                                                        +---------+---------------+---------+-----------+----------+--------------+ FV Mid   Full                                                        +---------+---------------+---------+-----------+----------+--------------+  FV DistalFull                                                        +---------+---------------+---------+-----------+----------+--------------+ PFV      Full                                                        +---------+---------------+---------+-----------+----------+--------------+ POP      Full           Yes      Yes                                 +---------+---------------+---------+-----------+----------+--------------+ PTV      Full                                                        +---------+---------------+---------+-----------+----------+--------------+ PERO     Full                                                        +---------+---------------+---------+-----------+----------+--------------+   +---------+---------------+---------+-----------+----------+--------------+ LEFT      CompressibilityPhasicitySpontaneityPropertiesThrombus Aging +---------+---------------+---------+-----------+----------+--------------+ CFV      Full           Yes      Yes                                 +---------+---------------+---------+-----------+----------+--------------+ SFJ      Full                                                        +---------+---------------+---------+-----------+----------+--------------+ FV Prox  Full                                                        +---------+---------------+---------+-----------+----------+--------------+ FV Mid   Full                                                        +---------+---------------+---------+-----------+----------+--------------+ FV DistalFull                                                        +---------+---------------+---------+-----------+----------+--------------+  PFV      Full                                                        +---------+---------------+---------+-----------+----------+--------------+ POP      Full           Yes      Yes                                 +---------+---------------+---------+-----------+----------+--------------+ PTV      Full                                                        +---------+---------------+---------+-----------+----------+--------------+ PERO     Full                                                        +---------+---------------+---------+-----------+----------+--------------+     Summary: BILATERAL: - No evidence of deep vein thrombosis seen in the lower extremities, bilaterally.   *See table(s) above for measurements and observations. Electronically signed by Curt Jews MD on 08/08/2019 at 8:17:03 AM.    Final      TODAY-DAY OF DISCHARGE:  Subjective:   Lucas Hobbs today has no headache,no chest abdominal pain,no new weakness tingling or numbness, feels much better wants to go home today.    Objective:   Blood pressure (!) 170/82, pulse 71, temperature 97.7 F (36.5 C), temperature source Oral, resp. rate 17, height 5\' 7"  (1.702 m), weight 70.3 kg, SpO2 93 %.  Intake/Output Summary (Last 24 hours) at 08/26/2019 1036 Last data filed at 08/26/2019 0950 Gross per 24 hour  Intake 360 ml  Output 1700 ml  Net -1340 ml   Filed Weights   08/22/19 0243  Weight: 70.3 kg    Exam: Awake Alert,  No new F.N deficits, Normal affect [place].AT,PERRAL Supple Neck,No JVD, No cervical lymphadenopathy appriciated.  Symmetrical Chest wall movement, Good air movement bilaterally, CTAB RRR,No Gallops,Rubs or new Murmurs, No Parasternal Heave +ve B.Sounds, Abd Soft, Non tender, No organomegaly appriciated, No rebound -guarding or rigidity. No Cyanosis, Clubbing or edema, No new Rash or bruise   PERTINENT RADIOLOGIC STUDIES: DG Chest 1 View  Result Date: 08/11/2019 CLINICAL DATA:  COVID-19 positive, hypoxemia. EXAM: CHEST  1 VIEW COMPARISON:  August 07, 2019. FINDINGS: Stable cardiomediastinal silhouette. Sternotomy wires are noted. No pneumothorax or pleural effusion is noted. Bilateral lung opacities are decreased compared to prior exam suggesting improving multifocal pneumonia. Bony thorax is unremarkable. IMPRESSION: Improving bilateral lung opacities are noted suggesting improving multifocal pneumonia. Electronically Signed   By: Marijo Conception M.D.   On: 08/11/2019 15:57   DG Chest 2 View  Result Date: 08/03/2019 CLINICAL DATA:  Vomiting, short of breath, hiccups EXAM: CHEST - 2 VIEW COMPARISON:  None. FINDINGS: Frontal and lateral views of the chest demonstrate an unremarkable cardiac silhouette. Postsurgical changes are seen from bypass surgery. No airspace disease, effusion, or  pneumothorax. No acute bony abnormalities. IMPRESSION: 1. No acute intrathoracic process. Electronically Signed   By: Randa Ngo M.D.   On: 08/03/2019 23:22   DG Abd 1 View  Result Date: 08/14/2019 CLINICAL  DATA:  Nausea vomiting EXAM: ABDOMEN - 1 VIEW COMPARISON:  Abdominal film 08/09/2019 FINDINGS: Post median sternotomy. Lung bases with scattered airspace disease similar to prior chest x-ray for abdominal series. No signs of bowel obstruction. Stool and gas throughout the colon. Stool and rectal gas. No acute bone finding. No signs of abnormal calcification. IMPRESSION: Scattered basilar airspace disease better assessed on previous dedicated chest radiography. No signs of bowel obstruction. Electronically Signed   By: Zetta Bills M.D.   On: 08/14/2019 15:34   CT HEAD WO CONTRAST  Result Date: 08/06/2019 CLINICAL DATA:  Delirium EXAM: CT HEAD WITHOUT CONTRAST TECHNIQUE: Contiguous axial images were obtained from the base of the skull through the vertex without intravenous contrast. COMPARISON:  None. FINDINGS: Brain: No acute hemorrhage, acute infarction or space-occupying mass lesion is seen. Multifocal infarcts are noted on the right with encephalomalacia predominately within the distribution of the right middle cerebral artery. Vascular: No hyperdense vessel or unexpected calcification. Skull: Normal. Negative for fracture or focal lesion. Sinuses/Orbits: Mucosal retention cyst is noted in the left maxillary antrum. 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 No acute abnormality noted. Electronically Signed   By: Inez Catalina M.D.   On: 08/06/2019 20:16   CT Angio Chest PE W and/or Wo Contrast  Result Date: 08/22/2019 CLINICAL DATA:  Hypoxemia EXAM: CT ANGIOGRAPHY CHEST WITH CONTRAST TECHNIQUE: Multidetector CT imaging of the chest was performed using the standard protocol during bolus administration of intravenous contrast. Multiplanar CT image reconstructions and MIPs were obtained to evaluate the vascular anatomy. CONTRAST:  11mL OMNIPAQUE IOHEXOL 350 MG/ML SOLN COMPARISON:  None. FINDINGS: Cardiovascular: There is no  pulmonary embolism identified within the main, lobar or segmental pulmonary arteries bilaterally. Aortic atherosclerosis. No thoracic aortic aneurysm. No pericardial effusion. Three-vessel coronary artery atherosclerosis. Median sternotomy for presumed CABG. Mediastinum/Nodes: No mass or enlarged lymph nodes within the mediastinum or perihilar regions. Esophagus appears normal. Trachea appears normal. Lungs/Pleura: Patchy nodular consolidations and ground-glass opacities throughout both lungs, most prominent at the lung bases, consistent with multifocal pneumonia. No pleural effusion or pneumothorax. Upper Abdomen: Limited images of the upper abdomen are unremarkable. Musculoskeletal: Median sternotomy wires in place. No acute or suspicious osseous finding. Review of the MIP images confirms the above findings. IMPRESSION: 1. Patchy nodular consolidations and ground-glass opacities throughout both lungs, most prominent at the lung bases, most likely multifocal pneumonia. COVID 19 pneumonia can have this appearance. Differential includes atypical pneumonias such as viral or fungal, interstitial pneumonias, edema related to volume overload/CHF, chronic interstitial diseases, hypersensitivity pneumonitis, and respiratory bronchiolitis. 2. No pulmonary embolism seen. Aortic Atherosclerosis (ICD10-I70.0). Electronically Signed   By: Franki Cabot M.D.   On: 08/22/2019 14:24   MR BRAIN WO CONTRAST  Result Date: 08/23/2019 CLINICAL DATA:  64 year old male with subacute neurologic deficits. Delirium earlier this month. EXAM: MRI HEAD WITHOUT CONTRAST TECHNIQUE: Multiplanar, multiecho pulse sequences of the brain and surrounding structures were obtained without intravenous contrast. COMPARISON:  Head CT 08/06/2019. FINDINGS: Brain: Multifocal right hemisphere developing encephalomalacia with areas of laminar necrosis and hemosiderin. These are within the right MCA territory and correspond to the abnormal hypodensity on CT  earlier this month. None of these areas is definitely restricted on diffusion.  No restricted diffusion to suggest acute infarction. No midline shift, mass effect, evidence of mass lesion, ventriculomegaly, extra-axial collection or acute intracranial hemorrhage. Cervicomedullary junction and pituitary are within normal limits. Outside of the right MCA territory there is patchy bilateral cerebral white matter T2 and FLAIR hyperintensity, and occasional chronic micro hemorrhages (posterior left centrum semiovale series 14, image 40). But no other cortical encephalomalacia. However, there is evidence of chronic lacunar infarcts in the right basal ganglia and thalamus. Brainstem and cerebellum appear negative. Vascular: Major intracranial vascular flow voids are preserved, the left vertebral artery appears dominant. Skull and upper cervical spine: Negative visible cervical spine and bone marrow signal. Sinuses/Orbits: Negative orbits. Trace paranasal sinus mucosal thickening. Other: Trace bilateral mastoid air cell fluid, probably postinflammatory with negative visible nasopharynx. Other visible internal auditory structures appear normal. Negative scalp and face soft tissues. IMPRESSION: 1. No acute intracranial abnormality. 2. 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 Associated laminar necrosis, hemosiderin, and developing encephalomalacia. 3. Superimposed chronic small vessel disease in the right deep gray nuclei and bilateral white matter with occasional other chronic microhemorrhages. Electronically Signed   By: Genevie Ann M.D.   On: 08/23/2019 05:23   US Abdomen Complete  Result Date: 08/04/2019 CLINICAL DATA:  Nausea and vomiting for 7 days EXAM: ABDOMEN ULTRASOUND COMPLETE COMPARISON:  CT from earlier today FINDINGS: Gallbladder: Cholelithiasis. No wall thickening or focal tenderness. Common bile duct: Diameter: 4 mm Liver: No focal lesion  identified. Within normal limits in parenchymal echogenicity. Portal vein is patent on color Doppler imaging with normal direction of blood flow towards the liver. IVC: No abnormality visualized. Pancreas: Obscured. Spleen: Size and appearance within normal limits. Right Kidney: Length: 12 cm. Right lower pole cyst measuring 19 mm, correlating with hemorrhagic cyst on prior CT. A thin benign-appearing septation is present. Left Kidney: Length: 11.6 cm. Echogenicity within normal limits. No mass or hydronephrosis visualized. Abdominal aorta: No aneurysm visualized.  Atherosclerosis IMPRESSION: 1. No additional finding when compared to CT earlier the same day. 2. Cholelithiasis. Electronically Signed   By: Monte Fantasia M.D.   On: 08/04/2019 06:38   US RENAL  Result Date: 08/10/2019 CLINICAL DATA:  Acute renal failure EXAM: RENAL / URINARY TRACT ULTRASOUND COMPLETE COMPARISON:  Ultrasound abdomen 08/04/2019 FINDINGS: Right Kidney: Renal measurements: 10.2 x 5.2 x 6.8 cm = volume: 190 mL. Negative for renal obstruction. Several small renal cysts are present largest measuring 1.5 cm. Normal cortical echogenicity. Left Kidney: Renal measurements: 9.8 x 4.9 x 4.8 cm = volume: 121 mL. Echogenicity within normal limits. No mass or hydronephrosis visualized. Bladder: Normal bladder. The bladder is full with a Foley catheter inflated in the bladder. Other: None. IMPRESSION: No renal obstruction.  Small right renal cyst noted. Bladder is full with a Foley catheter in place. Electronically Signed   By: Franchot Gallo M.D.   On: 08/10/2019 14:56   DG Chest Port 1 View  Result Date: 08/22/2019 CLINICAL DATA:  Unresponsive, tachycardia, hyperglycemia EXAM: PORTABLE CHEST 1 VIEW COMPARISON:  08/11/2019 FINDINGS: 2 frontal views of the chest demonstrate a stable cardiac silhouette. Postsurgical changes from bypass surgery. There are persistent but improving bibasilar airspace opacities, left greater than right. No effusion  or pneumothorax. No acute bony abnormalities. IMPRESSION: 1. Persistent but improving bibasilar airspace disease, consistent with multifocal pneumonia. Electronically Signed   By: Randa Ngo M.D.   On: 08/22/2019 03:57   DG CHEST PORT 1 VIEW  Result Date: 08/07/2019 CLINICAL DATA:  Hypoxia EXAM: PORTABLE CHEST 1 VIEW COMPARISON:  August 04, 2019 FINDINGS: There is new airspace consolidation in the left mid and lower lung zones as well as in the right upper lobe and right base. Heart is upper normal in size with pulmonary vascularity normal. Patient is status post coronary artery bypass grafting. No adenopathy. No bone lesions. IMPRESSION: Multifocal airspace opacity, new from prior study. Appearance consistent with multifocal pneumonia. Stable cardiac silhouette. Electronically Signed   By: Lowella Grip III M.D.   On: 08/07/2019 08:40   DG Abd Portable 2V  Result Date: 08/09/2019 CLINICAL DATA:  COVID-19 positive. EXAM: PORTABLE ABDOMEN - 2 VIEW COMPARISON:  None. FINDINGS: The bowel gas pattern is normal. There is no evidence of free air. No radio-opaque calculi or other significant radiographic abnormality is seen. IMPRESSION: No evidence of bowel obstruction or ileus. Electronically Signed   By: Marijo Conception M.D.   On: 08/09/2019 12:08   ECHOCARDIOGRAM COMPLETE  Result Date: 08/04/2019    ECHOCARDIOGRAM REPORT   Patient Name:   Lucas Hobbs Date of Exam: 08/04/2019 Medical Rec #:  [id number]   Height:       67.0 in Accession #:    [id number]  Weight:       187.4 lb Date of Birth:  1956/02/21  BSA:          1.967 m Patient Age:    71 years    BP:           166/71 mmHg Patient Gender: M           HR:           75 bpm. Exam Location:  Inpatient Procedure: 2D Echo Indications:    Elevated Troponin  History:        Patient has no prior history of Echocardiogram examinations.                 Risk Factors:Diabetes and Hypertension. CKD                 Covid19 infection.  Sonographer:    Vikki Ports  [place] Referring Phys: [place]  1. Left ventricular ejection fraction, by estimation, is 55 to 60%. The left ventricle has normal function. The left ventricle has no regional wall motion abnormalities. There is moderate concentric left ventricular hypertrophy. Left ventricular diastolic parameters are indeterminate.  2. Right ventricular systolic function is normal. The right ventricular size is normal.  3. The mitral valve is normal in structure. Trivial mitral valve regurgitation. No evidence of mitral stenosis.  4. The aortic valve has an indeterminant number of cusps. Aortic valve regurgitation is not visualized. No aortic stenosis is present.  5. The inferior vena cava is normal in size with greater than 50% respiratory variability, suggesting right atrial pressure of 3 mmHg. Comparison(s): No prior Echocardiogram. FINDINGS  Left Ventricle: Left ventricular ejection fraction, by estimation, is 55 to 60%. The left ventricle has normal function. The left ventricle has no regional wall motion abnormalities. The left ventricular internal cavity size was normal in size. There is  moderate concentric left ventricular hypertrophy. Left ventricular diastolic parameters are indeterminate. Right Ventricle: The right ventricular size is normal. No increase in right ventricular wall thickness. Right ventricular systolic function is normal. Left Atrium: Left atrial size was normal in size. Right Atrium: Right atrial size was normal in size. Pericardium: There is no evidence of pericardial effusion. Mitral Valve: The mitral  valve is normal in structure. Trivial mitral valve regurgitation. No evidence of mitral valve stenosis. Tricuspid Valve: The tricuspid valve is normal in structure. Tricuspid valve regurgitation is trivial. No evidence of tricuspid stenosis. Aortic Valve: The aortic valve has an indeterminant number of cusps. Aortic valve regurgitation is not visualized. No aortic stenosis  is present. There is mild calcification of the aortic valve. Pulmonic Valve: The pulmonic valve was not well visualized. Pulmonic valve regurgitation is not visualized. No evidence of pulmonic stenosis. Aorta: The aortic root, ascending aorta and aortic arch are all structurally normal, with no evidence of dilitation or obstruction. Pulmonary Artery: The pulmonary artery is not well seen. Venous: The inferior vena cava is normal in size with greater than 50% respiratory variability, suggesting right atrial pressure of 3 mmHg. IAS/Shunts: No atrial level shunt detected by color flow Doppler.  LEFT VENTRICLE PLAX 2D LVIDd:         4.78 cm  Diastology LVIDs:         3.10 cm  LV e' lateral:   7.72 cm/s LV PW:         1.51 cm  LV E/e' lateral: 10.5 LV IVS:        1.76 cm  LV e' medial:    5.00 cm/s LVOT diam:     1.80 cm  LV E/e' medial:  16.2 LV SV:         50 LV SV Index:   25 LVOT Area:     2.54 cm  RIGHT VENTRICLE RV S prime:     15.10 cm/s TAPSE (M-mode): 1.8 cm LEFT ATRIUM             Index       RIGHT ATRIUM           Index LA diam:        2.80 cm 1.42 cm/m  RA Area:     12.80 cm LA Vol (A2C):   51.5 ml 26.18 ml/m RA Volume:   26.90 ml  13.68 ml/m LA Vol (A4C):   30.3 ml 15.40 ml/m LA Biplane Vol: 39.9 ml 20.28 ml/m  AORTIC VALVE LVOT Vmax:   111.00 cm/s LVOT Vmean:  78.600 cm/s LVOT VTI:    0.196 m  AORTA Ao Root diam: 3.40 cm MITRAL VALVE MV Area (PHT): 4.36 cm    SHUNTS MV Decel Time: 174 msec    Systemic VTI:  0.20 m MV E velocity: 81.00 cm/s  Systemic Diam: 1.80 cm MV A velocity: 92.40 cm/s MV E/A ratio:  0.88 Buford Dresser MD Electronically signed by Buford Dresser MD Signature Date/Time: 08/04/2019/6:42:54 PM    Final    CT RENAL STONE STUDY  Result Date: 08/04/2019 CLINICAL DATA:  Flank pain with kidney stone suspected. EXAM: CT ABDOMEN AND PELVIS WITHOUT CONTRAST TECHNIQUE: Multidetector CT imaging of the abdomen and pelvis was performed following the standard protocol without IV  contrast. COMPARISON:  12/07/2018 FINDINGS: Lower chest: Patchy airspace disease in the lower lungs. Coronary atherosclerosis. Hepatobiliary: No focal liver abnormality.Minimal layering high-density in the gallbladder consistent with calculi. No wall thickening or inflammatory changes. Pancreas: Unremarkable. Spleen: Unremarkable. Adrenals/Urinary Tract: Negative adrenals. No hydronephrosis or stone. Hyperdense mass at the lower pole right kidney that is stable at 19 mm, consistent with hemorrhagic cyst given density. There is thinning of the cortex at the upper pole on the left where there is a subtle cystic density. Minimal layering calcification in the urinary bladder towards the left, not mural calcification based  on prior. Stomach/Bowel:  No obstruction. No appendicitis. Vascular/Lymphatic: No acute vascular abnormality. Diffuse atherosclerotic calcification of the aorta and branch vessels. No mass or adenopathy. Reproductive:Negative Other: No ascites or pneumoperitoneum. Musculoskeletal: Spondylosis without acute finding These results will be called to the ordering clinician or representative by the Radiologist Assistant, and communication documented in the PACS or [employer]. IMPRESSION: 1. Patchy airspace disease in the lower lungs with inflammatory appearance. Please correlate with COVID-19 status. 2. No acute intra-abdominal finding. 3. Small volume layering calculi in the gallbladder and urinary bladder. 4.  Aortic Atherosclerosis (ICD10-I70.0).  Coronary atherosclerosis Electronically Signed   By: Monte Fantasia M.D.   On: 08/04/2019 05:11   VAS [place] LOWER EXTREMITY VENOUS (DVT)  Result Date: 08/08/2019  Lower Venous DVTStudy Indications: Hypoxia, covid+.  Comparison Study: no prior Performing Technologist: Abram Sander RVS  Examination Guidelines: A complete evaluation includes B-mode imaging, spectral Doppler, color Doppler, and power Doppler as needed of all accessible portions of each  vessel. Bilateral testing is considered an integral part of a complete examination. Limited examinations for reoccurring indications may be performed as noted. The reflux portion of the exam is performed with the patient in reverse Trendelenburg.  +---------+---------------+---------+-----------+----------+--------------+ RIGHT    CompressibilityPhasicitySpontaneityPropertiesThrombus Aging +---------+---------------+---------+-----------+----------+--------------+ CFV      Full           Yes      Yes                                 +---------+---------------+---------+-----------+----------+--------------+ SFJ      Full                                                        +---------+---------------+---------+-----------+----------+--------------+ FV Prox  Full                                                        +---------+---------------+---------+-----------+----------+--------------+ FV Mid   Full                                                        +---------+---------------+---------+-----------+----------+--------------+ FV DistalFull                                                        +---------+---------------+---------+-----------+----------+--------------+ PFV      Full                                                        +---------+---------------+---------+-----------+----------+--------------+ POP      Full           Yes  Yes                                 +---------+---------------+---------+-----------+----------+--------------+ PTV      Full                                                        +---------+---------------+---------+-----------+----------+--------------+ PERO     Full                                                        +---------+---------------+---------+-----------+----------+--------------+   +---------+---------------+---------+-----------+----------+--------------+ LEFT      CompressibilityPhasicitySpontaneityPropertiesThrombus Aging +---------+---------------+---------+-----------+----------+--------------+ CFV      Full           Yes      Yes                                 +---------+---------------+---------+-----------+----------+--------------+ SFJ      Full                                                        +---------+---------------+---------+-----------+----------+--------------+ FV Prox  Full                                                        +---------+---------------+---------+-----------+----------+--------------+ FV Mid   Full                                                        +---------+---------------+---------+-----------+----------+--------------+ FV DistalFull                                                        +---------+---------------+---------+-----------+----------+--------------+ PFV      Full                                                        +---------+---------------+---------+-----------+----------+--------------+ POP      Full           Yes      Yes                                 +---------+---------------+---------+-----------+----------+--------------+ PTV      Full                                                        +---------+---------------+---------+-----------+----------+--------------+  PERO     Full                                                        +---------+---------------+---------+-----------+----------+--------------+     Summary: BILATERAL: - No evidence of deep vein thrombosis seen in the lower extremities, bilaterally.   *See table(s) above for measurements and observations. Electronically signed by Curt Jews MD on 08/08/2019 at 8:17:03 AM.    Final      PERTINENT LAB RESULTS: CBC: Recent Labs    08/24/19 0315 08/25/19 0629  WBC 7.2 8.1  HGB 10.3* 10.2*  HCT 31.4* 30.9*  PLT 178 174   CMET CMP     Component Value Date/Time   NA  138 08/25/2019 0629   K 3.5 08/25/2019 0629   CL 106 08/25/2019 0629   CO2 25 08/25/2019 0629   GLUCOSE 172 (H) 08/25/2019 0629   BUN 41 (H) 08/25/2019 0629   CREATININE 1.64 (H) 08/25/2019 0629   CALCIUM 8.0 (L) 08/25/2019 0629   PROT 4.6 (L) 08/24/2019 0315   ALBUMIN 1.8 (L) 08/24/2019 0315   AST 21 08/24/2019 0315   ALT 29 08/24/2019 0315   ALKPHOS 46 08/24/2019 0315   BILITOT 0.6 08/24/2019 0315   GFRNONAA 44 (L) 08/25/2019 0629   GFRAA 51 (L) 08/25/2019 0629    GFR Estimated Creatinine Clearance: 43.1 mL/min (A) (by C-G formula based on SCr of 1.64 mg/dL (H)). No results for input(s): LIPASE, AMYLASE in the last 72 hours. No results for input(s): CKTOTAL, CKMB, CKMBINDEX, TROPONINI in the last 72 hours. Invalid input(s): POCBNP No results for input(s): DDIMER in the last 72 hours. No results for input(s): HGBA1C in the last 72 hours. No results for input(s): CHOL, HDL, LDLCALC, TRIG, CHOLHDL, LDLDIRECT in the last 72 hours. Recent Labs    08/25/19 0629  TSH 1.622   Recent Labs    08/25/19 0629  VITAMINB12 555   Coags: No results for input(s): INR in the last 72 hours.  Invalid input(s): PT Microbiology: Recent Results (from the past 240 hour(s))  Blood Culture (routine x 2)     Status: None (Preliminary result)   Collection Time: 08/22/19  3:46 AM   Specimen: BLOOD RIGHT ARM  Result Value Ref Range Status   Specimen Description BLOOD RIGHT ARM  Final   Special Requests   Final    BOTTLES DRAWN AEROBIC AND ANAEROBIC Blood Culture adequate volume   Culture   Final    NO GROWTH 4 DAYS Performed at [facility], [address], [place], [place] [address]    Report Status PENDING  Incomplete  Urine culture     Status: None   Collection Time: 08/22/19  5:29 AM   Specimen: In/Out Cath Urine  Result Value Ref Range Status   Specimen Description IN/OUT CATH URINE  Final   Special Requests NONE  Final   Culture   Final    NO GROWTH Performed at [facility], [place] [address], [place], [place] [address]    Report Status 08/23/2019 FINAL  Final  Blood Culture (routine x 2)     Status: None (Preliminary result)   Collection Time: 08/22/19  7:10 AM   Specimen: BLOOD LEFT WRIST  Result Value Ref Range Status   Specimen Description BLOOD LEFT WRIST  Final   Special Requests   Final    BOTTLES DRAWN AEROBIC AND ANAEROBIC Blood Culture results may not be optimal due to an inadequate volume of blood received in culture bottles   Culture   Final    NO GROWTH 4 DAYS Performed at [facility], [place] [address] [place], [place] [address]    Report Status PENDING  Incomplete    FURTHER DISCHARGE INSTRUCTIONS:  Get Medicines reviewed and adjusted: Please take all your medications with you for your next visit with your Primary MD  Laboratory/radiological data: Please request your Primary MD to go over all hospital tests and procedure/radiological results at the follow up, please ask your Primary MD to get all Hospital records sent to his/her office.  In some cases, they will be blood work, cultures and biopsy results pending at the time of your discharge. Please request that your primary care M.D. goes through all the records of your hospital data and follows up on these results.  Also Note the following: If you experience worsening of your admission symptoms, develop shortness of breath, life threatening emergency, suicidal or homicidal thoughts you must seek medical attention immediately by calling 911 or calling your MD immediately  if symptoms less severe.  You must read complete instructions/literature along with all the possible adverse reactions/side effects for all the Medicines you take and that have been prescribed to you. Take any new Medicines after you have completely understood and accpet all the possible adverse reactions/side effects.   Do not drive when taking Pain medications or sleeping medications  (Benzodaizepines)  Do not take more than prescribed Pain, Sleep and Anxiety Medications. It is not advisable to combine anxiety,sleep and pain medications without talking with your primary care practitioner  Special Instructions: If you have smoked or chewed Tobacco  in the last 2 yrs please stop smoking, stop any regular Alcohol  and or any Recreational drug use.  Wear Seat belts while driving.  Please note: You were cared for by a hospitalist during your hospital stay. Once you are discharged, your primary care physician will handle any further medical issues. Please note that NO REFILLS for any discharge medications will be authorized once you are discharged, as it is imperative that you return to your primary care physician (or establish a relationship with a primary care physician if you do not have one) for your post hospital discharge needs so that they can reassess your need for medications and monitor your lab values.  Total Time spent coordinating discharge including counseling, education and face to face time equals 35 minutes.  SignedOren Binet 08/26/2019 10:36 AM

## 2019-08-26 NOTE — Progress Notes (Signed)
Report called to facility spoke with [place]. She has no questions at this time.

## 2019-08-26 NOTE — TOC Transition Note (Signed)
Transition of Care [facility]) - CM/SW Discharge Note   Patient Details  Name: Lucas Hobbs MRN: [id number] Date of Birth: 18-Jul-1955  [facility] CM/SW Contact:  Ralph Dowdy, [place] Work Phone Number: 08/26/2019, 11:35 AM   Clinical Narrative:    [facility]  Anticipated DC date: 08/26/2019  Family notified: Spouse/[name]  Transport by: Corey Harold        Per MD patient ready for DC to [place], room# 117 . RN, patient, patient's family, and facility notified of DC. Discharge Summary and FL2 sent to facility. RN to call report prior to discharge ([phone]). DC packet on chart. Ambulance transport requested for patient.     CSW will sign off for now as social work intervention is no longer needed. Please consult [place] again if new needs arise.    Final next level of care: Skilled Nursing Facility Barriers to Discharge: Insurance Authorization   Patient Goals and CMS Choice Patient states their goals for this hospitalization and ongoing recovery are:: Get stronger CMS Medicare.gov Compare Post Acute Care list provided to:: Patient Represenative (must comment)(Spouse) Choice offered to / list presented to : Spouse  Discharge Placement                       Discharge Plan and Services In-house Referral: Clinical Social Work   Post Acute Care Choice: [place]                               Social Determinants of Health (SDOH) Interventions     Readmission Risk Interventions Readmission Risk Prevention Plan 08/25/2019 08/17/2019  Transportation Screening Complete Complete  PCP or Specialist Appt within 3-5 Days - Complete  HRI or [place] - Complete  Social Work Consult for [place] Planning/Counseling - Complete  Palliative Care Screening - Not Applicable  Medication Review Press photographer) Complete Complete  PCP or Specialist appointment within 3-5 days of discharge  Complete -  [place] or Home Care Consult Complete -  SW Recovery Care/Counseling Consult Complete -  Palliative Care Screening Not Applicable -  [place] Complete -

## 2019-08-26 NOTE — TOC Progression Note (Addendum)
Transition of Care [facility]) - Progression Note    Patient Details  Name: Lucas Hobbs MRN: [id number] Date of Birth: 06/06/1955  Transition of Care [facility]) CM/SW [place], LCSW Phone Number: 08/26/2019, 8:51 AM  Clinical Narrative:    8:51am-[facility] has insurance approval for patient. His spouse is completing paperwork there at 11:30am. Per Facility, patient must be documented as off of isolation precautions and fever-free. Patient's spouse requesting to see patient prior to his discharge as SNF will not allow visitors.   11:36am-Per [facility] Liaison, patient's spouse will be allowed to visit with him once he arrives there before entering into his 14 day quarantine period so the hospital does not have to try to find an available room.    Expected Discharge Plan: Skilled Nursing Facility Barriers to Discharge: Insurance Authorization  Expected Discharge Plan and Services Expected Discharge Plan: [place] In-house Referral: Clinical Social Work   Post Acute Care Choice: [place] Living arrangements for the past 2 months: Single Family Home                                       Social Determinants of Health (SDOH) Interventions    Readmission Risk Interventions Readmission Risk Prevention Plan 08/25/2019 08/17/2019  Transportation Screening Complete Complete  PCP or Specialist Appt within 3-5 Days - Complete  HRI or [place] - Complete  Social Work Consult for [place] Planning/Counseling - Complete  Palliative Care Screening - Not Applicable  Medication Review Press photographer) Complete Complete  PCP or Specialist appointment within 3-5 days of discharge Complete -  [place] or Home Care Consult Complete -  SW Recovery Care/Counseling Consult Complete -  Palliative Care Screening Not Applicable -  [place] Complete -

## 2019-08-27 LAB — CULTURE, BLOOD (ROUTINE X 2)
Culture: NO GROWTH
Culture: NO GROWTH
Special Requests: ADEQUATE

## 2019-08-28 LAB — GLUCOSE, CAPILLARY: Glucose-Capillary: 36 mg/dL — CL (ref 70–99)

## 2020-06-29 IMAGING — MR MR HEAD W/O CM
12 of 13 series · 44 of 48 positions shown · non-contrast
Comparison: Head CT 08/06/2019.

CLINICAL DATA: 63-year-old male with subacute neurologic deficits.
Delirium earlier this month.

EXAM:
MRI HEAD WITHOUT CONTRAST
TECHNIQUE: Multiplanar, multiecho pulse sequences of the brain and surrounding
structures were obtained without intravenous contrast.

[Series 5: DWI · axial · 3.0mm · 0.88mm/px · z∈[-50,+92]mm · 8 of 100 slices shown (1 of 4)]
[im 1/100]
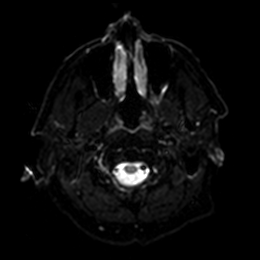
[im 15/100]
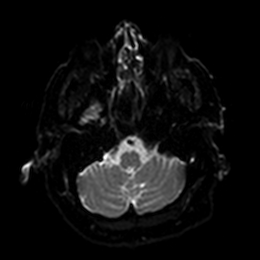
[im 29/100]
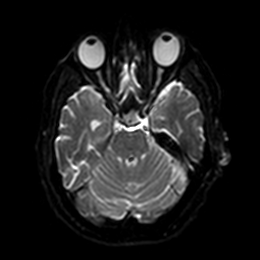
[im 43/100]
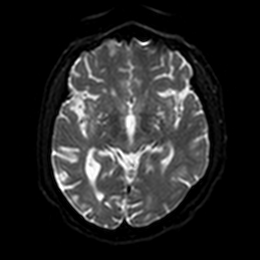
[im 57/100]
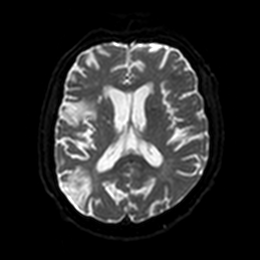
[im 71/100]
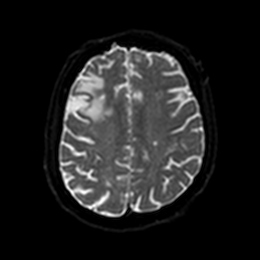
[im 85/100]
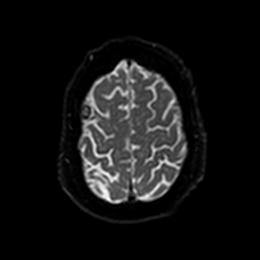
[im 100/100]
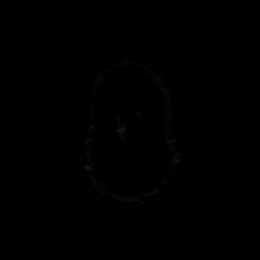

[Series 6: DWI · axial · 3.0mm · 0.88mm/px · z∈[-50,+92]mm · 4 of 50 slices shown (2 of 4)]
[im 1/50]
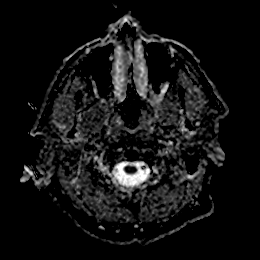
[im 17/50]
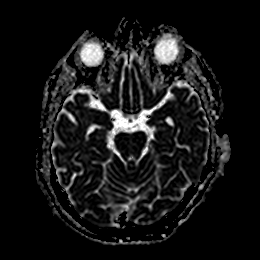
[im 33/50]
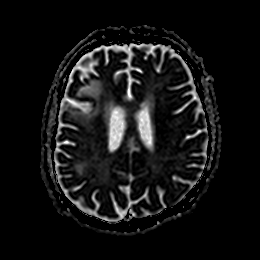
[im 50/50]
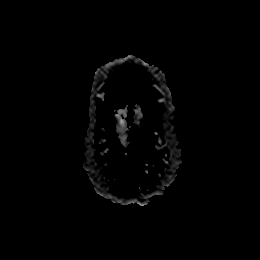

[Series 7: T1 · sagittal · 5.0mm · 0.75mm/px · 2 of 25 slices shown]
[im 1/25]
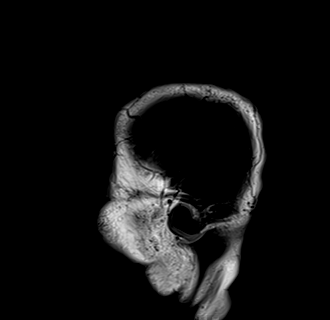
[im 25/25]
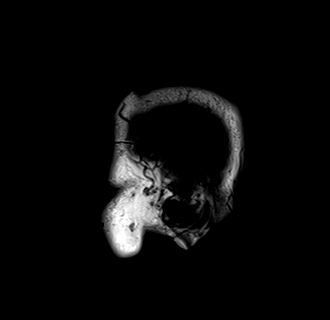

[Series 8: DWI · coronal · 4.0mm · 0.88mm/px · 5 of 66 slices shown (3 of 4)]
[im 1/66]
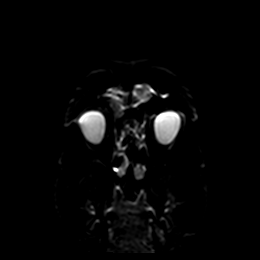
[im 17/66]
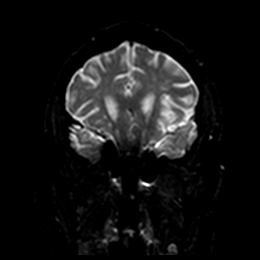
[im 33/66]
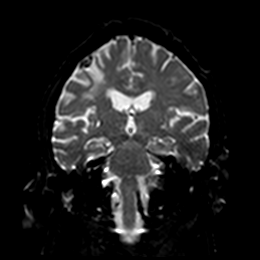
[im 49/66]
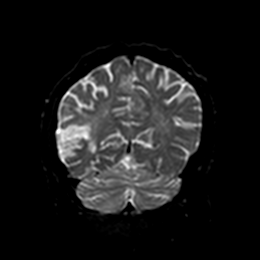
[im 66/66]
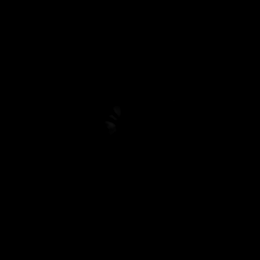

[Series 9: DWI · coronal · 4.0mm · 0.88mm/px · 3 of 33 slices shown (4 of 4)]
[im 1/33]
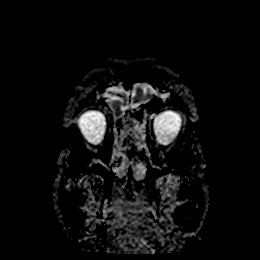
[im 17/33]
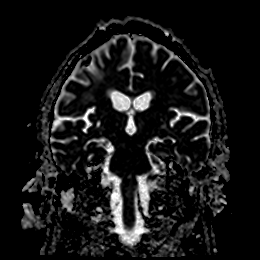
[im 33/33]
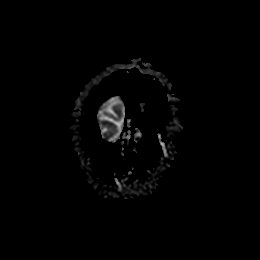

[Series 10: T2 · axial · 5.0mm · 0.72mm/px · z∈[-50,+94]mm · 2 of 26 slices shown (1 of 2)]
[im 1/26]
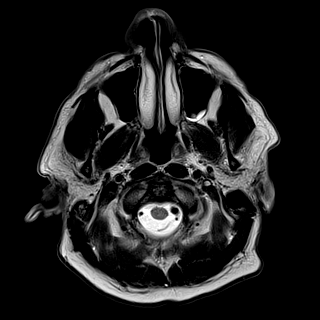
[im 26/26]
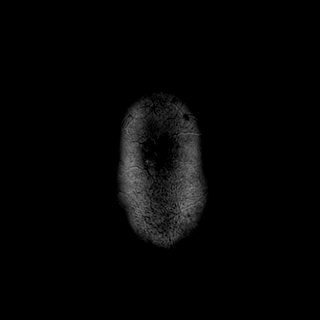

[Series 11: FLAIR · axial · 5.0mm · 0.45mm/px · z∈[-48,+96]mm · 2 of 26 slices shown]
[im 1/26]
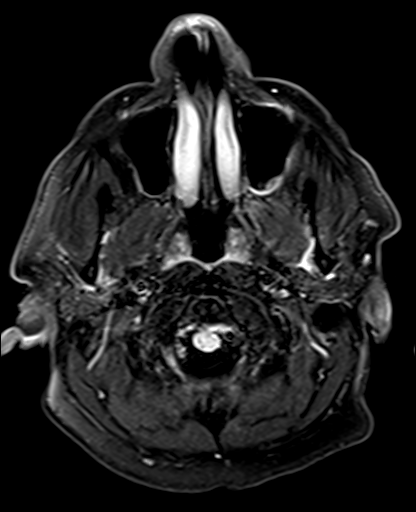
[im 26/26]
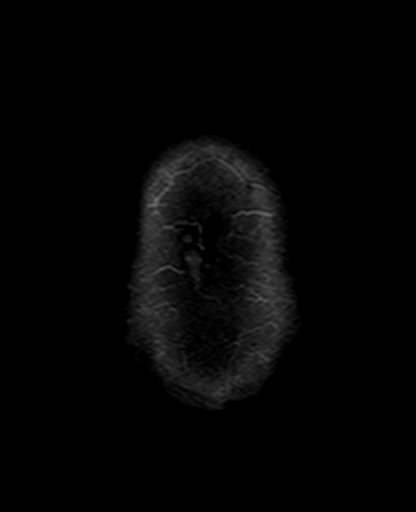

[Series 12: mag_images · axial · 3.0mm · 0.90mm/px · z∈[-67,+95]mm · 4 of 56 slices shown]
[im 1/56]
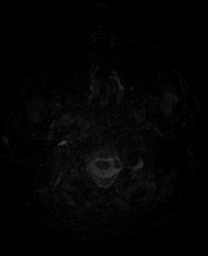
[im 19/56]
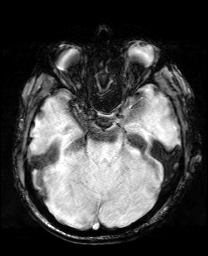
[im 37/56]
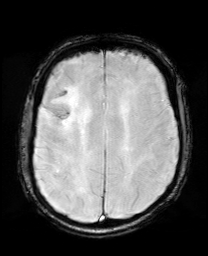
[im 56/56]
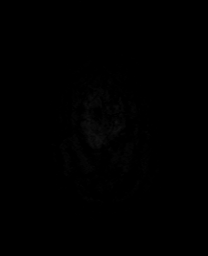

[Series 13: pha_images · axial · 3.0mm · 0.90mm/px · z∈[-67,+95]mm · 4 of 56 slices shown]
[im 1/56]
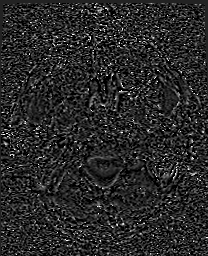
[im 19/56]
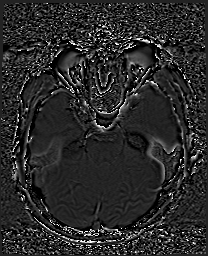
[im 37/56]
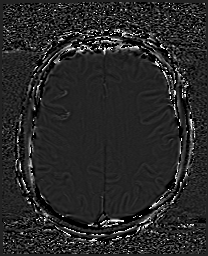
[im 56/56]
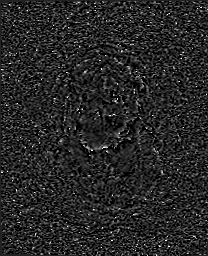

[Series 14: swi_images · axial · 3.0mm · 0.90mm/px · z∈[-67,+95]mm · 4 of 56 slices shown]
[im 1/56]
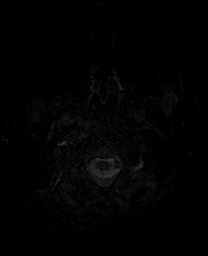
[im 19/56]
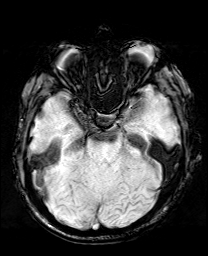
[im 37/56]
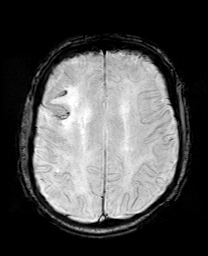
[im 56/56]
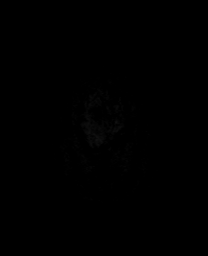

[Series 15: mip_images(sw) · axial · 24.0mm · 0.90mm/px · z∈[-57,+84]mm · 4 of 49 slices shown]
[im 1/49]
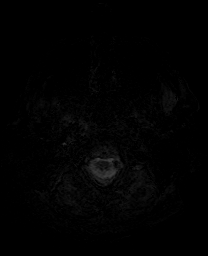
[im 17/49]
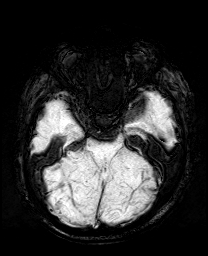
[im 33/49]
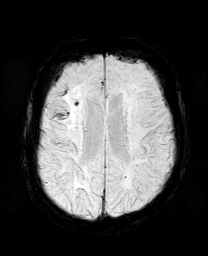
[im 49/49]
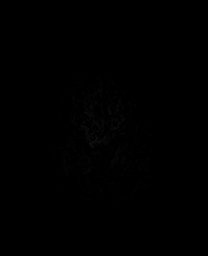

[Series 17: T2 · coronal · 5.0mm · 0.34mm/px · 2 of 31 slices shown (2 of 2)]
[im 1/31]
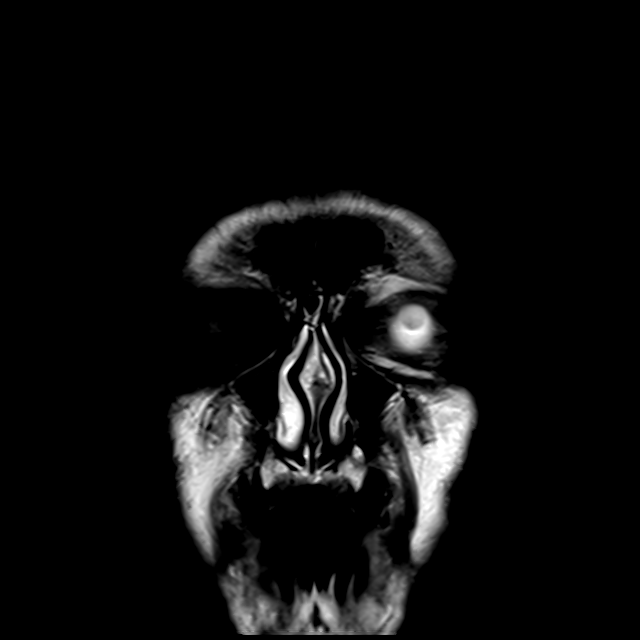
[im 31/31]
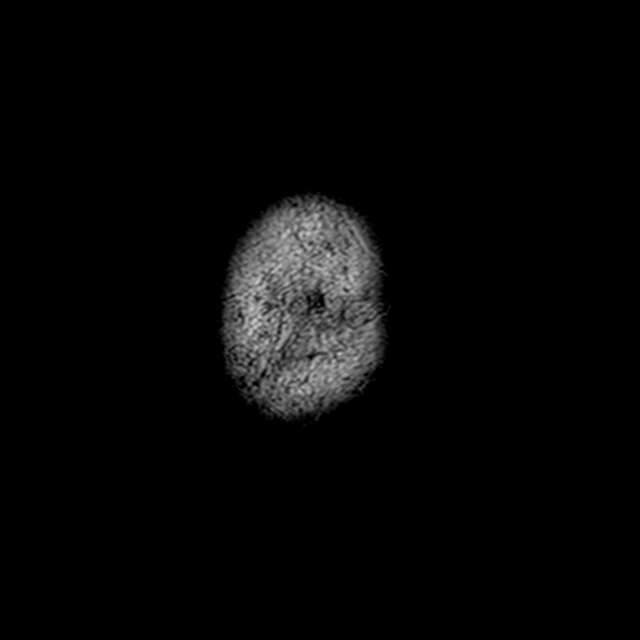

[44 of 48 positions shown; findings below may reference images not displayed]

FINDINGS: Brain: Multifocal right hemisphere developing encephalomalacia with
areas of laminar necrosis and hemosiderin. These are within the
right MCA territory and correspond to the abnormal hypodensity on CT
earlier this month. None of these areas is definitely restricted on
diffusion.

No restricted diffusion to suggest acute infarction. No midline
shift, mass effect, evidence of mass lesion, ventriculomegaly,
extra-axial collection or acute intracranial hemorrhage.
Cervicomedullary junction and pituitary are within normal limits.

Outside of the right MCA territory there is patchy bilateral
cerebral white matter T2 and FLAIR hyperintensity, and occasional
chronic micro hemorrhages (posterior left centrum semiovale series
14, image 40). But no other cortical encephalomalacia. However,
there is evidence of chronic lacunar infarcts in the right basal
ganglia and thalamus. Brainstem and cerebellum appear negative.

Vascular: Major intracranial vascular flow voids are preserved, the
left vertebral artery appears dominant.

Skull and upper cervical spine: Negative visible cervical spine and
bone marrow signal.

Sinuses/Orbits: Negative orbits. Trace paranasal sinus mucosal
thickening.

Other: Trace bilateral mastoid air cell fluid, probably
postinflammatory with negative visible nasopharynx. Other visible
internal auditory structures appear normal. Negative scalp and face
soft tissues.
IMPRESSION: 1. No acute intracranial abnormality.
2. Late subacute to early chronic multifocal infarcts the Right MCA
territory corresponding to the recent CT finding. Associated laminar
necrosis, hemosiderin, and developing encephalomalacia.
3. Superimposed chronic small vessel disease in the right deep gray
nuclei and bilateral white matter with occasional other chronic
microhemorrhages.

## 2022-03-21 ENCOUNTER — Encounter: Admit: 2022-03-21 | Discharge: 2022-03-21

## 2022-03-21 NOTE — Telephone Encounter
Called Dr. Marko Plume office, but had to leave voice message to let them know we needed the actual images from this patients carotid angiogram to get this patient scheduled. That Dr. Darin Engels is waiting to review them.  I have let them know this was an urgent referral from Dr. Wynona Canes and that we previously requested the imaging, but have not received it.  Requested a call back.

## 2022-03-27 ENCOUNTER — Encounter: Admit: 2022-03-27 | Discharge: 2022-03-27

## 2022-03-27 NOTE — Telephone Encounter
Called Dr. Janie Morning nurse Abby and left third message that we needed patient's actual imaging clouded to [facility].  That Dr. Darin Engels is unable to schedule this urgent referral until he has that.  Requested they please send it today.  Then placed a call to patient.  His wife answered the phone and said he was sleeping, but that she would call Dr. Janie Morning office this morning.  That all his imaging was done in their office and that the area is attached and doesn't have a different name.  That if she can't get it clouded she will go to [facility] Monday and try to pick up a disc.

## 2022-04-04 ENCOUNTER — Encounter: Admit: 2022-04-04 | Discharge: 2022-04-04

## 2022-04-04 NOTE — Telephone Encounter
Called and let Mrs. Chaney know we had received her husband's imaging discs from Dr. Janie Morning office.  Disc was given to Dr. Darin Engels today for review and patient scheduling.  Let Mrs. Erven know someone should be calling to get them scheduled soon.  Provided office number in case they did not hear anything by mid next week so that I can follow up on it.  Mr. Selner has an appointment with Dr. Wynona Canes tomorrow.

## 2022-04-08 ENCOUNTER — Encounter: Admit: 2022-04-08 | Discharge: 2022-04-08

## 2022-04-08 DIAGNOSIS — I6529 Occlusion and stenosis of unspecified carotid artery: Secondary | ICD-10-CM

## 2022-04-15 ENCOUNTER — Encounter: Admit: 2022-04-15 | Discharge: 2022-04-15

## 2022-04-15 NOTE — Telephone Encounter
Patient's wife called early this morning and said he would be finishing up his Eliquis on Wednesday of this week, December 13th.  She wanted to know if he was going to be scheduled for procedure or to be seen soon?  Routing to Dr. Darin Engels and Mardella Layman.

## 2022-04-25 ENCOUNTER — Encounter: Admit: 2022-04-25 | Discharge: 2022-04-25

## 2022-04-25 NOTE — Telephone Encounter
Kyle Powell calls today to update Kyle Powell that Kyle Powell woke up feeling sick today and couldn't make the trip to his urology appointment.  She said they are going to call her at 1:00 PM and do a telehealth visit with him.  His urologist understands we are waiting to see if we can schedule him for IR.  Any labs that are ordered she will have him do locally and update Kyle Powell on when/where results are so we can obtain them.  Routing to Dr. Darin Engels.

## 2022-04-26 ENCOUNTER — Encounter: Admit: 2022-04-26 | Discharge: 2022-04-26

## 2022-04-26 NOTE — Telephone Encounter
Received copy of patient's Nephrology telehealth visit from 04-25-22 with Dr. Lorre Munroe.  Scanned to Dr. Sherran Needs email for review and scheduling consideration.

## 2022-05-07 ENCOUNTER — Encounter: Admit: 2022-05-07 | Discharge: 2022-05-07

## 2022-05-07 NOTE — Telephone Encounter
Patient's wife calls, stating that [facility] physician's office notified her they had faxed their office notes to Dr. Tammi Klippel and she was asking when he could be scheduled.  I let her know we had received the records and that I had scanned them to Dr. Marilynne Drivers email.  Also that Dr. Tammi Klippel had reviewed them and that he had spoken with [facility]' physician.  That Dr. Tammi Klippel was checking with a [facility] surgeon that does Carotid Endarterectomy to discuss Asaph case and come to consensus on the best way to move forward with his care.  I let her know I would be back in touch with her as soon as I knew.  Routing to Dr. Tammi Klippel.

## 2022-05-09 ENCOUNTER — Encounter: Admit: 2022-05-09 | Discharge: 2022-05-09

## 2022-05-09 NOTE — Telephone Encounter
Late entry:  called Kyle Powell this morning and let her know Dr. Tammi Klippel had talked with his colleague and that the consensus was to utilize Interventional Radiology over CEA surgery.  That the next call she receives should be from the IR nursing or scheduling staff to get the procedure set up for him.  She thanked me for calling.

## 2022-05-13 ENCOUNTER — Encounter: Admit: 2022-05-13 | Discharge: 2022-05-13

## 2022-05-22 ENCOUNTER — Encounter: Admit: 2022-05-22 | Discharge: 2022-05-22

## 2022-05-31 ENCOUNTER — Encounter: Admit: 2022-05-31 | Discharge: 2022-05-31

## 2022-05-31 DIAGNOSIS — I10 Essential (primary) hypertension: Secondary | ICD-10-CM

## 2022-05-31 DIAGNOSIS — Z950 Presence of cardiac pacemaker: Secondary | ICD-10-CM

## 2022-05-31 DIAGNOSIS — I6521 Occlusion and stenosis of right carotid artery: Secondary | ICD-10-CM

## 2022-05-31 DIAGNOSIS — E119 Type 2 diabetes mellitus without complications: Secondary | ICD-10-CM

## 2022-05-31 DIAGNOSIS — R0602 Shortness of breath: Secondary | ICD-10-CM

## 2022-05-31 DIAGNOSIS — E785 Hyperlipidemia, unspecified: Secondary | ICD-10-CM

## 2022-05-31 DIAGNOSIS — I509 Heart failure, unspecified: Secondary | ICD-10-CM

## 2022-05-31 DIAGNOSIS — N184 Chronic kidney disease, stage 4 (severe): Secondary | ICD-10-CM

## 2022-05-31 DIAGNOSIS — G459 Transient cerebral ischemic attack, unspecified: Secondary | ICD-10-CM

## 2022-05-31 MED ORDER — PREDNISONE 50 MG PO TAB
50 mg | ORAL_TABLET | Freq: Every day | ORAL | 0 refills | Status: AC
Start: 2022-05-31 — End: ?

## 2022-05-31 MED ORDER — CLOPIDOGREL 75 MG PO TAB
75 mg | ORAL_TABLET | Freq: Every day | ORAL | 3 refills | 90.00000 days | Status: AC
Start: 2022-05-31 — End: ?

## 2022-05-31 MED ORDER — ASPIRIN 325 MG PO TAB
325 mg | ORAL_TABLET | Freq: Every day | ORAL | 3 refills | 30.00000 days | Status: AC
Start: 2022-05-31 — End: ?

## 2022-06-05 ENCOUNTER — Encounter: Admit: 2022-06-05 | Discharge: 2022-06-05 | Payer: MEDICARE

## 2022-06-11 ENCOUNTER — Encounter: Admit: 2022-06-11 | Discharge: 2022-06-11 | Payer: MEDICARE

## 2022-06-12 ENCOUNTER — Encounter: Admit: 2022-06-12 | Discharge: 2022-06-12 | Payer: MEDICARE

## 2022-06-20 ENCOUNTER — Encounter: Admit: 2022-06-20 | Discharge: 2022-06-20 | Payer: MEDICARE

## 2022-06-21 ENCOUNTER — Ambulatory Visit: Admit: 2022-06-21 | Discharge: 2022-06-22 | Payer: MEDICARE

## 2022-06-21 ENCOUNTER — Encounter: Admit: 2022-06-21 | Discharge: 2022-06-21 | Payer: MEDICARE

## 2022-06-21 DIAGNOSIS — I2510 Atherosclerotic heart disease of native coronary artery without angina pectoris: Secondary | ICD-10-CM

## 2022-06-21 DIAGNOSIS — I10 Essential (primary) hypertension: Secondary | ICD-10-CM

## 2022-06-21 DIAGNOSIS — Z01818 Encounter for other preprocedural examination: Secondary | ICD-10-CM

## 2022-06-21 DIAGNOSIS — I509 Heart failure, unspecified: Secondary | ICD-10-CM

## 2022-06-21 DIAGNOSIS — R0602 Shortness of breath: Secondary | ICD-10-CM

## 2022-06-21 DIAGNOSIS — E119 Type 2 diabetes mellitus without complications: Secondary | ICD-10-CM

## 2022-06-21 DIAGNOSIS — G459 Transient cerebral ischemic attack, unspecified: Secondary | ICD-10-CM

## 2022-06-21 DIAGNOSIS — I639 Cerebral infarction, unspecified: Secondary | ICD-10-CM

## 2022-06-21 DIAGNOSIS — E785 Hyperlipidemia, unspecified: Secondary | ICD-10-CM

## 2022-06-21 DIAGNOSIS — Z950 Presence of cardiac pacemaker: Secondary | ICD-10-CM

## 2022-06-21 DIAGNOSIS — N184 Chronic kidney disease, stage 4 (severe): Secondary | ICD-10-CM

## 2022-06-21 NOTE — Unmapped
PAC Anesthesia Pre-Procedure Evaluation    Name: Kyle Powell.      MRN: [id number]     DOB: 1955-08-10     Age: 67 y.o.     Sex: male   _________________________________________________________________________     Procedure Info:   Procedure Information       Date/Time: 07/09/22 1100    Procedure: IR CAROTID ARTERIOGRAM WITH INTERVENTION    Location: Interventional Radiology: [facility]            Physical Assessment  Vital Signs (last filed in past 24 hours):         Patient History   Allergies   Allergen Reactions    Iodine HIVES        Current Medications    Medication Directions   aspirin 325 mg tablet Take one tablet by mouth daily.   clopiDOGreL (PLAVIX) 75 mg tablet Take one tablet by mouth daily.   predniSONE (DELTASONE) 50 mg tablet Take one tablet by mouth daily. Take 1 tab PO 3/4 at 2300 and 1 tab PO 3/5 at 0900 for contrast allergy       Review of Systems/Medical History      Patient summary reviewed  Pertinent labs reviewed    PONV Screening: Non-smoker    No history of anesthetic complications    No family history of anesthetic complications      Airway - negative        Pulmonary       Not a current smoker          Recent URI (05/22/22 [facility] pna with admission ([facility])- d/c with abx, steriods. concurrent CHF exacerbation s/p diuresis. pt states resolution of symptoms.)        No Obstructive Sleep Apnea      05/1722 admission [facility] for bilateral lower lobe pneumonia, CHF exacerbation, acute respiratory failure with hypoxia      Cardiovascular       Recent diagnostic studies:          ECG and echocardiogram      Exercise tolerance: >4 METS       Beta Blocker therapy: Yes        CIED: pacemaker          Manufacture: [employer]                                        CIED interrogated last 12 months                                              Hypertension, well controlled          Valvular problems/murmurs (Moderate MR and TR, mild MS):          Coronary artery disease      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 Lasix is used prn for swelling, weight) Chronic      Hyperlipidemia      Orthopnea      No dyspnea on exertion (no current DOE per pt)  Cardiologist: Dr. 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, 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):      Constitution - negative    IV start on right side   Physical Exam    Airway Findings        Neck ROM: full      Mouth opening: good      Upper Lip Bite Test: 1      Airway patency: adequate    Dental Findings: Negative      Neurological Findings:       Alert and oriented x 3    Constitutional findings:       No acute distress      Well-developed       Diagnostic Tests  Hematology: No results found for: HGB, HCT, PLTCT, WBC, NEUT, ANC, LYMPH, ALC, ABSLYMPHCT, MONA, AMC, EOSA, ABC, BASOPHILS, MCV, MCH, MCHC, MPV, RDW      General Chemistry: No results found for: NA, K, CL, CO2, GAP, BUN, CR, GLU, CA, KETONES, ALBUMIN, LACTIC, OBSCA, MG, TOTBILI, TOTBILCB, PO4   Coagulation: No results found for: PT, PTT, INR          05/22/22 EKG: NSR. Rate 72 old anterior infarct. Probable LAE    05/22/22 CXR ([facility])  FINDINGS: There is a left-sided pacemaker device in place. Cardiac bypass surgery is noted.   The heart size is normal.   The aorta is sclerotic.   There is no hilar or mediastinal mass.   Bibasilar infiltrates are present, right greater the left.   Pleural effusions are also noted bilaterally, right greater the left. The upper lung fields are clear and congestive heart failure is not suggested. There is no pneumothorax.   There are no significant osseous abnormalities.       05/23/22 Echo ([facility])  Conclusions:   ? The left ventricular systolic function is normal.   ? The visually estimated ejection fraction is between 55-60%.   ? Evidence suggests grade II (moderate) diastolic dysfunction.   ? The calculated ejection fraction is 58% by biplane method.   ? Mild concentric left ventricular hypertrophy.   ? Normal right ventricular cavity size.  The right ventricular   systolic function is normal.   ? ICD/pacemaker wire visualized in right-sided chambers.   ? Aortic sclerosis without stenosis. Moderate mitral and moderate   tricuspid regurgitation noted.  Mild mitral stenosis with mean   transmitral gradient of 4 mmHg at heart rate of 80 bpm noted.  No   other hemodynamically significant valvular abnormality noted.   ?  Right ventricular systolic pressure is mildly elevated and   measuring up to 47 mmHg.   ? There is no evidence of pericardial effusion.         PAC Plan    Interview: Telehealth Interview    ASA Score: 3          Prior Records Requested: ([facility] Vascular in [address])  Stress Test, Office Visit and Interrogation Report      Labs/Studies Ordered Prior to DOS: (CBC, BMP ordered by Renette Butters, to be drawn at [facility] lab [place] on Monday 2/19)            PAC RISK ASSESSMENTS:   *Duke Activity Status Index (DASI): 39.45, Calculated METS: 7.59 (score < 25 correlates with increased risk for death, MI, and moderate to severe complications, max score 57)  *Revised Cardiac Risk Index (RCRI): 3-6 points = 11% risk for major adverse cardiac events (myocardial infarction, pulmonary edema, cardiac arrest) (CAD, CHF, CVA, CKD)  *STOP-BANG Score: 4 (total 5-8 high risk for OSA, 3-4 with HCO3 >28 also high risk. OSA associated with greater than twice the odds for respiratory failure, cardiac events, ICU admission, and difficult intubation)  *Clinical Frailty Score:  3 (score >3 [of 9] increased risk for death, post-op delirium, and discharge to specialty facility)                  CIED

## 2022-06-25 ENCOUNTER — Encounter: Admit: 2022-06-25 | Discharge: 2022-06-25 | Payer: MEDICARE

## 2022-07-05 ENCOUNTER — Encounter: Admit: 2022-07-05 | Discharge: 2022-07-05 | Payer: MEDICARE

## 2022-07-05 NOTE — Telephone Encounter
Spoke to pt's spouse Stanton Kidney regarding Dr Marilynne Drivers recommendations of staying hydrated and ER precautions. Dr Tammi Klippel said they can call Monday if the prednisone is causing issues.    Stanton Kidney said that she is thinking about taking Thomasjames to the ER because he is continuing to vomit and he's shaking and cannot keep fluids down. She also discussed this with his PCP who recommended he go to the ER. I told Stanton Kidney that he needs to go to the ER right away. I asked Stanton Kidney to call us on Monday with an update. Mary verbalized understanding and agreement.

## 2022-07-05 NOTE — Telephone Encounter
Pt's spouse Kyle Powell called stating that pt started vomiting this morning, as soon as he got up this morning she gave him one of his nausea pills and it helped, but just now she had to pull over while driving so he could vomit.   Pt doesn't have any dizziness, abdominal pain, headache or vision changes.   Kyle Powell is requesting Dr Marilynne Drivers recommendations because he is supposed to have surgery on Tuesday and she needs to know if there's something they need to do? He is supposed to take prednisone on Monday to prepare for surgery and sometimes that makes him sick.   Karma Ganja that I will forward her message to Dr Tammi Klippel for recommendations and that he needs to stay hydrated and that if he cannot keep fluids down or if vomiting is nonstop then he needs to go to an urgent care or emergency room.

## 2022-07-08 ENCOUNTER — Encounter: Admit: 2022-07-08 | Discharge: 2022-07-08 | Payer: MEDICARE

## 2022-07-08 NOTE — Progress Notes
Dear Kyle Powell     Thank you for choosing [facility] Interventional Radiology for your procedure. Your appointment information is listed below:     You are scheduled for a carotid stent with Dr Darin Engels on 4/10 at 1000 AM.   Please check into admission of the [facility] at 0830am you will be admitted after the procedure.  [employer]: [address] [place], [place]  [address]  Parking: [employer]        INTERVENTIONAL RADIOLOGY  PRE-PROCEDURE INSTRUCTIONS SEDATION     You are scheduled for a procedure in Interventional Radiology with procedural sedation.  Please follow these instructions and any direction from your Primary Care/Managing Physician.  If you have questions about your procedure or need to reschedule please call [phone].     Medication Instructions:   You may take your medications with a sip of water. Please take both ASA and Plavix the morning of the procedure with a small sip of water. Also take prednisone 12 hours and 2 hours prior to the procedure.     Diet Instructions:  At 2 AM on 4/10, (8) hours before your procedure, stop your regular diet and start a clear liquid diet.  At 4 AMon 4/10, (6) hours before your procedure, discontinue tube feedings and chewing tobacco.  At 8 AM on 4/10, (2) hours before your procedure discontinue clear liquids.  You should have nothing by mouth. This includes GUM or CANDY.      Clear Liquid Diet     Water                 Apple or White Grape Juice        Coffee or tea without cream   Tea                    White Cranberry Juice                 Chicken Bouillon or Broth (no noodles)  Soda Pop           Popsicles                                     Beef Bouillon or Broth (no noodles)     Day of Exam Instructions:  Bathe or shower with an antibacterial soap prior to your appointment.  If you have a history of Obstructive Sleep Apnea (OSA) bring your CPAP/BIPAP.   Bring a list of your current medications and the dosages.  Wear comfortable clothing and leave valuables at home.  Arrive (1) hour prior to your appointment.  This time will be spent registering, interviewing, assessing, educating and preparing you for the test.  You will be with [place] anywhere from 30 minutes to 6 hours after your exam depending on your procedure.  You may be sedated for the procedure. A responsible adult must drive you home (no Benedetto Goad, taxis or buses are allowed) and stay with you overnight. If you do not have a driver we will be unable to perform your procedure.   You will not be able to return to work or drive the same day if receiving sedation.

## 2022-07-09 ENCOUNTER — Encounter: Admit: 2022-07-09 | Discharge: 2022-07-09 | Payer: MEDICARE

## 2022-07-24 ENCOUNTER — Encounter: Admit: 2022-07-24 | Discharge: 2022-07-24 | Payer: MEDICARE

## 2022-08-01 ENCOUNTER — Encounter: Admit: 2022-08-01 | Discharge: 2022-08-01 | Payer: MEDICARE

## 2022-08-02 ENCOUNTER — Encounter: Admit: 2022-08-02 | Discharge: 2022-08-02 | Payer: MEDICARE

## 2022-08-14 ENCOUNTER — Encounter: Admit: 2022-08-14 | Discharge: 2022-08-14 | Payer: MEDICARE

## 2022-08-14 ENCOUNTER — Inpatient Hospital Stay: Admit: 2022-08-14 | Payer: MEDICARE

## 2022-08-14 ENCOUNTER — Ambulatory Visit: Admit: 2022-08-14 | Discharge: 2022-08-14 | Payer: MEDICARE

## 2022-08-14 DIAGNOSIS — I509 Heart failure, unspecified: Secondary | ICD-10-CM

## 2022-08-14 DIAGNOSIS — E119 Type 2 diabetes mellitus without complications: Secondary | ICD-10-CM

## 2022-08-14 DIAGNOSIS — R0602 Shortness of breath: Secondary | ICD-10-CM

## 2022-08-14 DIAGNOSIS — G459 Transient cerebral ischemic attack, unspecified: Secondary | ICD-10-CM

## 2022-08-14 DIAGNOSIS — N184 Chronic kidney disease, stage 4 (severe): Secondary | ICD-10-CM

## 2022-08-14 DIAGNOSIS — I6521 Occlusion and stenosis of right carotid artery: Secondary | ICD-10-CM

## 2022-08-14 DIAGNOSIS — I10 Essential (primary) hypertension: Secondary | ICD-10-CM

## 2022-08-14 DIAGNOSIS — I639 Cerebral infarction, unspecified: Secondary | ICD-10-CM

## 2022-08-14 DIAGNOSIS — E785 Hyperlipidemia, unspecified: Secondary | ICD-10-CM

## 2022-08-14 DIAGNOSIS — I2510 Atherosclerotic heart disease of native coronary artery without angina pectoris: Secondary | ICD-10-CM

## 2022-08-14 DIAGNOSIS — I6529 Occlusion and stenosis of unspecified carotid artery: Secondary | ICD-10-CM

## 2022-08-14 DIAGNOSIS — Z950 Presence of cardiac pacemaker: Secondary | ICD-10-CM

## 2022-08-14 LAB — CBC
HEMOGLOBIN: 10 g/dL — ABNORMAL LOW (ref 13.5–16.5)
MCHC: 32 g/dL — ABNORMAL HIGH (ref 32.0–36.0)
WBC COUNT: 10 K/UL (ref 4.5–11.0)

## 2022-08-14 LAB — URINALYSIS DIPSTICK REFLEX TO CULTURE
LEUKOCYTES: NEGATIVE
NITRITE: NEGATIVE
URINE ASCORBIC ACID, UA: NEGATIVE
URINE BILE: NEGATIVE

## 2022-08-14 LAB — BASIC METABOLIC PANEL
BLD UREA NITROGEN: 36 mg/dL — ABNORMAL HIGH (ref 7–25)
CALCIUM: 8.6 mg/dL (ref 8.5–10.6)
CO2: 20 MMOL/L — ABNORMAL LOW (ref 21–30)
CREATININE: 1.8 mg/dL — ABNORMAL HIGH (ref 0.4–1.24)
EGFR: 39 mL/min — ABNORMAL LOW (ref >60)
SODIUM: 144 MMOL/L — ABNORMAL LOW (ref 137–147)

## 2022-08-14 LAB — POC GLUCOSE
POC GLUCOSE: 152 mg/dL — ABNORMAL HIGH (ref 70–100)
POC GLUCOSE: 213 mg/dL — ABNORMAL HIGH (ref 70–100)
POC GLUCOSE: 272 mg/dL — ABNORMAL HIGH (ref 70–100)

## 2022-08-14 LAB — PHOSPHORUS: PHOSPHORUS: 3.4 mg/dL — ABNORMAL HIGH (ref 2.0–4.5)

## 2022-08-14 LAB — URINALYSIS MICROSCOPIC REFLEX TO CULTURE

## 2022-08-14 LAB — POC ACTIVATED CLOTTING TIME
ACTIVATED CLOTTING: 149 s
ACTIVATED CLOTTING: 239 s

## 2022-08-14 LAB — HEPARIN ASSAY (UNFRACTIONATED): HEPARIN ASSAY: 1.3 [IU]/mL — ABNORMAL HIGH (ref 0.30–0.70)

## 2022-08-14 LAB — PLATELET P2Y12 RESPONSE: PLATELET P2Y12 RESPONSE: 311 [PRU] (ref 180–335)

## 2022-08-14 LAB — MAGNESIUM: MAGNESIUM: 1.9 mg/dL — ABNORMAL HIGH (ref 1.6–2.6)

## 2022-08-14 MED ORDER — ATORVASTATIN 40 MG PO TAB
40 mg | Freq: Every evening | ORAL | 0 refills | Status: AC
Start: 2022-08-14 — End: ?

## 2022-08-14 MED ORDER — LISINOPRIL 10 MG PO TAB
10 mg | Freq: Every day | ORAL | 0 refills | Status: DC
Start: 2022-08-14 — End: 2022-08-15

## 2022-08-14 MED ORDER — IOHEXOL 300 MG IODINE/ML IV SOLN
70 mL | Freq: Once | INTRA_ARTERIAL | 0 refills | Status: CP
Start: 2022-08-14 — End: ?
  Administered 2022-08-14: 19:00:00 70 mL via INTRA_ARTERIAL

## 2022-08-14 MED ORDER — HYDRALAZINE 20 MG/ML IJ SOLN
10 mg | INTRAVENOUS | 0 refills | Status: DC | PRN
Start: 2022-08-14 — End: 2022-08-14

## 2022-08-14 MED ORDER — INSULIN GLARGINE 100 UNIT/ML (3 ML) SC INJ PEN
6 [IU] | Freq: Every evening | SUBCUTANEOUS | 0 refills | Status: AC
Start: 2022-08-14 — End: ?
  Administered 2022-08-15: 03:00:00 6 [IU] via SUBCUTANEOUS

## 2022-08-14 MED ORDER — LABETALOL 5 MG/ML IV SYRG
10 mg | INTRAVENOUS | 0 refills | Status: AC | PRN
Start: 2022-08-14 — End: ?
  Administered 2022-08-15 (×4): 10 mg via INTRAVENOUS

## 2022-08-14 MED ORDER — DIPHENHYDRAMINE HCL 50 MG/ML IJ SOLN
50 mg | Freq: Once | INTRAVENOUS | 0 refills | Status: CP
Start: 2022-08-14 — End: ?
  Administered 2022-08-14: 16:00:00 50 mg via INTRAVENOUS

## 2022-08-14 MED ORDER — NICARDIPINE IN NACL (ISO-OS) 20 MG/200 ML (0.1 MG/ML) IV PGBK
5-15 mg/h | INTRAVENOUS | 0 refills | Status: AC
Start: 2022-08-14 — End: ?

## 2022-08-14 MED ORDER — TICAGRELOR 90 MG PO TAB
180 mg | Freq: Once | ORAL | 0 refills | Status: CP
Start: 2022-08-14 — End: ?
  Administered 2022-08-14: 15:00:00 180 mg via ORAL

## 2022-08-14 MED ORDER — ASPIRIN 81 MG PO TBEC
81 mg | Freq: Every day | ORAL | 0 refills | Status: AC
Start: 2022-08-14 — End: ?
  Administered 2022-08-15: 15:00:00 81 mg via ORAL

## 2022-08-14 MED ORDER — ACETAMINOPHEN 325 MG PO TAB
650 mg | ORAL | 0 refills | Status: AC | PRN
Start: 2022-08-14 — End: ?

## 2022-08-14 MED ORDER — IMS MIXTURE TEMPLATE
18.75 mg | Freq: Two times a day (BID) | ORAL | 0 refills | Status: DC | PRN
Start: 2022-08-14 — End: 2022-08-15

## 2022-08-14 MED ORDER — CLOPIDOGREL 75 MG PO TAB
75 mg | Freq: Every day | ORAL | 0 refills | Status: DC
Start: 2022-08-14 — End: 2022-08-14

## 2022-08-14 MED ORDER — CEFTRIAXONE INJ 1GM IVP
1 g | INTRAVENOUS | 0 refills | Status: AC
Start: 2022-08-14 — End: ?
  Administered 2022-08-15: 02:00:00 1 g via INTRAVENOUS

## 2022-08-14 MED ORDER — CARVEDILOL 12.5 MG PO TAB
12.5 mg | Freq: Two times a day (BID) | ORAL | 0 refills | Status: AC
Start: 2022-08-14 — End: ?
  Administered 2022-08-15 (×2): 12.5 mg via ORAL

## 2022-08-14 MED ORDER — LISINOPRIL 10 MG PO TAB
10 mg | Freq: Every day | ORAL | 0 refills | Status: AC
Start: 2022-08-14 — End: ?
  Administered 2022-08-15: 15:00:00 10 mg via ORAL

## 2022-08-14 MED ORDER — DOCUSATE SODIUM 100 MG PO CAP
100 mg | Freq: Two times a day (BID) | ORAL | 0 refills | Status: DC
Start: 2022-08-14 — End: 2022-08-14

## 2022-08-14 MED ORDER — TICAGRELOR 90 MG PO TAB
90 mg | Freq: Two times a day (BID) | ORAL | 0 refills | Status: AC
Start: 2022-08-14 — End: ?
  Administered 2022-08-15 (×2): 90 mg via ORAL

## 2022-08-14 MED ORDER — LABETALOL 5 MG/ML IV SYRG
10 mg | INTRAVENOUS | 0 refills | Status: DC | PRN
Start: 2022-08-14 — End: 2022-08-14

## 2022-08-14 MED ORDER — ONDANSETRON HCL (PF) 4 MG/2 ML IJ SOLN
4 mg | INTRAVENOUS | 0 refills | Status: AC | PRN
Start: 2022-08-14 — End: ?

## 2022-08-14 MED ORDER — HYDRALAZINE 20 MG/ML IJ SOLN
10 mg | INTRAVENOUS | 0 refills | Status: AC | PRN
Start: 2022-08-14 — End: ?
  Administered 2022-08-15 (×3): 10 mg via INTRAVENOUS

## 2022-08-14 MED ORDER — MAGNESIUM HYDROXIDE 400 MG/5 ML PO SUSP
30 mL | Freq: Every day | ORAL | 0 refills | Status: DC
Start: 2022-08-14 — End: 2022-08-14

## 2022-08-14 MED ORDER — DEXTROSE 50 % IN WATER (D50W) IV SYRG
12.5-25 g | INTRAVENOUS | 0 refills | Status: AC | PRN
Start: 2022-08-14 — End: ?

## 2022-08-14 MED ORDER — SENNOSIDES-DOCUSATE SODIUM 8.6-50 MG PO TAB
1 | Freq: Two times a day (BID) | ORAL | 0 refills | Status: AC
Start: 2022-08-14 — End: ?
  Administered 2022-08-15: 02:00:00 1 via ORAL

## 2022-08-14 MED ORDER — INSULIN ASPART 100 UNIT/ML SC FLEXPEN
0-12 [IU] | Freq: Before meals | SUBCUTANEOUS | 0 refills | Status: AC
Start: 2022-08-14 — End: ?
  Administered 2022-08-14: 23:00:00 2 [IU] via SUBCUTANEOUS

## 2022-08-14 MED ORDER — FAMOTIDINE 20 MG PO TAB
20 mg | Freq: Two times a day (BID) | ORAL | 0 refills | Status: AC
Start: 2022-08-14 — End: ?
  Administered 2022-08-15 (×2): 20 mg via ORAL

## 2022-08-14 MED ORDER — ASPIRIN 325 MG PO TAB
325 mg | Freq: Every day | ORAL | 0 refills | Status: DC
Start: 2022-08-14 — End: 2022-08-14

## 2022-08-14 MED ADMIN — LABETALOL 5 MG/ML IV SYRG [86579]: 20 mg | INTRAVENOUS | @ 20:00:00 | Stop: 2022-08-14 | NDC 00409233924

## 2022-08-14 MED ADMIN — NICARDIPINE IN NACL (ISO-OS) 20 MG/200 ML (0.1 MG/ML) IV PGBK [169819]: 2.5 mg/h | INTRAVENOUS | @ 19:00:00 | Stop: 2022-08-14 | NDC 10122031301

## 2022-08-14 MED ADMIN — HYDRALAZINE 20 MG/ML IJ SOLN [3697]: 20 mg | INTRAVENOUS | @ 21:00:00 | Stop: 2022-08-14 | NDC 63323061400

## 2022-08-14 NOTE — Care Plan
Problem: Skin Integrity  Goal: Skin integrity intact  08/14/2022 1837 by Verlin Dike, RN  Outcome: Goal Ongoing  08/14/2022 1836 by Verlin Dike, RN  Outcome: Goal Ongoing  Goal: Healing of skin (Wound & Incision)  08/14/2022 1837 by Verlin Dike, RN  Outcome: Goal Ongoing  08/14/2022 1836 by Verlin Dike, RN  Outcome: Goal Ongoing   Kyle Powell vitals and neurovascular exams have remained stable. He is alert and is aware of his plan of care, as discussed with him and his wife. His urine became dark yellow, then more bloody with sediment. The team was notified and labs were sent. Continue to monitor.

## 2022-08-14 NOTE — Unmapped
Neuro Interventional Immediate Post Procedure Note    Date:  08/14/2022                                     Attending Physician:  Estevan Oaks, MD  Assistant(s):  Kathlene November  Intracranial Stenting and Extracranial Angioplasty  Time out performed: Consent obtained, correct patient verified, correct procedure verified, correct site verified, patient marked as necessary.    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    Estevan Oaks, MD  Pager:  [phone]

## 2022-08-14 NOTE — H&P (View-Only)
Neuro Critical Care History and Physical Note    Kyle Powell.  Admission Date: 08/14/2022  LOS: 0 days  No Order                      ASSESSMENT/PLAN   There are no problems to display for this patient.      Kyle Powell. is a 67 y.o. male with a PMH of R ICA stenosis, CAD s/p CABG, HLD, HTN, SSS s/p pacemaker, DMII, history of CVA and GERD who underwent intracranial stent placement of R ICA for stenosis and was admitted to the neuro ICU for monitoring.      Hospital and ICU course:   4/10: Patient with R ICA stent place, admitted to ICU for post operative monitoring.     Neuro:  Intracranial stenting of R ICA   Previous CVA without residual symptoms   History of TIA  Neuropathy, diabetic of BLE  - ICA stent placed 4/10 without complication  - R MCA stroke seen on imaging (CE: 08/2019), no known stoke incident  Plan:   - Neuro-ICU monitoring,  - neurochecks q 1 hrs  - BP goals as below     Sedation/Pain Management:   - agents: APAP prn for pain  - Assess for delirium daily    Cardiovascular:  R ICA stenosis s/p stent placement   SSS s/p Pacemaker/ICD  CHF, diastolic   CAD s/p CABG in 2018  HLD  HTN  - Echo 05/2022: LVEF 55-60%, grade II diastolic dysfunction, mod mitral and tricuspid regurgitation .  - Cardene at 5 mg/hr upon arrival from IR, weaned quickly  - BP >200 prior to procedure; normal home readings vary but usually 120-150s.   - PTA medications: atorvastatin, lisinopril    Plan:   - SBP goal: 100 - 150  - start PTA lisinopril   - labetalol and hydralazine prn  - Cardene if uncontrolled with the above medications  - start 4/10ticagrelor 90 mg bid       Respiratory:    - 05/22/22 admission for bilateral lower lobe pneumonia  - PaO2 goal >100, Spo2 goal >95%,  - Stable on room air    GI:  GERD  - Feeding: advance diet as tolerated to diabetic diet  - neuro bowel regimen, ensure daily BM  - resume PTA famotidine    Heme:   - VTE prophylaxis: Mechanical prophylaxis; Sequential compression device  - assess for coagulopathy, maintain platelets above 100k, INR <1.5    ID:   - afebrile  - aim for normothermia, Temp <38.3 celsius, normothermia protocol if febrile    Renal:  CKD stage 3  Baseline cr: ~2.3   - Monitor hourly I/O balance   - Aim for normovolemia  - BMP and electrolytes ordered     Endocrine:    DM II on insulin  -home regimen: 13 units glargine nightly; sliding scale  -  Blood glucose goal 100-180mg /dl  > MDCF     FEN:   - IVF: SL  - Critical care electrolyte replacement protocol   - Magnesium goal >2.0, i-Cal goal > 1.0, Potassium goal >4.0 mEq/L    Prophylaxis Review:   A) GI: no  B) Lines:  Yes; Arterial Line; Indication:  Continuous BP monitoring; Location:  Radial  C) Urinary Catheter:  Yes; Retain foley due to:  Need for accurate Intake and Output  D) Antibiotic Usage:  No  E) VTE:  Mechanical prophylaxis; Sequential compression  device  F) Isolation: no  G)Seizures: no  H) Restraints: Patient assessed for need for restraints.   I) Disposition/Family:    Admit to ICU    Primary service: NCC    Consults:  None    ___________________________________________________________________  SUBJECTIVE   Chief Complaint:  monitoring post R ICA stenosis    History of Present Illness: Kyle Powell. is a 67 y.o. male    Medical History:   Diagnosis Date    CAD (coronary artery disease)     CHF (congestive heart failure) (HCC)     CKD (chronic kidney disease), stage IV (HCC)     CVA (cerebral vascular accident) (HCC)     DM (diabetes mellitus) (HCC)     Hyperlipidemia     Hypertension     Pacemaker     Shortness of breath     TIA (transient ischemic attack)        Surgical History:   Procedure Laterality Date    MASS EXCISION  1985    back    CORONARY ARTERY BYPASS GRAFT  2018    X2    LITHOTRIPSY  2023    PACEMAKER PLACEMENT  02/2022    CATARACT REMOVAL      ORCHIECTOMY Left        Family history reviewed; non-contributory    Social History     Social History Narrative    Not on file           Code Status: Full Code    Decision Maker: Jodi Mourning; wife     Immunizations (includes history and patient reported):   There is no immunization history on file for this patient.        Allergies:  Iodine    Medications Prior to Admission   Medication Sig    aspirin 325 mg tablet Take one tablet by mouth daily.    atorvastatin (LIPITOR) 40 mg tablet Take one tablet by mouth at bedtime daily.    carvediloL (COREG) 12.5 mg tablet Take 1.5 tablets by mouth twice daily as needed (if systolic blood pressure (top number)  > 150 give.). Take with food.    clopiDOGreL (PLAVIX) 75 mg tablet Take one tablet by mouth daily.    famotidine (PEPCID) 20 mg tablet Take one tablet by mouth twice daily.    furosemide (LASIX) 20 mg tablet Take one tablet by mouth daily as needed (only if retaining water).    GLUCAGON IJ Inject  to area(s) as directed as Needed.    insulin aspart (U-100) (NOVOLOG FLEXPEN U-100 INSULIN) 100 unit/mL (3 mL) PEN Inject three Units under the skin three times daily with meals. PLUS sliding scale     For 10 PM dose, ONLY give sliding scale if appropriate.    insulin glargine (LANTUS SOLOSTAR U-100 INSULIN) 100 unit/mL (3 mL) subcutaneous PEN Inject thirteen Units under the skin at bedtime daily.    lisinopriL (ZESTRIL) 10 mg tablet Take one tablet by mouth daily.    metoclopramide HCL (REGLAN) 10 mg tablet Take one-half tablet by mouth four times daily as needed (hiccups).    ondansetron HCL (ZOFRAN) 4 mg tablet Take one tablet by mouth every 8 hours as needed for Nausea or Vomiting.    potassium chloride SR (K-DUR) 20 mEq tablet Take one tablet by mouth daily as needed (ONLY give if retaining water and taking the furosemide (Laix)).    predniSONE (DELTASONE) 50 mg tablet Take one tablet by mouth daily. Take 1  tab PO 3/4 at 2300 and 1 tab PO 3/5 at 0900 for contrast allergy    SITagliptin phosphate (JANUVIA) 25 mg tablet Take one tablet by mouth daily.    tamsulosin (FLOMAX) 0.4 mg capsule Take one capsule by mouth at bedtime daily. Do not crush, chew or open capsules. Take 30 minutes following the same meal each day.       Review of Systems:  A 14 point review of systems was negative except for: Gastrointestinal: positive for diarrhea      OBJECTIVE                     Vital Signs: Last Filed                  Vital Signs: 24 Hour Range   BP: 191/81 (04/10 0915)  Temp: 36.8 ?C (98.2 ?F) (04/10 0915)  Pulse: 96 (04/10 0915)  Respirations: 19 PER MINUTE (04/10 0915)  SpO2: 97 % (04/10 0915)  O2 Device: None (Room air) (04/10 0915)  Height: 170.2 cm (5' 7) (04/10 0915)  Weight: 70.8 kg (156 lb) (04/10 0915)  BP: (191)/(81)   Temp:  [36.8 ?C (98.2 ?F)]   Pulse:  [96-108]   Respirations:  [14 PER MINUTE-19 PER MINUTE]   SpO2:  [96 %-97 %]   O2 Device: None (Room air)    Intensity Pain Scale (Self Report): (not recorded) Vitals:    08/14/22 0915   Weight: 70.8 kg (156 lb)           Lines:  Arterial Line and Peripheral Line  Drains: Other foley      Intake/Output Summary:  (Last 24 hours)  No intake or output data in the 24 hours ending 08/14/22 1347         Physical Exam:    Blood pressure (!) 191/81, pulse 60, temperature 36.4 ?C (97.6 ?F), height 170.2 cm (5' 7), weight 70.8 kg (156 lb), SpO2 97%.    Glasgow coma score:              E: 4 - Opens eyes on own          M: 6 - Follows simple motor commands              V: 5 - Alert and oriented  Neuro:   Mental Status: alert and oriented x 4   Cranial Nerves:       - Pupil exam: Size:         ~2 b/l               Reactivity: brisk    - EOM: intact    - Grimace/facial movement: present     - Cough: present    - Gag reflex:  deferred    Motor:         RUE: Strength: 4/5; follows commands in all extrmities                   RLE: Strength: 4/5;             LUE: Strength: 4/5;          LLE: Strength: 4/5;    Sensory: mild distal sensory loss of bilateral LE   Coordination: intact to finger to nose         Lungs: clear to auscultation bilaterally                     Pulmonary:  Respiratory  status:   Stable    Heart: regular rate and rhythm, S1, S2 normal, no murmur, click, rub or gallop  Abdomen:  nondistended  Extremities: extremities normal, atraumatic, no cyanosis or edema  Skin: Skin color, texture, turgor normal. No rashes or lesions    Point of Care Testing:  (Last 24 hours):  POC Glucose (Download): (!) 152 (08/14/22 1334)    Lab Review:  Pertinent labs reviewed    Radiology and Other Diagnostic Procedures Review:  Pertinent radiologic and diagnostic procedures reviewed.        Calvert Cantor, DO Date:  08/14/2022   [phone]

## 2022-08-14 NOTE — Progress Notes
Pt arrived to IR pre/post for procedure work up.     Mobility status: Ambulatory  Procedure and discharge instructions reviewed and pt verbalizes understanding.   Pt cart locked in low position, call light within reach.   See doc flowsheets for further details.    Will continue to monitor patient.

## 2022-08-14 NOTE — H&P (View-Only)
IR Pre-Procedure History and Physical/Sedation Plan    Procedure Date: 08/14/2022     Planned Procedure(s):  Cerebral arteriogram with carotid stenting    Procedural code status: No Order    Indication:  carotid stenosis   __________________________________________________________________    Chief Complaint:  see above    History of Present Illness: Kyle Powell. is a 67 y.o. male with a history as listed below who presents today for procedure.    There are no problems to display for this patient.    Medical History:   Diagnosis Date    CAD (coronary artery disease)     CHF (congestive heart failure) (HCC)     CKD (chronic kidney disease), stage IV (HCC)     CVA (cerebral vascular accident) (HCC)     DM (diabetes mellitus) (HCC)     Hyperlipidemia     Hypertension     Pacemaker     Shortness of breath     TIA (transient ischemic attack)       Surgical History:   Procedure Laterality Date    MASS EXCISION  1985    back    CORONARY ARTERY BYPASS GRAFT  2018    X2    LITHOTRIPSY  2023    PACEMAKER PLACEMENT  02/2022    CATARACT REMOVAL      ORCHIECTOMY Left       Social History     Tobacco Use    Smoking status: Never    Smokeless tobacco: Never   Substance Use Topics    Alcohol use: Never      No family history on file.   Medications Prior to Admission   Medication Sig Dispense Refill Last Dose    aspirin 325 mg tablet Take one tablet by mouth daily. 90 tablet 3     atorvastatin (LIPITOR) 40 mg tablet Take one tablet by mouth at bedtime daily.       carvediloL (COREG) 12.5 mg tablet Take 1.5 tablets by mouth twice daily as needed (if systolic blood pressure (top number)  > 150 give.). Take with food.       clopiDOGreL (PLAVIX) 75 mg tablet Take one tablet by mouth daily. 90 tablet 3     famotidine (PEPCID) 20 mg tablet Take one tablet by mouth twice daily.       furosemide (LASIX) 20 mg tablet Take one tablet by mouth daily as needed (only if retaining water).       GLUCAGON IJ Inject  to area(s) as directed as Needed.       insulin aspart (U-100) (NOVOLOG FLEXPEN U-100 INSULIN) 100 unit/mL (3 mL) PEN Inject three Units under the skin three times daily with meals. PLUS sliding scale     For 10 PM dose, ONLY give sliding scale if appropriate.       insulin glargine (LANTUS SOLOSTAR U-100 INSULIN) 100 unit/mL (3 mL) subcutaneous PEN Inject thirteen Units under the skin at bedtime daily.       lisinopriL (ZESTRIL) 10 mg tablet Take one tablet by mouth daily.       metoclopramide HCL (REGLAN) 10 mg tablet Take one-half tablet by mouth four times daily as needed (hiccups).       ondansetron HCL (ZOFRAN) 4 mg tablet Take one tablet by mouth every 8 hours as needed for Nausea or Vomiting.       potassium chloride SR (K-DUR) 20 mEq tablet Take one tablet by mouth daily as needed (ONLY give if retaining  water and taking the furosemide (Laix)).       predniSONE (DELTASONE) 50 mg tablet Take one tablet by mouth daily. Take 1 tab PO 3/4 at 2300 and 1 tab PO 3/5 at 0900 for contrast allergy 2 tablet 0     SITagliptin phosphate (JANUVIA) 25 mg tablet Take one tablet by mouth daily.       tamsulosin (FLOMAX) 0.4 mg capsule Take one capsule by mouth at bedtime daily. Do not crush, chew or open capsules. Take 30 minutes following the same meal each day.        Allergies   Allergen Reactions    Iodine HIVES       Review of Systems  Constitutional: negative for fevers and chills  Respiratory: negative  Cardiovascular: negative  Gastrointestinal: negative for nausea and vomiting    Previous Personal Anesthetic/Sedation History:  Denies adverse events related to sedation/anesthesia.     Previous Family Anesthetic/Sedation History: Denies adverse events related to sedation/anesthesia.    Physical Exam:  Vital Signs: Last Filed In 24 Hours Vital Signs: 24 Hour Range   Pulse: 108 (04/10 0910)  Respirations: 14 PER MINUTE (04/10 0910)  SpO2: 96 % (04/10 0910) Pulse:  [108]   Respirations:  [14 PER MINUTE]   SpO2:  [96 %]           General appearance: Alert and no distress noted.  Neurologic: Grossly normal. 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  Heart: Regular rate and rhythm  Abdomen: Non-distended    Pre-procedure anxiolysis plan: Per Anesthesia  Sedation/Medication Plan: Per Anesthesia  Personal history of sedation complications: Per Anesthesia  Family history of sedation complications: Per Anesthesia  Medications for Reversal: Per Anesthesia  Discussion/Reviews:  Physician has discussed risks and alternatives of this type of sedation and above planned procedures with patient    NPO Status: Acceptable  Airway:  Per Anesthesia  Head and Neck: Per Anesthesia  Mouth: Per Anesthesia   Anesthesia Classification:  Per Anesthesia  Pregnancy Status: N/A      Lab/Radiology/Other Diagnostic Tests:  Labs:  Pertinent labs reviewed and P2Y pending           Dollene Primrose, APRN-NP  Pager [phone]

## 2022-08-14 NOTE — Progress Notes
Anesthesia staff present to monitor patient airway, vital signs, and medications. See anesthesia docflow. This RN will assist as needed.

## 2022-08-15 ENCOUNTER — Encounter: Admit: 2022-08-15 | Discharge: 2022-08-15 | Payer: MEDICARE

## 2022-08-15 MED ADMIN — METOCLOPRAMIDE HCL 5 MG PO TAB [5006]: 5 mg | ORAL | @ 16:00:00 | Stop: 2022-08-15 | NDC 60687062011

## 2022-08-15 MED ADMIN — LACTATED RINGERS IV SOLP [4318]: 500 mL | INTRAVENOUS | @ 11:00:00 | Stop: 2022-08-15 | NDC 00338011704

## 2022-08-15 NOTE — Telephone Encounter
faxed a copy of Dr Darin Engels IR note to Dr [facility] at [phone]  Confirmation received

## 2022-08-20 ENCOUNTER — Encounter: Admit: 2022-08-20 | Discharge: 2022-08-20 | Payer: MEDICARE

## 2022-08-20 NOTE — Telephone Encounter
Per Dr. Sherran Needs request Interventional Radiology dictation sent to Dr. Curt Bears office at:  [address], [place], [place] [address]

## 2022-08-21 ENCOUNTER — Encounter: Admit: 2022-08-21 | Discharge: 2022-08-21 | Payer: MEDICARE

## 2022-10-15 ENCOUNTER — Encounter: Admit: 2022-10-15 | Discharge: 2022-10-15 | Payer: MEDICARE

## 2022-10-22 ENCOUNTER — Ambulatory Visit: Admit: 2022-10-22 | Discharge: 2022-10-23 | Payer: MEDICARE

## 2022-10-22 ENCOUNTER — Encounter: Admit: 2022-10-22 | Discharge: 2022-10-22 | Payer: MEDICARE

## 2022-10-22 DIAGNOSIS — R0602 Shortness of breath: Secondary | ICD-10-CM

## 2022-10-22 DIAGNOSIS — G459 Transient cerebral ischemic attack, unspecified: Secondary | ICD-10-CM

## 2022-10-22 DIAGNOSIS — I509 Heart failure, unspecified: Secondary | ICD-10-CM

## 2022-10-22 DIAGNOSIS — E785 Hyperlipidemia, unspecified: Secondary | ICD-10-CM

## 2022-10-22 DIAGNOSIS — E119 Type 2 diabetes mellitus without complications: Secondary | ICD-10-CM

## 2022-10-22 DIAGNOSIS — I63239 Cerebral infarction due to unspecified occlusion or stenosis of unspecified carotid arteries: Secondary | ICD-10-CM

## 2022-10-22 DIAGNOSIS — I63131 Cerebral infarction due to embolism of right carotid artery: Secondary | ICD-10-CM

## 2022-10-22 DIAGNOSIS — I639 Cerebral infarction, unspecified: Secondary | ICD-10-CM

## 2022-10-22 DIAGNOSIS — N184 Chronic kidney disease, stage 4 (severe): Secondary | ICD-10-CM

## 2022-10-22 DIAGNOSIS — Z950 Presence of cardiac pacemaker: Secondary | ICD-10-CM

## 2022-10-22 DIAGNOSIS — I2510 Atherosclerotic heart disease of native coronary artery without angina pectoris: Secondary | ICD-10-CM

## 2022-10-22 DIAGNOSIS — I10 Essential (primary) hypertension: Secondary | ICD-10-CM

## 2022-10-22 NOTE — Progress Notes
Date of Service: 10/22/2022    Subjective:             Kyle Powell. is a 67 y.o. male who presents to clinic with his wife as a telehealth f/u for carotid artery stenting.    History of Present Illness  Since stenting has been more active.  Doing a lot of walking.    Has a cardio meme which found fluid around his heart and lasix was started.    Had a carotid duplex a few wks stenting.                   Objective:         aspirin EC (ASPIR-LOW) 81 mg tablet Take one tablet by mouth daily. Indications: Internal carotid artery stent    atorvastatin (LIPITOR) 40 mg tablet Take one tablet by mouth at bedtime daily.    carvediloL (COREG) 12.5 mg tablet Take 1.5 tablets by mouth twice daily as needed (if systolic blood pressure (top number)  > 150 give.). Take with food.    famotidine (PEPCID) 20 mg tablet Take one tablet by mouth twice daily.    FARXIGA 10 mg tablet Take one tablet by mouth daily.    furosemide (LASIX) 20 mg tablet Take one tablet by mouth daily as needed (only if retaining water).    GLUCAGON IJ Inject  to area(s) as directed as Needed.    insulin aspart (U-100) (NOVOLOG FLEXPEN U-100 INSULIN) 100 unit/mL (3 mL) PEN Inject three Units under the skin three times daily with meals. PLUS sliding scale     For 10 PM dose, ONLY give sliding scale if appropriate.    insulin glargine (LANTUS SOLOSTAR U-100 INSULIN) 100 unit/mL (3 mL) subcutaneous PEN Inject thirteen Units under the skin at bedtime daily.    lisinopriL (ZESTRIL) 10 mg tablet Take one tablet by mouth daily.    metoclopramide HCL (REGLAN) 10 mg tablet Take one-half tablet by mouth four times daily as needed (hiccups).    ondansetron HCL (ZOFRAN) 4 mg tablet Take one tablet by mouth every 8 hours as needed for Nausea or Vomiting.    potassium chloride SR (K-DUR) 20 mEq tablet Take one tablet by mouth daily as needed (ONLY give if retaining water and taking the furosemide (Laix)).    prasugreL (EFFIENT) 5 mg tablet Take one tablet by mouth daily. Indications: blood clot prevention following percutaneous coronary intervention    tamsulosin (FLOMAX) 0.4 mg capsule Take one capsule by mouth at bedtime daily. Do not crush, chew or open capsules. Take 30 minutes following the same meal each day.     Vitals:    10/22/22 0955   PainSc: Zero   Weight: 68 kg (150 lb)   Height: 170.2 cm (5' 7)     Body mass index is 23.49 kg/m?Marland Kitchen     Physical Exam  Neuro:  MS:  A & O X 3, Speech:  fluent, CN:  2-12 intact, Motor:  moving bilat UE without difficulty         NEUROIMAGING  NEUROIMAGING  CEREBRAL ANGIOGRAM WITH CAROTID ANGIOPLASTY AND STENTING (08-14-22)      IMPRESSION     1. Flow-limiting stenosis in the origin of the right internal carotid   artery measuring 75% using the NASCET criteria.     2.  Status post endovascular carotid artery balloon angioplasty and   stenting, as described above, with resolution of the stenosis and more   robust filling of the  right anterior intracranial circulation.             Assessment and Plan:  1) Symptomatic RICA Stenosis - s/p endovascular carotid artery angioplasty and stenting with the key images highlighted above.  He has been doing well since treatment with 3 wk f/u carotid duplex by Kyle Powell showing normal flow.  I will follow his carotid arteries with a bilateral carotid duplex in July 2024.  If normal at that time, he can stop the brilinta and continue asa 81 mg po qday.  If stable, then will do a yearly bilateral carotid duplex.  I will ask Kyle Powell to perform the carotid duplex studies.    -Risk factor modification per PCP   -Goal systolic BP <130   -Goal LDL <100   -Goal A1C <7      RTC in 1 year.                          Total time 25 minutes.  Estimated counseling time 20 minutes.  Counseled Kyle Powell regarding carotid stenosis .

## 2022-12-03 ENCOUNTER — Encounter: Admit: 2022-12-03 | Discharge: 2022-12-03 | Payer: MEDICARE

## 2022-12-09 ENCOUNTER — Encounter: Admit: 2022-12-09 | Discharge: 2022-12-09 | Payer: MEDICARE

## 2022-12-09 NOTE — Telephone Encounter
Spoke with Jodi Mourning this morning.  Kyle Powell is ready for a refill on his anticoagulant and before they do this she is asking does he still need to take it?  In LOV notes it indicates Dr. Darin Engels would review his imaging (US duplex scan) now completed and scanned to Dr. Sherran Needs email and make that decision.  Patient is waiting for answer.  Routing to Dr. Darin Engels.

## 2023-01-16 ENCOUNTER — Encounter: Admit: 2023-01-16 | Discharge: 2023-01-16 | Payer: MEDICARE

## 2023-11-29 ENCOUNTER — Encounter: Admit: 2023-11-29 | Discharge: 2023-11-29 | Payer: MEDICARE
